# Patient Record
Sex: Female | Born: 1980
Health system: Southern US, Community
[De-identification: ages and names within clinical notes are randomized; demographics above are authoritative.]

## PROBLEM LIST (undated history)

## (undated) ENCOUNTER — Inpatient Hospital Stay (HOSPITAL_COMMUNITY): Payer: Self-pay

## (undated) DIAGNOSIS — R519 Headache, unspecified: Secondary | ICD-10-CM

## (undated) DIAGNOSIS — G5603 Carpal tunnel syndrome, bilateral upper limbs: Secondary | ICD-10-CM

## (undated) DIAGNOSIS — E785 Hyperlipidemia, unspecified: Secondary | ICD-10-CM

## (undated) DIAGNOSIS — N200 Calculus of kidney: Secondary | ICD-10-CM

## (undated) DIAGNOSIS — I10 Essential (primary) hypertension: Secondary | ICD-10-CM

## (undated) DIAGNOSIS — F419 Anxiety disorder, unspecified: Secondary | ICD-10-CM

## (undated) DIAGNOSIS — F319 Bipolar disorder, unspecified: Secondary | ICD-10-CM

## (undated) DIAGNOSIS — E7800 Pure hypercholesterolemia, unspecified: Secondary | ICD-10-CM

## (undated) DIAGNOSIS — E119 Type 2 diabetes mellitus without complications: Secondary | ICD-10-CM

## (undated) HISTORY — DX: Hyperlipidemia, unspecified: E78.5

## (undated) HISTORY — DX: Anxiety disorder, unspecified: F41.9

## (undated) HISTORY — DX: Calculus of kidney: N20.0

## (undated) HISTORY — PX: KIDNEY STONE SURGERY: SHX686

## (undated) HISTORY — DX: Carpal tunnel syndrome, bilateral upper limbs: G56.03

## (undated) HISTORY — DX: Type 2 diabetes mellitus without complications: E11.9

---

## 1998-02-16 ENCOUNTER — Other Ambulatory Visit: Admission: RE | Admit: 1998-02-16 | Discharge: 1998-02-16 | Payer: Self-pay | Admitting: Obstetrics

## 1998-03-02 ENCOUNTER — Encounter: Admission: RE | Admit: 1998-03-02 | Discharge: 1998-05-31 | Payer: Self-pay | Admitting: Obstetrics

## 1998-05-17 ENCOUNTER — Other Ambulatory Visit: Admission: RE | Admit: 1998-05-17 | Discharge: 1998-05-17 | Payer: Self-pay | Admitting: Obstetrics and Gynecology

## 1998-06-21 ENCOUNTER — Ambulatory Visit (HOSPITAL_COMMUNITY): Admission: RE | Admit: 1998-06-21 | Discharge: 1998-06-21 | Payer: Self-pay | Admitting: Obstetrics and Gynecology

## 1998-09-12 ENCOUNTER — Inpatient Hospital Stay (HOSPITAL_COMMUNITY): Admission: AD | Admit: 1998-09-12 | Discharge: 1998-09-12 | Payer: Self-pay | Admitting: Obstetrics and Gynecology

## 1998-09-15 ENCOUNTER — Inpatient Hospital Stay (HOSPITAL_COMMUNITY): Admission: AD | Admit: 1998-09-15 | Discharge: 1998-09-15 | Payer: Self-pay | Admitting: Obstetrics and Gynecology

## 1998-09-19 ENCOUNTER — Inpatient Hospital Stay (HOSPITAL_COMMUNITY): Admission: AD | Admit: 1998-09-19 | Discharge: 1998-09-20 | Payer: Self-pay | Admitting: Obstetrics and Gynecology

## 1998-09-19 ENCOUNTER — Inpatient Hospital Stay (HOSPITAL_COMMUNITY): Admission: AD | Admit: 1998-09-19 | Discharge: 1998-09-22 | Payer: Self-pay | Admitting: Obstetrics and Gynecology

## 1998-11-01 ENCOUNTER — Other Ambulatory Visit: Admission: RE | Admit: 1998-11-01 | Discharge: 1998-11-01 | Payer: Self-pay

## 2000-11-18 ENCOUNTER — Other Ambulatory Visit: Admission: RE | Admit: 2000-11-18 | Discharge: 2000-11-18 | Payer: Self-pay | Admitting: *Deleted

## 2002-05-25 ENCOUNTER — Other Ambulatory Visit: Admission: RE | Admit: 2002-05-25 | Discharge: 2002-05-25 | Payer: Self-pay | Admitting: Family Medicine

## 2003-05-04 ENCOUNTER — Emergency Department (HOSPITAL_COMMUNITY): Admission: EM | Admit: 2003-05-04 | Discharge: 2003-05-04 | Payer: Self-pay | Admitting: Emergency Medicine

## 2003-05-04 ENCOUNTER — Encounter: Payer: Self-pay | Admitting: Emergency Medicine

## 2003-05-10 ENCOUNTER — Emergency Department (HOSPITAL_COMMUNITY): Admission: EM | Admit: 2003-05-10 | Discharge: 2003-05-10 | Payer: Self-pay | Admitting: Emergency Medicine

## 2003-05-12 ENCOUNTER — Ambulatory Visit (HOSPITAL_BASED_OUTPATIENT_CLINIC_OR_DEPARTMENT_OTHER): Admission: RE | Admit: 2003-05-12 | Discharge: 2003-05-12 | Payer: Self-pay | Admitting: Urology

## 2003-05-12 ENCOUNTER — Encounter: Payer: Self-pay | Admitting: Urology

## 2003-05-21 ENCOUNTER — Encounter: Payer: Self-pay | Admitting: Emergency Medicine

## 2003-05-21 ENCOUNTER — Ambulatory Visit (HOSPITAL_COMMUNITY): Admission: EM | Admit: 2003-05-21 | Discharge: 2003-05-22 | Payer: Self-pay | Admitting: Emergency Medicine

## 2003-05-27 ENCOUNTER — Emergency Department (HOSPITAL_COMMUNITY): Admission: EM | Admit: 2003-05-27 | Discharge: 2003-05-28 | Payer: Self-pay | Admitting: Emergency Medicine

## 2003-05-28 ENCOUNTER — Encounter: Payer: Self-pay | Admitting: Emergency Medicine

## 2004-09-20 ENCOUNTER — Other Ambulatory Visit: Admission: RE | Admit: 2004-09-20 | Discharge: 2004-09-20 | Payer: Self-pay | Admitting: Family Medicine

## 2005-03-12 ENCOUNTER — Other Ambulatory Visit: Admission: RE | Admit: 2005-03-12 | Discharge: 2005-03-12 | Payer: Self-pay | Admitting: Family Medicine

## 2005-12-26 ENCOUNTER — Other Ambulatory Visit: Admission: RE | Admit: 2005-12-26 | Discharge: 2005-12-26 | Payer: Self-pay | Admitting: Obstetrics and Gynecology

## 2008-12-23 ENCOUNTER — Encounter: Payer: Self-pay | Admitting: Internal Medicine

## 2008-12-23 ENCOUNTER — Ambulatory Visit: Payer: Self-pay | Admitting: Family Medicine

## 2008-12-23 DIAGNOSIS — N949 Unspecified condition associated with female genital organs and menstrual cycle: Secondary | ICD-10-CM

## 2008-12-23 DIAGNOSIS — N925 Other specified irregular menstruation: Secondary | ICD-10-CM | POA: Insufficient documentation

## 2008-12-23 DIAGNOSIS — IMO0002 Reserved for concepts with insufficient information to code with codable children: Secondary | ICD-10-CM | POA: Insufficient documentation

## 2008-12-23 LAB — CONVERTED CEMR LAB: Beta hcg, urine, semiquantitative: POSITIVE

## 2009-07-17 ENCOUNTER — Ambulatory Visit: Payer: Self-pay | Admitting: Family Medicine

## 2009-07-17 LAB — CONVERTED CEMR LAB: Rapid Strep: NEGATIVE

## 2009-08-25 ENCOUNTER — Inpatient Hospital Stay (HOSPITAL_COMMUNITY): Admission: RE | Admit: 2009-08-25 | Discharge: 2009-08-27 | Payer: Self-pay | Admitting: Obstetrics and Gynecology

## 2009-08-30 ENCOUNTER — Ambulatory Visit: Admission: RE | Admit: 2009-08-30 | Discharge: 2009-08-30 | Payer: Self-pay | Admitting: Obstetrics and Gynecology

## 2009-12-29 ENCOUNTER — Ambulatory Visit: Payer: Self-pay | Admitting: Family Medicine

## 2009-12-29 LAB — CONVERTED CEMR LAB
Cholesterol: 219 mg/dL — ABNORMAL HIGH (ref 0–200)
Direct LDL: 171.2 mg/dL
HDL: 41.4 mg/dL (ref >39.00)
Total CHOL/HDL Ratio: 5
Triglycerides: 116 mg/dL (ref 0.0–149.0)
VLDL: 23.2 mg/dL (ref 0.0–40.0)

## 2010-01-30 ENCOUNTER — Ambulatory Visit: Payer: Self-pay | Admitting: Family Medicine

## 2010-01-30 ENCOUNTER — Emergency Department (HOSPITAL_COMMUNITY): Admission: EM | Admit: 2010-01-30 | Discharge: 2010-01-30 | Payer: Self-pay | Admitting: Emergency Medicine

## 2010-01-30 DIAGNOSIS — R1031 Right lower quadrant pain: Secondary | ICD-10-CM | POA: Insufficient documentation

## 2010-03-26 ENCOUNTER — Ambulatory Visit: Payer: Self-pay | Admitting: Family Medicine

## 2010-11-15 NOTE — Assessment & Plan Note (Signed)
Summary: RASH ON HAND? / SPREADING // RS   Vital Signs:  Patient profile:   30 year old female Weight:      180 pounds Temp:     98.7 degrees F oral BP sitting:   112 / 84  (left arm) Cuff size:   regular  Vitals Entered By: Kern Reap CMA Duncan Dull) (March 26, 2010 5:16 PM) CC: rash on hands   CC:  rash on hands.  History of Present Illness: Retta is a 30 year old, married female, who  comes in today accompanied by her newborn baby.for evaluation of a skin rash.  She says the past couple weeks.  A skin rash on her wrist and I left on.  Some of the lesions on her wrist.  She said will be small vesicles with clear fluid and a p.o. and a very pruritic.  She's had no history of previous rashes like this before.  No history of contact dermatitis.  No history of allergic rhinitis.  Allergies: No Known Drug Allergies  Social History: Reviewed history from 01/30/2010 and no changes required. 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 Apply two times a day small amts  Complete Medication List: 1)  Multi-b-plus Tabs (Multiple vitamins-minerals) .... As needed 2)  Tri-sprintec 0.18/0.215/0.25 Mg-35 Mcg Tabs (Norgestim-eth estrad triphasic) .... Once daily 3)  Triamcinolone Acetonide 0.5 % Oint (Triamcinolone acetonide) ....  Apply two times a day small amts  Patient Instructions: 1)  apply small amounts of the Lidex twice daily.  Return p.r.n. Prescriptions: TRIAMCINOLONE ACETONIDE 0.5 % OINT (TRIAMCINOLONE ACETONIDE) apply two times a day small amts  #60 gr x 2   Entered and Authorized by:   Roderick Pee MD   Signed by:   Roderick Pee MD on 03/26/2010   Method used:   Electronically to        [facility]. [phone]* (retail)       [address]       [place], [place]  [address]       Ph: [phone]       Fax: [phone]   RxID:   [id number]

## 2010-11-15 NOTE — Assessment & Plan Note (Signed)
Summary: follow up on cholesterol/pt coming in fasting/cjr   Vital Signs:  Patient profile:   30 year old female Temp:     99.0 degrees F oral BP sitting:   110 / 82  (left arm) Cuff size:   regular  Vitals Entered By: Sid Falcon LPN (December 29, 2009 8:32 AM) CC: follow-up visit   History of Present Illness: Patient here to have cholesterol check.  Her concern is that her mother died age 15 of MI. Patient does smoke less than one half pack cigarettes per day. No regular exercise yet but plans to start. No history of hypertension. Borderline gestational diabetes. Denies any chest pains or other complaints. Also has some uncles and grandparents father side of family who had coronary disease.  Allergies (verified): No Known Drug Allergies  Past History:  Past Medical History: Last updated: 12/23/2008 Kidney stones 2005  Family History: Last updated: 12/29/2009 Family History High cholesterol Mother MI age 26  Social History: Last updated: 12/23/2008 Occupation: Married Alcohol use-no ex-smoker Drug use-no  Risk Factors: Alcohol Use: 0 (12/23/2008)  Risk Factors: Smoking Status: never (12/23/2008) PMH-FH-SH reviewed for relevance  Family History: Family History High cholesterol Mother MI age 70  Review of Systems  The patient denies chest pain, syncope, dyspnea on exertion, peripheral edema, prolonged cough, headaches, hemoptysis, abdominal pain, and hematochezia.    Physical Exam  General:  Well-developed,well-nourished,in no acute distress; alert,appropriate and cooperative throughout examination Neck:  No deformities, masses, or tenderness noted. Lungs:  Normal respiratory effort, chest expands symmetrically. Lungs are clear to auscultation, no crackles or wheezes. Heart:  Normal rate and regular rhythm. S1 and S2 normal without gallop, murmur, click, rub or other extra sounds.   Impression & Recommendations:  Problem # 1:  CORONARY ARTERY DISEASE,  PREMATURE, FAMILY HX (ICD-V17.3) Needs fasting baseline lipids and will obtain today.  Councelled regarding modifiable risk factors for CAD.  Information given regarding lowering saturated/trans fats in diet. Orders: Venipuncture (16109) TLB-Lipid Panel (80061-LIPID)  Problem # 2:  PERS HX TOBACCO USE PRESENTING HAZARDS HEALTH (ICD-V15.82) Counselled regading quitting.  Complete Medication List: 1)  Multi-b-plus Tabs (Multiple vitamins-minerals) .... As needed  Patient Instructions: 1)  It is important that you exercise reguarly at least 20 minutes 5 times a week. If you develop chest pain, have severe difficulty breathing, or feel very tired, stop exercising immediately and seek medical attention.  2)  You need to lose weight. Consider a lower calorie diet and regular exercise.

## 2010-11-15 NOTE — Assessment & Plan Note (Signed)
Summary: BRAND NEW PT/TO EST/OK PER DR BURCHETTE AND DR FRY/CJR   Vital Signs:  Patient profile:   30 year old female Weight:      179 pounds BMI:     35.08 Temp:     98.1 degrees F oral Pulse rate:   84 / minute Pulse rhythm:   regular BP sitting:   98 / 76  (left arm) Cuff size:   regular  Vitals Entered By: Raechel Ache, RN (January 30, 2010 2:44 PM) CC: To Establish. Woke up today with severe abd pain; went to ER @ [facility]; had labs done. Pain gone now.   History of Present Illness: 30 yr old female to establish with [place] and to follow up on abdominal pain. She woke up early this am with the sudden onset of sharp RLQ abdominal pain that she has never felt before. Had a full work up at [facility] ER including labs and a contrasted abdominal and pelvic CT scan. Nothing was found, and specifically no kidney stones were seen and he rappendix was normal. he had no fever or nausea, no urinary or bowel changes, and her appetite has been normal. Her LMP was 01-13-10. She gave birth 5 months ago after an unremarkable pregnancy. She breast fed for 3 months and then stopped. Later this am the pain went away, and she has felt fine now all afternoon. She ate a normal lunch.   Allergies (verified): No Known Drug Allergies  Past History:  Past Medical History: Kidney stones 2005 SVD times 2  sees Dr. Ambrose Mantle for GYN exams  Past Surgical History: basket retrieval of kidney stones 2005  Family History: Reviewed history from 12/29/2009 and no changes required. Family History High cholesterol Mother MI age 37  Social History: Reviewed history from 12/23/2008 and no changes required. 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    Physical Exam  General:  Well-developed,well-nourished,in no acute distress; alert,appropriate and cooperative throughout examination Neck:  No deformities, masses, or tenderness noted. Lungs:  Normal respiratory effort, chest expands symmetrically. Lungs are clear to auscultation, no crackles or wheezes. Heart:  Normal rate and regular rhythm. S1 and S2 normal without gallop, murmur, click, rub or other extra sounds. Abdomen:  Bowel sounds positive,abdomen soft and non-tender without masses, organomegaly or hernias noted. Pulses:  R and L carotid,radial,femoral,dorsalis pedis and posterior tibial pulses are full and equal bilaterally Extremities:  No clubbing, cyanosis, edema, or deformity noted with normal full range of motion of all joints.   Neurologic:  alert & oriented X3, cranial nerves II-XII intact, and gait normal.     Impression & Recommendations:  Problem # 1:  ABDOMINAL PAIN,  RIGHT LOWER QUADRANT (ICD-789.03)  Complete Medication List: 1)  Multi-b-plus Tabs (Multiple vitamins-minerals) .... As needed 2)  Tri-sprintec 0.18/0.215/0.25 Mg-35 Mcg Tabs (Norgestim-eth estrad triphasic) .... Once daily  Patient Instructions: 1)  I think this pain was ovulation pain, and she possibly had a small ovarian cyst that ruptured. She seems fine now.  2)  Please schedule a follow-up appointment as needed .

## 2010-12-05 ENCOUNTER — Encounter: Payer: Self-pay | Admitting: Family Medicine

## 2010-12-05 ENCOUNTER — Ambulatory Visit (INDEPENDENT_AMBULATORY_CARE_PROVIDER_SITE_OTHER): Payer: Managed Care, Other (non HMO) | Admitting: Family Medicine

## 2010-12-05 DIAGNOSIS — J069 Acute upper respiratory infection, unspecified: Secondary | ICD-10-CM

## 2010-12-05 DIAGNOSIS — N760 Acute vaginitis: Secondary | ICD-10-CM

## 2010-12-05 MED ORDER — FLUCONAZOLE 150 MG PO TABS: 150.0000 mg | ORAL_TABLET | Freq: Once | ORAL | Status: DC

## 2010-12-05 NOTE — Progress Notes (Signed)
  Subjective:    Patient ID: Andrea [name], female    DOB: 11-21-1980, 30 y.o.   MRN: [id number]  HPI Here for 3 days of a bad ST with some sinus congestion and dry cough. No fever. The ST was severe for the first 24 hours, but now is getting better. Her child is getting over a similar set of symptoms.    Review of Systems  Constitutional: Negative.   HENT: Positive for sore throat, postnasal drip and sinus pressure.   Eyes: Negative.   Respiratory: Positive for cough. Negative for apnea, choking, chest tightness, shortness of breath, wheezing and stridor.        Objective:   Physical Exam  Constitutional: She appears well-developed and well-nourished. No distress.  HENT:  Head: Normocephalic and atraumatic.  Right Ear: External ear normal.  Left Ear: External ear normal.  Nose: Nose normal.  Mouth/Throat: Oropharynx is clear and moist. No oropharyngeal exudate.  Eyes: Conjunctivae and EOM are normal. Pupils are equal, round, and reactive to light.  Neck: Normal range of motion. Neck supple.  Pulmonary/Chest: Effort normal and breath sounds normal. No respiratory distress. She has no wheezes. She has no rales. She exhibits no tenderness.  Lymphadenopathy:    She has no cervical adenopathy.          Assessment & Plan:  This is viral, and she seems to be  over the worst of it already.

## 2011-01-01 LAB — BASIC METABOLIC PANEL
BUN: 16 mg/dL (ref 6–23)
CO2: 28 mEq/L (ref 19–32)
Calcium: 8.7 mg/dL (ref 8.4–10.5)
Chloride: 108 mEq/L (ref 96–112)
Creatinine, Ser: 0.8 mg/dL (ref 0.4–1.2)
GFR calc Af Amer: >60 mL/min (ref >60)
GFR calc non Af Amer: >60 mL/min (ref >60)
Glucose, Bld: 93 mg/dL (ref 70–99)
Potassium: 4.8 mEq/L (ref 3.5–5.1)
Sodium: 139 mEq/L (ref 135–145)

## 2011-01-01 LAB — URINALYSIS, ROUTINE W REFLEX MICROSCOPIC
Bilirubin Urine: NEGATIVE
Glucose, UA: NEGATIVE mg/dL
Hgb urine dipstick: NEGATIVE
Nitrite: NEGATIVE
Protein, ur: NEGATIVE mg/dL
Specific Gravity, Urine: >1.046 — ABNORMAL HIGH (ref 1.005–1.030)
Urobilinogen, UA: 0.2 mg/dL (ref 0.0–1.0)
pH: 5.5 (ref 5.0–8.0)

## 2011-01-01 LAB — CBC
HCT: 38.1 % (ref 36.0–46.0)
Hemoglobin: 12.9 g/dL (ref 12.0–15.0)
MCHC: 33.9 g/dL (ref 30.0–36.0)
MCV: 87.5 fL (ref 78.0–100.0)
Platelets: 253 10*3/uL (ref 150–400)
RBC: 4.35 MIL/uL (ref 3.87–5.11)
RDW: 13.2 % (ref 11.5–15.5)
WBC: 10.5 10*3/uL (ref 4.0–10.5)

## 2011-01-01 LAB — DIFFERENTIAL
Basophils Absolute: 0.1 10*3/uL (ref 0.0–0.1)
Basophils Relative: 1 % (ref 0–1)
Eosinophils Absolute: 0.2 10*3/uL (ref 0.0–0.7)
Eosinophils Relative: 2 % (ref 0–5)
Lymphocytes Relative: 20 % (ref 12–46)
Lymphs Abs: 2.1 10*3/uL (ref 0.7–4.0)
Monocytes Absolute: 0.7 10*3/uL (ref 0.1–1.0)
Monocytes Relative: 7 % (ref 3–12)
Neutro Abs: 7.4 10*3/uL (ref 1.7–7.7)
Neutrophils Relative %: 70 % (ref 43–77)

## 2011-01-01 LAB — POCT PREGNANCY, URINE: Preg Test, Ur: NEGATIVE

## 2011-01-16 LAB — CBC
HCT: 27.8 % — ABNORMAL LOW (ref 36.0–46.0)
HCT: 36.4 % (ref 36.0–46.0)
Hemoglobin: 12.5 g/dL (ref 12.0–15.0)
Hemoglobin: 9.6 g/dL — ABNORMAL LOW (ref 12.0–15.0)
MCHC: 34.3 g/dL (ref 30.0–36.0)
MCHC: 34.4 g/dL (ref 30.0–36.0)
MCV: 91 fL (ref 78.0–100.0)
MCV: 91.2 fL (ref 78.0–100.0)
Platelets: 151 10*3/uL (ref 150–400)
Platelets: 182 10*3/uL (ref 150–400)
RBC: 3.05 MIL/uL — ABNORMAL LOW (ref 3.87–5.11)
RBC: 4 MIL/uL (ref 3.87–5.11)
RDW: 13.3 % (ref 11.5–15.5)
RDW: 13.5 % (ref 11.5–15.5)
WBC: 10.1 10*3/uL (ref 4.0–10.5)
WBC: 10.5 10*3/uL (ref 4.0–10.5)

## 2011-01-16 LAB — RPR: RPR Ser Ql: NONREACTIVE

## 2011-03-01 NOTE — Op Note (Signed)
NAME:  Andrea [name], Andrea [name] NO.:  [id number]   MEDICAL RECORD NO.:  [id number]                   PATIENT TYPE:  EMS   LOCATION:  ED                                   FACILITY:  [facility]   PHYSICIAN:  Boston Service, M.D.             DATE OF BIRTH:  10-31-1980   DATE OF PROCEDURE:  05/21/2003  DATE OF DISCHARGE:  05/22/2003                                 OPERATIVE REPORT   PREOPERATIVE DIAGNOSIS:  Right and left ureteral calculi.   POSTOPERATIVE DIAGNOSIS:  Right and left ureteral calculi.   PROCEDURE:  1. Cystoscopy, retrograde.  2. Ureteroscopy, stone manipulation on both the right and left sides.   SURGEON:  Boston Service, M.D.   ANESTHESIA:  General.   DRAINS:  A 6 French 24 cm left double-J stent.   INDICATIONS FOR PROCEDURE:  The patient is a 30 year old female  with right  and left ureteral calculi.  A CT scan on May 04, 2003, showed left sided  hydronephrosis with a left ureteral calculus. The patient then had left  extracorporeal shock wave lithotripsy on May 12, 2003, by Excell Seltzer. Annabell Howells,  M.D. The patient later presented to the [facility] emergency room  on May 21, 2003. At that time she had right hydronephrosis with stone  visible in the right distal  ureter. The patient also had what appeared  to  be retained stony fragments in the left mid ureter. I discussed the  situation with the patient and her family members. Her pain remains  unrelieved despite intravenous Toradol and Dilaudid. The patient understands  the risks and benefits of cystoscopy retrograde and ureteroscopy and agrees  to proceed.   DESCRIPTION OF PROCEDURE:  The patient was prepped and draped in the dorsal  lithotomy position after the institution of an adequate level of general  anesthesia. A well lubricated 21 French  panendoscope was gently inserted at  the urethral meatus. There was a normal urethra and sphincter, normal  trigone and orifices.   A right retrograde showed a filling defect in the right distal  ureter with  proximal  hydronephrosis. The guide wire was negotiated beyond the stone.  The ureteroscope was inserted along the guide wire. The stone was negotiated  into the Nitinol basket and then easily withdrawn. The ureteroscope was then  reinserted. No damage to the ureter or remaining stony fragments could be  identified.   A similar technique  was used on the left side. A stone was identified in  the left mid ureter. The guide wire was negotiated alongside the indwelling  guide wire. The stone was negotiated into the Nitinol basket and then  withdrawn. The ureteroscope was reinserted. Due to prior ESWL the ureter  showed edema  and erythema. For this reason a double-J stent, 6 French, 24  cm was passed over the indwelling guide wire with excellent pigtail catheter  formation on  guide wire removal.   The bladder was drained. The cystoscope was removed. The patient was given a  B and O suppository and returned to recovery in satisfactory condition.                                               Boston Service, M.D.    RH/MEDQ  D:  05/21/2003  T:  05/22/2003  Job:  [id number]

## 2011-03-13 ENCOUNTER — Encounter: Payer: Self-pay | Admitting: Family Medicine

## 2011-03-13 ENCOUNTER — Ambulatory Visit (INDEPENDENT_AMBULATORY_CARE_PROVIDER_SITE_OTHER): Payer: Managed Care, Other (non HMO) | Admitting: Family Medicine

## 2011-03-13 VITALS — BP 106/78 | HR 104 | Temp 98.6°F | Resp 16 | Wt 177.5 lb

## 2011-03-13 DIAGNOSIS — J039 Acute tonsillitis, unspecified: Secondary | ICD-10-CM

## 2011-03-13 MED ORDER — CEPHALEXIN 500 MG PO CAPS: 500.0000 mg | ORAL_CAPSULE | Freq: Three times a day (TID) | ORAL | Status: AC

## 2011-03-13 NOTE — Progress Notes (Signed)
  Subjective:    Patient ID: Andrea [name], female    DOB: [birth year], 30 y.o.   MRN: [id number]  HPI Here for 2 days of fever, a bad ST, HA, a slight dry cough, nausea, and body aches. Taking Motrin.    Review of Systems  Constitutional: Positive for fever and fatigue.  HENT: Positive for sore throat. Negative for congestion, postnasal drip and sinus pressure.   Eyes: Negative.   Respiratory: Positive for cough.   Skin: Negative for rash.       Objective:   Physical Exam  Constitutional:       Ill appearing   HENT:  Right Ear: External ear normal.  Left Ear: External ear normal.  Nose: Nose normal.  Mouth/Throat: Oropharyngeal exudate present.       Both tonsils are red, swollen,and covered with exudate   Eyes: Conjunctivae are normal. Pupils are equal, round, and reactive to light.  Neck: Normal range of motion. Neck supple.  Pulmonary/Chest: Effort normal and breath sounds normal. No respiratory distress. She has no wheezes. She has no rales. She exhibits no tenderness.  Lymphadenopathy:    She has cervical adenopathy.          Assessment & Plan:  This is strep throat. Off work from 03-12-11 through 03-14-11

## 2011-04-22 ENCOUNTER — Encounter: Payer: Self-pay | Admitting: Internal Medicine

## 2011-04-22 ENCOUNTER — Ambulatory Visit (INDEPENDENT_AMBULATORY_CARE_PROVIDER_SITE_OTHER): Payer: Managed Care, Other (non HMO) | Admitting: Internal Medicine

## 2011-04-22 VITALS — BP 110/80 | Temp 99.2°F | Wt 183.0 lb

## 2011-04-22 DIAGNOSIS — J029 Acute pharyngitis, unspecified: Secondary | ICD-10-CM

## 2011-04-22 DIAGNOSIS — J069 Acute upper respiratory infection, unspecified: Secondary | ICD-10-CM

## 2011-04-22 LAB — POCT RAPID STREP A (OFFICE): Rapid Strep A Screen: NEGATIVE

## 2011-04-22 NOTE — Progress Notes (Signed)
  Subjective:    Patient ID: Andrea [name], female    DOB: 1981/07/23, 30 y.o.   MRN: [id number]  HPI 30 year old patient who presents with a two-day history of sore throat congestion myalgia and diarrhea. The diarrhea was more problematic earlier today but seems to be improving. Denies any vomiting or abdominal pain. Chief complaint is weakness malaise and mild sore throat. She was treated for a strep pharyngitis one month ago.      Review of Systems  Constitutional: Positive for fever, diaphoresis and fatigue.  HENT: Positive for congestion. Negative for hearing loss, sore throat, rhinorrhea, dental problem, sinus pressure and tinnitus.   Eyes: Negative for pain, discharge and visual disturbance.  Respiratory: Negative for cough and shortness of breath.   Cardiovascular: Negative for chest pain, palpitations and leg swelling.  Gastrointestinal: Positive for diarrhea. Negative for nausea, vomiting, abdominal pain, constipation, blood in stool and abdominal distention.  Genitourinary: Negative for dysuria, urgency, frequency, hematuria, flank pain, vaginal bleeding, vaginal discharge, difficulty urinating, vaginal pain and pelvic pain.  Musculoskeletal: Negative for joint swelling, arthralgias and gait problem.  Skin: Negative for rash.  Neurological: Negative for dizziness, syncope, speech difficulty, weakness, numbness and headaches.  Hematological: Negative for adenopathy.  Psychiatric/Behavioral: Negative for behavioral problems, dysphoric mood and agitation. The patient is not nervous/anxious.        Objective:   Physical Exam  Constitutional: She is oriented to person, place, and time. She appears well-developed and well-nourished.       Overweight no acute distress  HENT:  Head: Normocephalic.  Right Ear: External ear normal.  Left Ear: External ear normal.  Mouth/Throat: Oropharynx is clear and moist.       Erythema of the oropharynx  Eyes: Conjunctivae and EOM are normal.  Pupils are equal, round, and reactive to light.  Neck: Normal range of motion. Neck supple. No thyromegaly present.       No adenopathy  Cardiovascular: Normal rate, regular rhythm, normal heart sounds and intact distal pulses.   Pulmonary/Chest: Effort normal and breath sounds normal.  Abdominal: Soft. Bowel sounds are normal. She exhibits no distension and no mass. There is no tenderness. There is no rebound and no guarding.  Musculoskeletal: Normal range of motion.  Lymphadenopathy:    She has no cervical adenopathy.  Neurological: She is alert and oriented to person, place, and time.  Skin: Skin is warm and dry. No rash noted.  Psychiatric: She has a normal mood and affect. Her behavior is normal.          Assessment & Plan:  Viral URI  We'll treat symptomatically.

## 2011-04-22 NOTE — Patient Instructions (Signed)
Get plenty of rest, Drink lots of  clear liquids, and use Tylenol or ibuprofen for fever and discomfort.    Call or return to clinic prn if these symptoms worsen or fail to improve as anticipated.  

## 2011-05-29 ENCOUNTER — Ambulatory Visit: Payer: Managed Care, Other (non HMO) | Admitting: Family Medicine

## 2011-06-25 ENCOUNTER — Ambulatory Visit (INDEPENDENT_AMBULATORY_CARE_PROVIDER_SITE_OTHER): Payer: Managed Care, Other (non HMO) | Admitting: Family Medicine

## 2011-06-25 ENCOUNTER — Encounter: Payer: Self-pay | Admitting: Family Medicine

## 2011-06-25 VITALS — BP 104/68 | HR 94 | Temp 98.7°F | Wt 174.0 lb

## 2011-06-25 DIAGNOSIS — F419 Anxiety disorder, unspecified: Secondary | ICD-10-CM

## 2011-06-25 DIAGNOSIS — F341 Dysthymic disorder: Secondary | ICD-10-CM

## 2011-06-25 MED ORDER — CITALOPRAM HYDROBROMIDE 20 MG PO TABS: 20.0000 mg | ORAL_TABLET | Freq: Every day | ORAL | Status: DC

## 2011-06-25 NOTE — Progress Notes (Signed)
  Subjective:    Patient ID: Andrea [name], female    DOB: [birth year], 30 y.o.   MRN: [id number]  HPI Here for help with stress. For several years she has struggled with feelings of anxiety, some depression., of being overwhelmed, and of having a short temper. These have been getting worse lately. She does sleep well, but gets tearful frequently. She feels extremely anxious when in crowded situations like at shopping malls. She also quit smoking one week ago, and she is having a tough time dealing with this.    Review of Systems  Constitutional: Negative.   Psychiatric/Behavioral: Positive for dysphoric mood, decreased concentration and agitation. Negative for sleep disturbance. The patient is nervous/anxious.        Objective:   Physical Exam  Constitutional: She is oriented to person, place, and time. She appears well-developed and well-nourished.  Neurological: She is alert and oriented to person, place, and time.  Psychiatric: She has a normal mood and affect. Her behavior is normal. Thought content normal.          Assessment & Plan:  Try Celexa, recheck in 3 weeks

## 2011-07-16 ENCOUNTER — Ambulatory Visit: Payer: Managed Care, Other (non HMO) | Admitting: Family Medicine

## 2011-08-03 ENCOUNTER — Inpatient Hospital Stay (HOSPITAL_COMMUNITY)
Admission: EM | Admit: 2011-08-03 | Discharge: 2011-08-05 | DRG: 419 | Disposition: A | Discharge status: Home / Self Care | Payer: Managed Care, Other (non HMO) | Attending: General Surgery | Admitting: General Surgery

## 2011-08-03 ENCOUNTER — Emergency Department (HOSPITAL_COMMUNITY): Payer: Managed Care, Other (non HMO)

## 2011-08-03 DIAGNOSIS — K8010 Calculus of gallbladder with chronic cholecystitis without obstruction: Principal | ICD-10-CM | POA: Diagnosis present

## 2011-08-03 DIAGNOSIS — F3289 Other specified depressive episodes: Secondary | ICD-10-CM | POA: Diagnosis present

## 2011-08-03 DIAGNOSIS — R1011 Right upper quadrant pain: Secondary | ICD-10-CM | POA: Diagnosis present

## 2011-08-03 DIAGNOSIS — R112 Nausea with vomiting, unspecified: Secondary | ICD-10-CM | POA: Diagnosis present

## 2011-08-03 DIAGNOSIS — F329 Major depressive disorder, single episode, unspecified: Secondary | ICD-10-CM | POA: Diagnosis present

## 2011-08-03 DIAGNOSIS — E669 Obesity, unspecified: Secondary | ICD-10-CM | POA: Diagnosis present

## 2011-08-03 DIAGNOSIS — Z87442 Personal history of urinary calculi: Secondary | ICD-10-CM

## 2011-08-03 DIAGNOSIS — R110 Nausea: Secondary | ICD-10-CM

## 2011-08-03 LAB — CBC
HCT: 43.6 % (ref 36.0–46.0)
HCT: 37.7 % (ref 36.0–46.0)
Hemoglobin: 14.1 g/dL (ref 12.0–15.0)
Hemoglobin: 12.8 g/dL (ref 12.0–15.0)
MCH: 29.3 pg (ref 26.0–34.0)
MCH: 30.1 pg (ref 26.0–34.0)
MCHC: 32.3 g/dL (ref 30.0–36.0)
MCHC: 34 g/dL (ref 30.0–36.0)
MCV: 90.5 fL (ref 78.0–100.0)
MCV: 88.7 fL (ref 78.0–100.0)
Platelets: 223 10*3/uL (ref 150–400)
RBC: 4.82 MIL/uL (ref 3.87–5.11)
RBC: 4.25 MIL/uL (ref 3.87–5.11)
RDW: 12.3 % (ref 11.5–15.5)
RDW: 12.3 % (ref 11.5–15.5)
WBC: 10.5 10*3/uL (ref 4.0–10.5)

## 2011-08-03 LAB — DIFFERENTIAL
Basophils Absolute: 0 10*3/uL (ref 0.0–0.1)
Basophils Relative: 0 % (ref 0–1)
Eosinophils Absolute: 0.1 10*3/uL (ref 0.0–0.7)
Eosinophils Relative: 1 % (ref 0–5)
Lymphocytes Relative: 24 % (ref 12–46)
Lymphs Abs: 2.5 10*3/uL (ref 0.7–4.0)
Monocytes Absolute: 0.7 10*3/uL (ref 0.1–1.0)
Monocytes Relative: 6 % (ref 3–12)
Neutro Abs: 7.1 10*3/uL (ref 1.7–7.7)
Neutrophils Relative %: 68 % (ref 43–77)

## 2011-08-03 LAB — COMPREHENSIVE METABOLIC PANEL
ALT: 13 U/L (ref 0–35)
AST: 13 U/L (ref 0–37)
Albumin: 3.7 g/dL (ref 3.5–5.2)
Alkaline Phosphatase: 109 U/L (ref 39–117)
BUN: 14 mg/dL (ref 6–23)
CO2: 24 mEq/L (ref 19–32)
Calcium: 8.8 mg/dL (ref 8.4–10.5)
Chloride: 100 mEq/L (ref 96–112)
Creatinine, Ser: 0.68 mg/dL (ref 0.50–1.10)
GFR calc Af Amer: >90 mL/min (ref >90)
GFR calc non Af Amer: >90 mL/min (ref >90)
Glucose, Bld: 111 mg/dL — ABNORMAL HIGH (ref 70–99)
Potassium: 4.2 mEq/L (ref 3.5–5.1)
Sodium: 135 mEq/L (ref 135–145)
Total Bilirubin: 0.2 mg/dL — ABNORMAL LOW (ref 0.3–1.2)
Total Protein: 7.2 g/dL (ref 6.0–8.3)

## 2011-08-03 LAB — URINALYSIS, ROUTINE W REFLEX MICROSCOPIC
Bilirubin Urine: NEGATIVE
Glucose, UA: NEGATIVE mg/dL
Hgb urine dipstick: NEGATIVE
Ketones, ur: NEGATIVE mg/dL
Leukocytes, UA: NEGATIVE
Nitrite: NEGATIVE
Protein, ur: NEGATIVE mg/dL
Specific Gravity, Urine: 1.027 (ref 1.005–1.030)
Urobilinogen, UA: 0.2 mg/dL (ref 0.0–1.0)
pH: 5.5 (ref 5.0–8.0)

## 2011-08-03 LAB — GLUCOSE, CAPILLARY: Glucose-Capillary: 83 mg/dL (ref 70–99)

## 2011-08-03 LAB — PREGNANCY, URINE: Preg Test, Ur: NEGATIVE

## 2011-08-03 LAB — LIPASE, BLOOD: Lipase: 34 U/L (ref 11–59)

## 2011-08-04 ENCOUNTER — Inpatient Hospital Stay (HOSPITAL_COMMUNITY): Payer: Managed Care, Other (non HMO)

## 2011-08-04 ENCOUNTER — Other Ambulatory Visit (INDEPENDENT_AMBULATORY_CARE_PROVIDER_SITE_OTHER): Payer: Self-pay | Admitting: Surgery

## 2011-08-04 DIAGNOSIS — K8010 Calculus of gallbladder with chronic cholecystitis without obstruction: Secondary | ICD-10-CM

## 2011-08-05 HISTORY — PX: CHOLECYSTECTOMY: SHX55

## 2011-08-05 NOTE — H&P (Signed)
NAME:  Andrea [name], Andrea [name] NO.:  [id number]  MEDICAL RECORD NO.:  [id number]  LOCATION:  [facility]                         FACILITY:  [facility]  PHYSICIAN:  Velora Heckler, MD      DATE OF BIRTH:  July 01, 1981  DATE OF ADMISSION:  08/03/2011 DATE OF DISCHARGE:                             HISTORY & PHYSICAL   REFERRING PHYSICIAN:  Nelva Nay, MD emergency department.  Ruby Cola, PA-C, [facility] Emergency Department.  PRIMARY CARE PHYSICIAN:  Jeannett Senior A. Clent Ridges, MD  CHIEF COMPLAINT:  Abdominal pain, symptomatic gallstones.  HISTORY OF PRESENT ILLNESS:  The patient is a 30 year old white female, who presents to the emergency department with less than 12-hour history of abdominal pain, localized to the right upper quadrant.  The patient has had a previous episode of pain approximately 2 weeks prior to admission.  She also had an episode on the day prior to admission lasting approximately 30 minutes.  On the day of admission, the patient had onset of pain, which lasted for several hours.  This was associated with nausea.  She denies fevers or chills.  She denies jaundice or acholic stools.  The patient presented to the emergency department.  CT scan of abdomen and pelvis showed gallstones without signs of cholecystitis.  Ultrasound showed a slightly thickened gallbladder wall at 3.8 mm.  Common bile duct was 7.7 mm.  No choledocholithiasis was identified. Laboratory studies showed a normal white count of 10.5 with normal differential.  Liver function tests were normal.  General Surgery was called for management.  PAST MEDICAL HISTORY:  Nephrolithiasis, depression.  PAST SURGICAL HISTORY:  Lithotripsy, stone extraction.  MEDICATIONS:  Celexa, birth control pills.  ALLERGIES:  No known drug allergies.  SOCIAL HISTORY:  The patient is accompanied by her aunt and her sister. She has 2 children.  She does note alcohol use.  She denies tobacco use. She works for  [employer].  REVIEW OF SYSTEMS:  12-system review without significant other findings.  FAMILY HISTORY:  Noncontributory.  PHYSICAL EXAMINATION:  GENERAL:  30 year old white female on a stretcher in the emergency department.  No acute distress. VITAL SIGNS:  Temp 98.7, pulse 79, respirations 20, blood pressure 107/71, O2 saturation 95% room air. HEENT:  Shows her to be normocephalic, atraumatic.  Sclerae clear. conjunctivae clear.  Dentition fair.  Mucous membranes moist.  Voice normal. NECK:  Palpation of the neck shows no thyroid nodularity.  No lymphadenopathy.  No mass.  No tenderness. LUNGS:  Clear to auscultation bilaterally without rales, rhonchi, or wheeze. CARDIAC:  Shows regular rate and rhythm without significant murmur. Peripheral pulses are full. EXTREMITIES:  Nontender without edema. ABDOMEN:  Soft without distention.  No surgical wounds.  No tenderness to percussion.  No tenderness to palpation.  No Murphy sign.  No palpable masses.  No sign of hernia. NEUROLOGICAL:  The patient is alert and oriented without focal neurologic deficit.  LABORATORY STUDIES:  White count 10.5, hemoglobin 12.8, hematocrit 37.7%, platelet count 223,000.  Differential shows 68% neutrophils, 24% lymphocytes.  Urinalysis is benign.  Lipase normal at 34.  Urine pregnancy test negative.  Electrolytes are normal.  Liver function tests are normal  with a total bilirubin of 0.2.  RADIOGRAPHIC STUDIES:  Ultrasound of the abdomen showing cholelithiasis with gallbladder wall thickening.  CT scan of abdomen showing cholelithiasis and no sign of acute cholecystitis.  IMPRESSION: 1. Symptomatic cholelithiasis. 2. Biliary colic. 3. Obesity. 4. Depression. 5. History of nephrolithiasis.  PLAN:  I reviewed the above findings with the patient and her family.  I discussed cholelithiasis and its surgical management.  I think she is a good candidate for a laparoscopic cholecystectomy.  We  discussed the procedure.  We discussed the use of intraoperative cholangiography.  We discussed potential complications.  I offered the patient discharge from the emergency department and return for surgery on an outpatient basis in the next 1-2 weeks.  The alternative is to stay at this time for admission and surgery as time allows over the weekend.  The patient and her family desired to remain and be admitted to [facility] at this time.  Arrangements will then be made with the operating room for cholecystectomy with intraoperative cholangiography.     Velora Heckler, MD     TMG/MEDQ  D:  08/03/2011  T:  08/03/2011  Job:  [id number]  cc:   Tera Mater. Clent Ridges, MD [address] [place] [address]  Electronically Signed by Darnell Level MD on 08/05/2011 12:39:52 PM

## 2011-08-05 NOTE — Op Note (Signed)
NAMEAUBRIEE, SZETO NO.:  [id number]  MEDICAL RECORD NO.:  [id number]  LOCATION:  1527                         FACILITY:  [facility]  PHYSICIAN:  Velora Heckler, MD      DATE OF BIRTH:  July 06, 1981  DATE OF PROCEDURE:  08/04/2011                               OPERATIVE REPORT   PREOPERATIVE DIAGNOSES:  Biliary colic, cholelithiasis.  POSTOPERATIVE DIAGNOSES:  Biliary colic, cholelithiasis, chronic cholecystitis.  PROCEDURE:  Laparoscopic cholecystectomy with intraoperative cholangiography.  SURGEON:  Velora Heckler, MD, FACS  ASSISTANT:  Adolph Pollack, MD, FACS  ANESTHESIA:  General per Dr. Ronelle Nigh.  ESTIMATED BLOOD LOSS:  Minimal.  PREPARATION:  ChloraPrep.  COMPLICATIONS:  None.  INDICATIONS:  The patient is a 30 year old white female who presented to the emergency department with 12-hour history of abdominal pain in the right upper quadrant associated with nausea.  CT scan demonstrated gallstones.  Ultrasound confirmed gallstones.  Laboratory studies were normal.  The patient was admitted and prepared for surgery.  BODY OF REPORT:  Procedure was done in OR #1 at the [facility].  The patient was brought to the operating room, placed in the supine position on the operating room table.  Following administration of general anesthesia, the patient was positioned and then prepped and draped in the usual strict aseptic fashion.  After ascertaining that an adequate level of anesthesia been achieved, an infraumbilical incision was made with #15 blade.  Dissection was carried through subcutaneous tissues to the fascia.  Fascia was incised in the midline and the peritoneal cavity was entered cautiously.  0-Vicryl pursestring suture was placed in the fascia.  An Hasson cannula was introduced under direct vision and secured with a pursestring suture. Abdomen was insufflated with carbon dioxide.  Laparoscope was introduced and the  abdomen explored.  Operative ports were placed along the right costal margin in the midline, midclavicular line, and anterior axillary line.  Fundus of the gallbladder was grasped and retracted cephalad. Adhesions were taken down to the undersurface of the gallbladder and hemostasis was obtained with the electrocautery.  Dissection was begun at the neck of the gallbladder.  Peritoneum was incised.  Cystic duct was dissected out along its length.  A clip was placed at the neck of the gallbladder.  Cystic duct was incised.  Clear yellow bile emanates from the cystic duct.  A Cook cholangiography catheter was introduced through a stab wound in the right upper quadrant.  It was inserted into the cystic duct and secured with a Ligaclip.  Using C-arm fluoroscopy, real time cholangiography was performed.  There was rapid filling of a normal-caliber biliary tree.  There was reflux of contrast into the right and left hepatic ductal systems.  There was free flow distally into the duodenum without filling defect or obstruction.  Clip was withdrawn and Cook catheter was removed from the peritoneal cavity. Cystic duct was triply clipped and divided.  Cystic artery was dissected out, doubly clipped, and divided.  Posterior branch of the cystic artery was dissected out, doubly clipped, and divided.  Gallbladder was excised from the gallbladder bed using the hook electrocautery for hemostasis. Gallbladder was  completely excised and placed into an EndoCatch bag.  It was withdrawn through the umbilical port.  It contains numerous gallstones.  It was submitted to Pathology for review.  0-Vicryl pursestring suture was tied securely.  Right upper quadrant was irrigated with warm saline.  Good hemostasis was noted.  Saline was evacuated.  Ports were removed under direct vision and good hemostasis was noted at all port sites.  Port sites were anesthetized with local anesthetic.  Wounds were closed with  interrupted 4-0 Monocryl subcuticular sutures.  Wounds were washed and dried and benzoin Steri- Strips were applied.  Sterile dressings were applied.  The patient was awakened from anesthesia and brought to the recovery room.  The patient tolerated the procedure well.   Velora Heckler, MD, FACS     TMG/MEDQ  D:  08/04/2011  T:  08/04/2011  Job:  [id number]  cc:   Tera Mater. Clent Ridges, MD [address] [place] [address]  Electronically Signed by Darnell Level MD on 08/05/2011 12:40:30 PM

## 2011-08-09 ENCOUNTER — Telehealth (INDEPENDENT_AMBULATORY_CARE_PROVIDER_SITE_OTHER): Payer: Self-pay | Admitting: Surgery

## 2011-08-11 NOTE — Discharge Summary (Signed)
NAMEHAYLY, [name] NO.:  [id number]  MEDICAL RECORD NO.:  [id number]  LOCATION:  1527                         FACILITY[facility]  [facility]  PHYSICIAN:  Juanetta Gosling, MDDATE OF BIRTH:  01/23/1981  DATE OF ADMISSION:  08/03/2011 DATE OF DISCHARGE:  08/05/2011                              DISCHARGE SUMMARY   ADMISSION DIAGNOSES: 1. Biliary colic. 2. Cholelithiasis.  DISCHARGE DIAGNOSES: 1. Biliary colic. 2. Cholelithiasis. 3. Chronic cholecystitis.  ADDITIONAL DIAGNOSES: 1. Nephrolithiasis. 2. Depression.  PROCEDURES:  Laprascopic Cholcystectomy; IOC 08/04/11  PAST SURGICAL HISTORY:  Includes lithotripsy and stone extraction.  MEDICATIONS ON ADMISSION:  Include birth control pills, Celexa, and p.r.n. pain meds.  ALLERGIES:  None.  For further history and physical, please see the dictated note.  HOSPITAL COURSE:  The patient is a 30 year old white female, who presents to the emergency department with 12 hours of abdominal pain localized to the right upper quadrant.  She had previous episode 2 weeks prior to admission and on the day of admission.  She also described nausea.  She denied fever, chills, jaundice, or acholic stools.  Workup in the ER included ultrasound, which showed a thickened gallbladder at 3.8 mm.  Common bile duct was 7.7 mm.  There was no choledocholithiasis.  LABORATORY STUDIES:  Laboratory studies showed a white count of 10.5. LFTs were normal.  HOSPITAL COURSE: She was seen by Dr. Darnell Level and admitted for symptomatic cholelithiasis, biliary colic, obesity, depression, and history of nephrolithiasis.  She was taken to the operating room the following day and underwent laparoscopic cholecystectomy with intraoperative cholangiogram, which was normal.  She was transferred back to the floor and her diet has been advanced.  She had a soft regular diet this morning for breakfast.  She is able to ambulate.  She is afebrile.   Her dressings are clean and dry.  She is having flatus.  It was Dr. Doreen Salvage opinion she was ready for discharge.  We will plan to discharge her home on her preadmission medicines, which include: 1. Ibuprofen 200 mg 3 tablets q.8 hours p.r.n. 2. Celexa 10 mg daily. 3. Multivitamin 1 daily. 4. Nicotine patch daily. 5. Ortho Tri-Cyclen oral contraceptive 1 daily. 6. Tylenol 500 mg 2 tablets q.4 to 6 hours p.r.n. 7. Vitamin C one daily.  New prescriptions will be oxycodone/APAP 5/325 one to two q.4 h. p.r.n.  She is instructed to leave her dressings on until Tuesday at which time she may shower.  Remove the Steri-Strips in 1 week.  She will clean her incisions with plain soap and water.  She is instructed not to lift over 15 pounds for 2 weeks.  She may advance her diet as tolerated.  We recommend she not return to work for 2 weeks, which involves lifting over 15 pounds.  She will [facility] up at the [facility] on August 20, 2011.  CONDITION ON DISCHARGE:  Improved.     Eber Hong, P.A.   ______________________________ Juanetta Gosling, MD    WDJ/MEDQ  D:  08/05/2011  T:  08/05/2011  Job:  [id number]  cc:   Tera Mater. Clent Ridges, MD [address] [place] [address]  Electronically Signed by Sherrie George P.A. on 08/10/2011 02:58:29 PM Electronically Signed by Emelia Loron MD on 08/11/2011 08:06:41 PM

## 2011-08-12 ENCOUNTER — Telehealth (INDEPENDENT_AMBULATORY_CARE_PROVIDER_SITE_OTHER): Payer: Self-pay

## 2011-08-12 NOTE — Telephone Encounter (Signed)
Spoke with patient.  She would like to keep appointment as scheduled.

## 2011-08-20 ENCOUNTER — Encounter (INDEPENDENT_AMBULATORY_CARE_PROVIDER_SITE_OTHER): Payer: Self-pay

## 2011-08-20 ENCOUNTER — Encounter (INDEPENDENT_AMBULATORY_CARE_PROVIDER_SITE_OTHER): Payer: Self-pay | Admitting: General Surgery

## 2011-08-20 ENCOUNTER — Ambulatory Visit (INDEPENDENT_AMBULATORY_CARE_PROVIDER_SITE_OTHER): Payer: Managed Care, Other (non HMO) | Admitting: General Surgery

## 2011-08-20 VITALS — BP 116/82 | HR 64 | Temp 96.8°F | Resp 20 | Ht 61.0 in | Wt 175.5 lb

## 2011-08-20 DIAGNOSIS — K8010 Calculus of gallbladder with chronic cholecystitis without obstruction: Secondary | ICD-10-CM

## 2011-08-20 NOTE — Progress Notes (Signed)
Andrea [name] November 03, 1980 [id number] 08/20/2011   Andrea [name] is a 30 y.o. female who had a laparoscopic cholecystectomy with intraoperative cholangiogram 08-05-11 Dr. Dwain Sarna.  The pathology report confirmed Chronic cholelithiasis, cholecystitis..  The patient reports that they are feeling well with normal bowel movements and good appetite.  The pre-operative symptoms of abdominal pain, nausea, and vomiting have resolved.    Physical examination - Incisions appear well-healed with no sign of infection or bleeding.   Abdomen - soft, non-tender complains of some cramping and loose stools.  Cannot sleep on comfortably on stomach.  Impression:  s/p laparoscopic cholecystectomy  Plan:  She may resume a regular diet and full activity.  She may follow-up on a PRN basis.

## 2011-08-20 NOTE — Patient Instructions (Signed)
You can return to work and call if you have any problems.

## 2011-08-23 ENCOUNTER — Encounter (INDEPENDENT_AMBULATORY_CARE_PROVIDER_SITE_OTHER): Payer: Self-pay

## 2011-08-27 ENCOUNTER — Telehealth (INDEPENDENT_AMBULATORY_CARE_PROVIDER_SITE_OTHER): Payer: Self-pay | Admitting: General Surgery

## 2012-04-20 ENCOUNTER — Ambulatory Visit: Payer: Managed Care, Other (non HMO) | Admitting: Family Medicine

## 2012-04-20 ENCOUNTER — Telehealth: Payer: Self-pay | Admitting: Family Medicine

## 2012-04-20 NOTE — Telephone Encounter (Signed)
Pt called and has to rsc her ov that she had for today 04/20/12 at 3:45pm, because she had to work unexpectedly. Pt rsc for 04/27/12 at 3:30pm. Pt req that late cx fee be waived.

## 2012-04-21 NOTE — Telephone Encounter (Signed)
Appt cx.

## 2012-04-21 NOTE — Telephone Encounter (Signed)
Please cancel the no show fee, thanks

## 2012-04-22 ENCOUNTER — Ambulatory Visit: Payer: Managed Care, Other (non HMO) | Admitting: Family Medicine

## 2012-04-27 ENCOUNTER — Ambulatory Visit: Payer: Managed Care, Other (non HMO) | Admitting: Family Medicine

## 2012-06-16 ENCOUNTER — Ambulatory Visit (INDEPENDENT_AMBULATORY_CARE_PROVIDER_SITE_OTHER): Payer: Managed Care, Other (non HMO) | Admitting: Family Medicine

## 2012-06-16 ENCOUNTER — Encounter: Payer: Self-pay | Admitting: Family Medicine

## 2012-06-16 VITALS — BP 140/90 | HR 100 | Temp 98.3°F | Wt 181.0 lb

## 2012-06-16 DIAGNOSIS — J329 Chronic sinusitis, unspecified: Secondary | ICD-10-CM

## 2012-06-16 DIAGNOSIS — N760 Acute vaginitis: Secondary | ICD-10-CM

## 2012-06-16 MED ORDER — AZITHROMYCIN 250 MG PO TABS: ORAL_TABLET | ORAL | Status: AC

## 2012-06-16 MED ORDER — ESCITALOPRAM OXALATE 10 MG PO TABS: 10.0000 mg | ORAL_TABLET | Freq: Every day | ORAL | Status: DC

## 2012-06-16 MED ORDER — FLUCONAZOLE 150 MG PO TABS: 150.0000 mg | ORAL_TABLET | Freq: Once | ORAL | Status: AC

## 2012-06-16 NOTE — Progress Notes (Signed)
  Subjective:    Patient ID: Andrea [name], female    DOB: 05/05/1981, 31 y.o.   MRN: [id number]  HPI Here for 4 days of sinus pressure, HA, ST, a dry cough, body aches, and diarrhea. No nausea or vomiting. No fever.    Review of Systems  Constitutional: Negative.   HENT: Positive for congestion, postnasal drip and sinus pressure.   Eyes: Negative.   Respiratory: Positive for cough.   Gastrointestinal: Positive for diarrhea. Negative for nausea, vomiting, abdominal pain, constipation, blood in stool and abdominal distention.       Objective:   Physical Exam  Constitutional: She appears well-developed and well-nourished.  HENT:  Right Ear: External ear normal.  Left Ear: External ear normal.  Nose: Nose normal.  Mouth/Throat: Oropharynx is clear and moist.  Eyes: Conjunctivae are normal.  Pulmonary/Chest: Effort normal and breath sounds normal.  Lymphadenopathy:    She has no cervical adenopathy.          Assessment & Plan:  Drink fluids, use Mucinex. Off work today and tomorrow.

## 2012-09-23 ENCOUNTER — Ambulatory Visit (INDEPENDENT_AMBULATORY_CARE_PROVIDER_SITE_OTHER): Payer: BC Managed Care – PPO | Admitting: Family Medicine

## 2012-09-23 DIAGNOSIS — Z23 Encounter for immunization: Secondary | ICD-10-CM

## 2012-10-08 ENCOUNTER — Telehealth: Payer: Self-pay | Admitting: Family Medicine

## 2012-10-08 NOTE — Telephone Encounter (Signed)
Spoke to pt told her when she is ready for refills just let us know and we can then send to [facility] for her. Pt verbalized understanding.

## 2012-10-08 NOTE — Telephone Encounter (Signed)
Patient called stating that she will be switching to [facility] mail order pharmacy and is not due for refills and would like advice about what to do. Please assist.

## 2012-10-28 ENCOUNTER — Encounter: Payer: Self-pay | Admitting: Family Medicine

## 2012-10-28 ENCOUNTER — Ambulatory Visit (INDEPENDENT_AMBULATORY_CARE_PROVIDER_SITE_OTHER): Payer: BC Managed Care – PPO | Admitting: Family Medicine

## 2012-10-28 VITALS — BP 114/78 | HR 116 | Temp 98.8°F | Wt 190.0 lb

## 2012-10-28 DIAGNOSIS — J029 Acute pharyngitis, unspecified: Secondary | ICD-10-CM

## 2012-10-28 DIAGNOSIS — J111 Influenza due to unidentified influenza virus with other respiratory manifestations: Secondary | ICD-10-CM

## 2012-10-28 LAB — POCT RAPID STREP A (OFFICE): Rapid Strep A Screen: NEGATIVE

## 2012-10-28 MED ORDER — OSELTAMIVIR PHOSPHATE 75 MG PO CAPS: 75.0000 mg | ORAL_CAPSULE | Freq: Two times a day (BID) | ORAL | Status: DC

## 2012-10-28 NOTE — Progress Notes (Signed)
  Subjective:    Patient ID: Andrea [name], female    DOB: 01-Dec-1980, 32 y.o.   MRN: [id number]  HPI Here for 2 days of fevers, body aches, HA, ST, dry coughing, and diarrhea.    Review of Systems  Constitutional: Positive for fever and fatigue.  HENT: Positive for sore throat. Negative for postnasal drip and sinus pressure.   Eyes: Negative.   Respiratory: Positive for cough.   Gastrointestinal: Positive for diarrhea. Negative for nausea, vomiting, abdominal pain, constipation, blood in stool and abdominal distention.       Objective:   Physical Exam  Constitutional: She appears well-developed and well-nourished.  HENT:  Right Ear: External ear normal.  Left Ear: External ear normal.  Nose: Nose normal.  Mouth/Throat: No oropharyngeal exudate.       Posterior OP is red without exudate  Eyes: Conjunctivae normal are normal.  Neck: Neck supple. No thyromegaly present.  Pulmonary/Chest: Effort normal and breath sounds normal.  Lymphadenopathy:    She has no cervical adenopathy.          Assessment & Plan:  Her rapid strep is negative. She has influenza. Try Tamiflu, rest, fluids, Motrin. Out of work from 10-26-12 until 10-30-12.

## 2012-10-28 NOTE — Addendum Note (Signed)
Addended by: Gershon Crane A on: 10/28/2012 03:45 PM   Modules accepted: Orders

## 2012-10-30 ENCOUNTER — Telehealth: Payer: Self-pay | Admitting: Family Medicine

## 2012-10-30 NOTE — Telephone Encounter (Signed)
Per Dr. Clent Ridges, okay to give note, this is ready and I spoke with pt.

## 2012-10-30 NOTE — Telephone Encounter (Signed)
Pt needs another work note from 10-26-12 thru 10-30-2012. Pt was unable to go to work today. Pt was dx flu

## 2012-11-29 ENCOUNTER — Other Ambulatory Visit: Payer: Self-pay | Admitting: Family Medicine

## 2013-03-24 ENCOUNTER — Other Ambulatory Visit: Payer: Self-pay | Admitting: Family Medicine

## 2013-03-24 NOTE — Telephone Encounter (Signed)
Okay for one year  

## 2013-04-22 ENCOUNTER — Encounter (HOSPITAL_COMMUNITY): Payer: Self-pay

## 2013-04-22 ENCOUNTER — Other Ambulatory Visit: Payer: Self-pay

## 2013-04-22 ENCOUNTER — Emergency Department (HOSPITAL_COMMUNITY): Payer: BC Managed Care – PPO

## 2013-04-22 DIAGNOSIS — F411 Generalized anxiety disorder: Secondary | ICD-10-CM | POA: Insufficient documentation

## 2013-04-22 DIAGNOSIS — Z8639 Personal history of other endocrine, nutritional and metabolic disease: Secondary | ICD-10-CM | POA: Insufficient documentation

## 2013-04-22 DIAGNOSIS — Z79899 Other long term (current) drug therapy: Secondary | ICD-10-CM | POA: Insufficient documentation

## 2013-04-22 DIAGNOSIS — Z862 Personal history of diseases of the blood and blood-forming organs and certain disorders involving the immune mechanism: Secondary | ICD-10-CM | POA: Insufficient documentation

## 2013-04-22 DIAGNOSIS — R0602 Shortness of breath: Secondary | ICD-10-CM | POA: Insufficient documentation

## 2013-04-22 DIAGNOSIS — Z87442 Personal history of urinary calculi: Secondary | ICD-10-CM | POA: Insufficient documentation

## 2013-04-22 DIAGNOSIS — F17200 Nicotine dependence, unspecified, uncomplicated: Secondary | ICD-10-CM | POA: Insufficient documentation

## 2013-04-22 DIAGNOSIS — R0789 Other chest pain: Secondary | ICD-10-CM | POA: Insufficient documentation

## 2013-04-22 LAB — CBC
HCT: 39.3 % (ref 36.0–46.0)
Hemoglobin: 13.6 g/dL (ref 12.0–15.0)
MCH: 30.4 pg (ref 26.0–34.0)
MCHC: 34.6 g/dL (ref 30.0–36.0)
MCV: 87.7 fL (ref 78.0–100.0)
Platelets: 246 10*3/uL (ref 150–400)
RBC: 4.48 MIL/uL (ref 3.87–5.11)
RDW: 12.6 % (ref 11.5–15.5)
WBC: 12.3 10*3/uL — ABNORMAL HIGH (ref 4.0–10.5)

## 2013-04-22 LAB — PRO B NATRIURETIC PEPTIDE: Pro B Natriuretic peptide (BNP): 24.2 pg/mL (ref 0–125)

## 2013-04-22 LAB — POCT I-STAT TROPONIN I: Troponin i, poc: 0.01 ng/mL (ref 0.00–0.08)

## 2013-04-22 LAB — BASIC METABOLIC PANEL
BUN: 15 mg/dL (ref 6–23)
CO2: 26 mEq/L (ref 19–32)
Calcium: 9 mg/dL (ref 8.4–10.5)
Chloride: 102 mEq/L (ref 96–112)
Creatinine, Ser: 0.8 mg/dL (ref 0.50–1.10)
GFR calc Af Amer: >90 mL/min (ref >90)
GFR calc non Af Amer: >90 mL/min (ref >90)
Glucose, Bld: 143 mg/dL — ABNORMAL HIGH (ref 70–99)
Potassium: 3.8 mEq/L (ref 3.5–5.1)
Sodium: 138 mEq/L (ref 135–145)

## 2013-04-22 NOTE — ED Notes (Signed)
Pt c/o Right side chest pain, SOB, dizziness, and nausea starting approx 2000 this pm while walking her dog. Pt reports Right hand numbness 2 days ago but denies it today. Pt denies abd/back pain, vomiting, or diaphoresis

## 2013-04-23 ENCOUNTER — Emergency Department (HOSPITAL_COMMUNITY)
Admission: EM | Admit: 2013-04-23 | Discharge: 2013-04-23 | Disposition: A | Discharge status: Home / Self Care | Payer: BC Managed Care – PPO | Attending: Emergency Medicine | Admitting: Emergency Medicine

## 2013-04-23 ENCOUNTER — Telehealth (HOSPITAL_COMMUNITY): Payer: Self-pay | Admitting: *Deleted

## 2013-04-23 DIAGNOSIS — R079 Chest pain, unspecified: Secondary | ICD-10-CM

## 2013-04-23 HISTORY — DX: Pure hypercholesterolemia, unspecified: E78.00

## 2013-04-23 LAB — POCT I-STAT TROPONIN I: Troponin i, poc: 0 ng/mL (ref 0.00–0.08)

## 2013-04-23 LAB — D-DIMER, QUANTITATIVE: D-Dimer, Quant: <0.27 ug/mL-FEU (ref 0.00–0.48)

## 2013-04-23 NOTE — ED Provider Notes (Signed)
History    CSN: [id number] Arrival date & time 04/22/13  2150  First MD Initiated Contact with Patient 04/23/13 0244     Chief Complaint  Patient presents with  . Chest Pain   (Consider location/radiation/quality/duration/timing/severity/associated sxs/prior Treatment) HPI Hx per PT - R sided CP and SOB, squeezing, onset around 7:30pm while walking the dog. Pain was 7-8/10, lasted about 90 minutes. She took ASA at home and presented here. No pain since that time. Asymptomatic in the ED. No leg pain or swelling, no h/o same. No diaphoresis, no N/V. No radiation of pain. Has FH of CAD.  Past Medical History  Diagnosis Date  . Anxiety   . Kidney stones   . Hyperlipidemia   . Abdominal pain   . High cholesterol    Past Surgical History  Procedure Laterality Date  . Cholecystectomy  08/05/11  . Kidney stone surgery     Family History  Problem Relation Age of Onset  . Heart attack Mother   . Hypertension Father    History  Substance Use Topics  . Smoking status: Current Every Day Smoker    Last Attempt to Quit: 10/14/2008  . Smokeless tobacco: Never Used  . Alcohol Use: 0.0 oz/week     Comment: rare   OB History   Grav Para Term Preterm Abortions TAB SAB Ect Mult Living                 Review of Systems  Constitutional: Negative for fever and chills.  HENT: Negative for neck pain and neck stiffness.   Eyes: Negative for pain.  Respiratory: Positive for shortness of breath.   Cardiovascular: Positive for chest pain.  Gastrointestinal: Negative for abdominal pain.  Genitourinary: Negative for dysuria.  Musculoskeletal: Negative for back pain.  Skin: Negative for rash.  Neurological: Negative for headaches.  All other systems reviewed and are negative.    Allergies  Review of patient's allergies indicates no known allergies.  Home Medications   Current Outpatient Rx  Name  Route  Sig  Dispense  Refill  . escitalopram (LEXAPRO) 10 MG tablet      TAKE 1  TABLET BY MOUTH EVERY DAY   30 tablet   11   . norethindrone-ethinyl estradiol (TRIPHASIL) 0.5/0.75/1-35 MG-MCG per tablet   Oral   Take 1 tablet by mouth daily.            BP 109/70  Pulse 88  Temp(Src) 99.1 F (37.3 C) (Oral)  Resp 20  SpO2 99%  LMP 04/01/2013 Physical Exam  Constitutional: She is oriented to person, place, and time. She appears well-developed and well-nourished.  HENT:  Head: Normocephalic and atraumatic.  Eyes: Conjunctivae and EOM are normal. Pupils are equal, round, and reactive to light.  Neck: Trachea normal. Neck supple. No thyromegaly present.  Cardiovascular: Normal rate, regular rhythm, S1 normal, S2 normal and normal pulses.     No systolic murmur is present   No diastolic murmur is present  Pulses:      Radial pulses are 2+ on the right side, and 2+ on the left side.  Pulmonary/Chest: Effort normal and breath sounds normal. She has no wheezes. She has no rhonchi. She has no rales. She exhibits no tenderness.  Abdominal: Soft. Normal appearance and bowel sounds are normal. There is no tenderness. There is no CVA tenderness and negative Murphy's sign.  Musculoskeletal:  calves nontender, no cords or erythema, negative Homans sign  Neurological: She is alert and oriented to  person, place, and time. She has normal strength. No cranial nerve deficit or sensory deficit. GCS eye subscore is 4. GCS verbal subscore is 5. GCS motor subscore is 6.  Skin: Skin is warm and dry. No rash noted. She is not diaphoretic.  Psychiatric: Her speech is normal.  Cooperative and appropriate    ED Course  Procedures (including critical care time)  Results for orders placed during the hospital encounter of 04/23/13  CBC      Result Value Range   WBC 12.3 (*) 4.0 - 10.5 K/uL   RBC 4.48  3.87 - 5.11 MIL/uL   Hemoglobin 13.6  12.0 - 15.0 g/dL   HCT 87.5  64.3 - 32.9 %   MCV 87.7  78.0 - 100.0 fL   MCH 30.4  26.0 - 34.0 pg   MCHC 34.6  30.0 - 36.0 g/dL   RDW 51.8   84.1 - 66.0 %   Platelets 246  150 - 400 K/uL  BASIC METABOLIC PANEL      Result Value Range   Sodium 138  135 - 145 mEq/L   Potassium 3.8  3.5 - 5.1 mEq/L   Chloride 102  96 - 112 mEq/L   CO2 26  19 - 32 mEq/L   Glucose, Bld 143 (*) 70 - 99 mg/dL   BUN 15  6 - 23 mg/dL   Creatinine, Ser 6.30  0.50 - 1.10 mg/dL   Calcium 9.0  8.4 - 16.0 mg/dL   GFR calc non Af Amer >90  >90 mL/min   GFR calc Af Amer >90  >90 mL/min  PRO B NATRIURETIC PEPTIDE      Result Value Range   Pro B Natriuretic peptide (BNP) 24.2  0 - 125 pg/mL  D-DIMER, QUANTITATIVE      Result Value Range   D-Dimer, Quant <0.27  0.00 - 0.48 ug/mL-FEU  POCT I-STAT TROPONIN I      Result Value Range   Troponin i, poc 0.01  0.00 - 0.08 ng/mL   Comment 3           POCT I-STAT TROPONIN I      Result Value Range   Troponin i, poc 0.00  0.00 - 0.08 ng/mL   Comment 3            Dg Chest 2 View  04/22/2013   *RADIOLOGY REPORT*  Clinical Data: Chest pain tonight.  Smoker.  CHEST - 2 VIEW  Comparison: None.  Findings: The heart size and pulmonary vascularity are normal. The lungs appear clear and expanded without focal air space disease or consolidation. No blunting of the costophrenic angles.  No pneumothorax.  Mediastinal contours appear intact.  Surgical clips in the right upper quadrant.  IMPRESSION: No evidence of active pulmonary disease.   Original Report Authenticated By: Burman Nieves, M.D.      Date: 04/23/2013  Rate: 110  Rhythm: sinus tachycardia  QRS Axis: normal  Intervals: normal  ST/T Wave abnormalities: nonspecific ST changes  Conduction Disutrbances:none  Narrative Interpretation:   Old EKG Reviewed: none available   ASA PTA  Asymptomatic in ED  Plan d/c home, outpatient stress test, cardiology follow up. CP precauitons provided MDM  CP/ SOB  ECG CXR Labs including serial troponins, neg d-dimer VS and nurses notes reviewed   Sunnie Nielsen, MD 04/23/13 [phone]

## 2013-04-30 ENCOUNTER — Encounter: Payer: Self-pay | Admitting: Cardiovascular Disease

## 2013-04-30 ENCOUNTER — Ambulatory Visit (INDEPENDENT_AMBULATORY_CARE_PROVIDER_SITE_OTHER): Payer: BC Managed Care – PPO | Admitting: Cardiovascular Disease

## 2013-04-30 VITALS — BP 106/68 | HR 96 | Resp 16 | Ht 61.0 in | Wt 197.6 lb

## 2013-04-30 DIAGNOSIS — E669 Obesity, unspecified: Secondary | ICD-10-CM

## 2013-04-30 DIAGNOSIS — F17200 Nicotine dependence, unspecified, uncomplicated: Secondary | ICD-10-CM

## 2013-04-30 DIAGNOSIS — Z87891 Personal history of nicotine dependence: Secondary | ICD-10-CM

## 2013-04-30 DIAGNOSIS — G4733 Obstructive sleep apnea (adult) (pediatric): Secondary | ICD-10-CM

## 2013-04-30 DIAGNOSIS — Z720 Tobacco use: Secondary | ICD-10-CM

## 2013-04-30 DIAGNOSIS — R079 Chest pain, unspecified: Secondary | ICD-10-CM

## 2013-04-30 NOTE — Patient Instructions (Addendum)
Your physician has requested that you have en exercise stress myoview. For further information please visit [email or web address] Please follow instruction sheet, as given.  Your physician has recommended that you have a sleep study. This test records several body functions during sleep, including: brain activity, eye movement, oxygen and carbon dioxide blood levels, heart rate and rhythm, breathing rate and rhythm, the flow of air through your mouth and nose, snoring, body muscle movements, and chest and belly movement.  Please bring old lab reports to next visit.  Your physician recommends that you schedule a follow-up appointment after stress test.  Your physician discussed the hazards of tobacco use. Tobacco use cessation is recommended and techniques and options to help you quit were discussed.

## 2013-05-02 DIAGNOSIS — G4733 Obstructive sleep apnea (adult) (pediatric): Secondary | ICD-10-CM | POA: Insufficient documentation

## 2013-05-02 DIAGNOSIS — Z720 Tobacco use: Secondary | ICD-10-CM | POA: Insufficient documentation

## 2013-05-02 DIAGNOSIS — E669 Obesity, unspecified: Secondary | ICD-10-CM | POA: Insufficient documentation

## 2013-05-02 DIAGNOSIS — R079 Chest pain, unspecified: Secondary | ICD-10-CM | POA: Insufficient documentation

## 2013-05-02 DIAGNOSIS — R0789 Other chest pain: Secondary | ICD-10-CM | POA: Insufficient documentation

## 2013-05-02 NOTE — Assessment & Plan Note (Signed)
Clinical picture suggests a high likelihood of obstructive sleep apnea. She should have a sleep study.

## 2013-05-02 NOTE — Assessment & Plan Note (Signed)
Her symptoms are definitely compatible with angina pectoris, despite her young age. She has numerous coronary risk factors. She requires a minimum of a stress test. I have recommended a stress myocardial perfusion study

## 2013-05-02 NOTE — Assessment & Plan Note (Signed)
Regardless of whether her current symptoms are due to coronary disease, she is at very high risk of complications from smoking when one takes into account her strong family history, her personal history of hypercholesterolemia and the active use of oral contraceptives. Indeed just the latter would put her at high risk of myocardial infarction, stroke and pulmonary embolism. I've recommended that she attempt to quit smoking as soon as possible. She should not take estrogen-based contraceptives only she quit smoking. More than 10 minutes were spent discussing the importance of smoking cessation and ways to achieve this goal.

## 2013-05-02 NOTE — Progress Notes (Signed)
Patient ID: Andrea [name], female   DOB: [birth year], 32 y.o.   MRN: [id number]      Reason for office visit Chest pain  This is the patient's first ever cardiology evaluation. She was walking her dog (who is young and strong) and developed a squeezing sensation in the upper chest. This became fairly severe and she had to stop. It persisted for many minutes after she stopped exerting herself.  She is quite young and is premenopausal. However she has numerous coronary risk factors including active tobacco use, strong family history of premature CAD (mother died of a heart attack at age 61, maternal uncle had his first heart attack at age 59, paternal grandfather died at age 58 of a heart attack, paternal uncle had a heart attack at age 71, paternal aunt has had heart disease. She has been told that her cholesterol is high but has not ever seek treatment for it. She does not have diabetes or hypertension.  Similar but less severe chest pressure has occurred on other occasions. Otherwise she denies cardiovascular complaints. Of note she is taking an estrogen containing oral contraceptive despite the fact that she smokes  Andrea [name] also describes numerous symptoms consistent with obstructive sleep apnea, is a loud snorer, and scores 13 points on the Epworth scale. One of her friends who observed her sleep told her that she was terrified that she may stop breathing permanently.    No Known Allergies  Current Outpatient Prescriptions  Medication Sig Dispense Refill  . escitalopram (LEXAPRO) 10 MG tablet TAKE 1 TABLET BY MOUTH EVERY DAY  30 tablet  11  . TRINESSA, 28, 0.18/0.215/0.25 MG-35 MCG tablet Take 1 tablet by mouth daily.       No current facility-administered medications for this visit.    Past Medical History  Diagnosis Date  . Anxiety   . Kidney stones   . Hyperlipidemia   . Abdominal pain   . High cholesterol     Past Surgical History  Procedure Laterality Date  .  Cholecystectomy  08/05/11  . Kidney stone surgery      Family History  Problem Relation Age of Onset  . Heart attack Mother   . Hypertension Father   . Heart disease Maternal Uncle   . Heart attack Maternal Uncle   . Heart attack Paternal Aunt     During a stress test  . Heart attack Paternal Uncle   . Heart attack Paternal Grandfather     History   Social History  . Marital Status: Married    Spouse Name: N/A    Number of Children: N/A  . Years of Education: N/A   Occupational History  . Not on file.   Social History Main Topics  . Smoking status: Current Every Day Smoker -- 1.00 packs/day for 19 years    Types: Cigarettes  . Smokeless tobacco: Never Used  . Alcohol Use: 0.0 oz/week     Comment: rare  . Drug Use: No  . Sexually Active: Yes    Birth Control/ Protection: Pill   Other Topics Concern  . Not on file   Social History Narrative  . No narrative on file    Review of systems: The patient specifically denies any chest pain at rest, dyspnea at rest or with exertion, orthopnea, paroxysmal nocturnal dyspnea, syncope, palpitations, focal neurological deficits, intermittent claudication, lower extremity edema, unexplained weight gain, cough, hemoptysis or wheezing.  The patient also denies abdominal pain, nausea, vomiting, dysphagia, diarrhea, constipation,  polyuria, polydipsia, dysuria, hematuria, frequency, urgency, abnormal bleeding or bruising, fever, chills, unexpected weight changes, mood swings, change in skin or hair texture, change in voice quality, auditory or visual problems, allergic reactions or rashes, new musculoskeletal complaints other than usual "aches and pains".  She does have poor sleep, restless sleep, wakes up feeling tired, daytime somnolence with frequent napping, heavy snoring.   PHYSICAL EXAM BP 106/68  Pulse 96  Resp 16  Ht 5\' 1"  (1.549 m)  Wt 197 lb 9.6 oz (89.631 kg)  BMI 37.36 kg/m2  LMP 04/01/2013  General: Alert, oriented  x3, no distress, moderately obese Head: no evidence of trauma, PERRL, EOMI, no exophtalmos or lid lag, no myxedema, no xanthelasma; normal ears, nose and oropharynx Neck: normal jugular venous pulsations and no hepatojugular reflux; brisk carotid pulses without delay and no carotid bruits Chest: clear to auscultation, no signs of consolidation by percussion or palpation, normal fremitus, symmetrical and full respiratory excursions Cardiovascular: normal position and quality of the apical impulse, regular rhythm, normal first and second heart sounds, no murmurs, rubs or gallops Abdomen: no tenderness or distention, no masses by palpation, no abnormal pulsatility or arterial bruits, normal bowel sounds, no hepatosplenomegaly Extremities: no clubbing, cyanosis or edema; 2+ radial, ulnar and brachial pulses bilaterally; 2+ right femoral, posterior tibial and dorsalis pedis pulses; 2+ left femoral, posterior tibial and dorsalis pedis pulses; no subclavian or femoral bruits Neurological: grossly nonfocal   EKG: Normal sinus rhythm, normal  Lipid Panel     Component Value Date/Time   CHOL 219* 12/29/2009 0851   TRIG 116.0 12/29/2009 0851   HDL 41.40 12/29/2009 0851   CHOLHDL 5 12/29/2009 0851   VLDL 23.2 12/29/2009 0851    BMET    Component Value Date/Time   NA 138 04/22/2013 2205   K 3.8 04/22/2013 2205   CL 102 04/22/2013 2205   CO2 26 04/22/2013 2205   GLUCOSE 143* 04/22/2013 2205   BUN 15 04/22/2013 2205   CREATININE 0.80 04/22/2013 2205   CALCIUM 9.0 04/22/2013 2205   GFRNONAA >90 04/22/2013 2205   GFRAA >90 04/22/2013 2205     ASSESSMENT AND PLAN Chest pain on exertion Her symptoms are definitely compatible with angina pectoris, despite her young age. She has numerous coronary risk factors. She requires a minimum of a stress test. I have recommended a stress myocardial perfusion study  Obstructive sleep apnea Clinical picture suggests a high likelihood of obstructive sleep apnea. She  should have a sleep study.  Tobacco abuse Regardless of whether her current symptoms are due to coronary disease, she is at very high risk of complications from smoking when one takes into account her strong family history, her personal history of hypercholesterolemia and the active use of oral contraceptives. Indeed just the latter would put her at high risk of myocardial infarction, stroke and pulmonary embolism. I've recommended that she attempt to quit smoking as soon as possible. She should not take estrogen-based contraceptives only she quit smoking. More than 10 minutes were spent discussing the importance of smoking cessation and ways to achieve this goal.  Obesity (BMI 35.0-39.9 without comorbidity)     Orders Placed This Encounter  Procedures  . Myocardial Perfusion Imaging  . EKG 12-Lead  . Split night study   Meds ordered this encounter  Medications  . TRINESSA, 28, 0.18/0.215/0.25 MG-35 MCG tablet    Sig: Take 1 tablet by mouth daily.    Andrea [name]  Thurmon Fair, MD, [facility] (  [phone] office [phone] pager

## 2013-05-07 ENCOUNTER — Ambulatory Visit (HOSPITAL_COMMUNITY)
Admission: RE | Admit: 2013-05-07 | Discharge: 2013-05-07 | Disposition: A | Discharge status: Home / Self Care | Payer: BC Managed Care – PPO | Source: Ambulatory Visit | Attending: Cardiovascular Disease | Admitting: Cardiovascular Disease

## 2013-05-07 DIAGNOSIS — Z8249 Family history of ischemic heart disease and other diseases of the circulatory system: Secondary | ICD-10-CM | POA: Insufficient documentation

## 2013-05-07 DIAGNOSIS — R079 Chest pain, unspecified: Secondary | ICD-10-CM | POA: Insufficient documentation

## 2013-05-07 DIAGNOSIS — R0989 Other specified symptoms and signs involving the circulatory and respiratory systems: Secondary | ICD-10-CM | POA: Insufficient documentation

## 2013-05-07 DIAGNOSIS — F17200 Nicotine dependence, unspecified, uncomplicated: Secondary | ICD-10-CM | POA: Insufficient documentation

## 2013-05-07 DIAGNOSIS — R5381 Other malaise: Secondary | ICD-10-CM | POA: Insufficient documentation

## 2013-05-07 DIAGNOSIS — R0609 Other forms of dyspnea: Secondary | ICD-10-CM | POA: Insufficient documentation

## 2013-05-07 DIAGNOSIS — E669 Obesity, unspecified: Secondary | ICD-10-CM | POA: Insufficient documentation

## 2013-05-07 DIAGNOSIS — R42 Dizziness and giddiness: Secondary | ICD-10-CM | POA: Insufficient documentation

## 2013-05-07 DIAGNOSIS — R002 Palpitations: Secondary | ICD-10-CM | POA: Insufficient documentation

## 2013-05-07 MED ORDER — TECHNETIUM TC 99M SESTAMIBI GENERIC - CARDIOLITE
30.9000 | Freq: Once | INTRAVENOUS | Status: AC | PRN
  Administered 2013-05-07: 30.9 via INTRAVENOUS

## 2013-05-07 MED ORDER — TECHNETIUM TC 99M SESTAMIBI GENERIC - CARDIOLITE
10.3000 | Freq: Once | INTRAVENOUS | Status: AC | PRN
  Administered 2013-05-07: 10 via INTRAVENOUS

## 2013-05-07 NOTE — Procedures (Addendum)
[facility] [address] [place] [address] [place] [address] [phone]  Cardiology Nuclear Med Andrea [name] is a 32 y.o. female     MRN : [id number]     DOB: 1981-06-28  Procedure Date: 05/07/2013  Nuclear Med Background Indication for Stress Test:  Evaluation for Ischemia History:  NO PRIOR HISTORY REPORTED. Cardiac Risk Factors: Family History - CAD, Lipids, Obesity, Smoker and TACHYCARDIA  Symptoms:  Chest Pain, Dizziness, DOE, Fatigue and Light-Headedness   Nuclear Pre-Procedure Caffeine/Decaff Intake:  7:00pm NPO After: 5:00am   IV Site: R Antecubital  IV 0.9% NS with Angio Cath:  22g  Chest Size (in):  N/A IV Started by: Emmit Pomfret, RN  Height: 5\' 1"  (1.549 m)  Cup Size: DD  BMI:  Body mass index is 37.24 kg/(m^2). Weight:  197 lb (89.359 kg)   Tech Comments:  N/A    Nuclear Med Study 1 or 2 day study: 1 day  Stress Test Type:  Stress  Order Authorizing Provider:  Thurmon Fair, MD   Resting Radionuclide: Technetium 82m Sestamibi  Resting Radionuclide Dose: 10.3 mCi   Stress Radionuclide:  Technetium 77m Sestamibi  Stress Radionuclide Dose: 30.9 mCi           Stress Protocol Rest HR: 84 Stress HR: 176  Rest BP: 109/74 Stress BP: 166/65  Exercise Time (min): 8:16 METS: 10.10          Dose of Adenosine (mg):  n/a Dose of Lexiscan: n/a mg  Dose of Atropine (mg): n/a Dose of Dobutamine: n/a mcg/kg/min (at max HR)  Stress Test Technologist: Ernestene Mention, CCT Nuclear Technologist: Gonzella Lex, CNMT   Rest Procedure:  Myocardial perfusion imaging was performed at rest 45 minutes following the intravenous administration of Technetium 49m Sestamibi. Stress Procedure:  The patient performed treadmill exercise using a Bruce  Protocol for 8 minutes and 16 seconds. The patient stopped due to fatigue and dizziness. Patient denied any chest pain.  There were no significant ST-T wave changes.  Technetium 72m  Sestamibi was injected at peak exercise and myocardial perfusion imaging was performed after a brief delay.  Transient Ischemic Dilatation (Normal <1.22):  0.97 Lung/Heart Ratio (Normal <0.45):  0.26 QGS EDV:  83 ml QGS ESV:  33 ml LV Ejection Fraction: 60%     Rest ECG: NSR - Normal EKG  Stress ECG: No significant change from baseline ECG  QPS Raw Data Images:  Normal; no motion artifact; normal heart/lung ratio. Stress Images:  Normal homogeneous uptake in all areas of the myocardium. Rest Images:  Comparison with the stress images reveals no significant change. Subtraction (SDS):  Normal  Impression Exercise Capacity:  Good exercise capacity. BP Response:  Normal blood pressure response. Clinical Symptoms:  No significant symptoms noted. ECG Impression:  No significant ST segment change suggestive of ischemia. Comparison with Prior Nuclear Study: No previous nuclear study performed  Overall Impression:  Normal stress nuclear study.  LV Wall Motion:  NL LV Function; NL Wall Motion   Arshan Jabs, MD  05/07/2013 2:19 PM

## 2013-05-14 DIAGNOSIS — G4730 Sleep apnea, unspecified: Secondary | ICD-10-CM

## 2013-05-17 ENCOUNTER — Telehealth: Payer: Self-pay | Admitting: Family Medicine

## 2013-05-17 NOTE — Telephone Encounter (Signed)
[facility]

## 2013-05-17 NOTE — Telephone Encounter (Signed)
Refill request for Escitalopram 10 mg and a 90 day supply to [facility].

## 2013-05-19 MED ORDER — ESCITALOPRAM OXALATE 10 MG PO TABS: 10.0000 mg | ORAL_TABLET | Freq: Every day | ORAL | Status: DC

## 2013-05-19 NOTE — Telephone Encounter (Signed)
I sent script [facility]. 

## 2013-05-20 ENCOUNTER — Ambulatory Visit: Payer: BC Managed Care – PPO | Admitting: Cardiovascular Disease

## 2013-05-25 ENCOUNTER — Telehealth: Payer: Self-pay | Admitting: Cardiovascular Disease

## 2013-05-25 NOTE — Telephone Encounter (Signed)
Message forwarded to Dr. Kelly.

## 2013-05-25 NOTE — Telephone Encounter (Signed)
Returned call.  Left message to call back before 4pm.  

## 2013-05-25 NOTE — Telephone Encounter (Signed)
Returning your call. °

## 2013-05-25 NOTE — Telephone Encounter (Signed)
Returned call.  Pt informed message received.  Pt stated she had sleep study on 7.25.14.  Informed study hasn't been ready yet and once it has been she will get a call from [facility] if abnormal or our office if normal.  Pt verbalized understanding and agreed w/ plan.

## 2013-05-25 NOTE — Telephone Encounter (Signed)
Wants to know if her sleep study results are back?

## 2013-06-01 ENCOUNTER — Ambulatory Visit (INDEPENDENT_AMBULATORY_CARE_PROVIDER_SITE_OTHER): Payer: BC Managed Care – PPO | Admitting: Family Medicine

## 2013-06-01 VITALS — BP 98/60 | HR 101 | Temp 100.7°F | Resp 19 | Ht 62.5 in | Wt 197.0 lb

## 2013-06-01 DIAGNOSIS — B373 Candidiasis of vulva and vagina: Secondary | ICD-10-CM

## 2013-06-01 DIAGNOSIS — J020 Streptococcal pharyngitis: Secondary | ICD-10-CM

## 2013-06-01 DIAGNOSIS — J029 Acute pharyngitis, unspecified: Secondary | ICD-10-CM

## 2013-06-01 LAB — POCT RAPID STREP A (OFFICE): Rapid Strep A Screen: POSITIVE — AB

## 2013-06-01 MED ORDER — MAGIC MOUTHWASH W/LIDOCAINE: 5.0000 mL | Freq: Four times a day (QID) | ORAL | Status: DC | PRN

## 2013-06-01 MED ORDER — AMOXICILLIN-POT CLAVULANATE 875-125 MG PO TABS: 1.0000 | ORAL_TABLET | Freq: Two times a day (BID) | ORAL | Status: DC

## 2013-06-01 MED ORDER — FLUCONAZOLE 150 MG PO TABS: 150.0000 mg | ORAL_TABLET | Freq: Once | ORAL | Status: DC

## 2013-06-01 NOTE — Patient Instructions (Signed)
Start antibiotic, mouthwash if needed. Return to the clinic or go to the nearest emergency room if any of your symptoms worsen or new symptoms occur.  Diflucan after antibiotics if sx's of yeast infection. Strep Throat Strep throat is an infection of the throat caused by a bacteria named Streptococcus pyogenes. Your caregiver may call the infection streptococcal "tonsillitis" or "pharyngitis" depending on whether there are signs of inflammation in the tonsils or back of the throat. Strep throat is most common in children aged 5 15 years during the cold months of the year, but it can occur in people of any age during any season. This infection is spread from person to person (contagious) through coughing, sneezing, or other close contact. SYMPTOMS   Fever or chills.  Painful, swollen, red tonsils or throat.  Pain or difficulty when swallowing.  White or yellow spots on the tonsils or throat.  Swollen, tender lymph nodes or "glands" of the neck or under the jaw.  Red rash all over the body (rare). DIAGNOSIS  Many different infections can cause the same symptoms. A test must be done to confirm the diagnosis so the right treatment can be given. A "rapid strep test" can help your caregiver make the diagnosis in a few minutes. If this test is not available, a light swab of the infected area can be used for a throat culture test. If a throat culture test is done, results are usually available in a day or two. TREATMENT  Strep throat is treated with antibiotic medicine. HOME CARE INSTRUCTIONS   Gargle with 1 tsp of salt in 1 cup of warm water, 3 4 times per day or as needed for comfort.  Family members who also have a sore throat or fever should be tested for strep throat and treated with antibiotics if they have the strep infection.  Make sure everyone in your household washes their hands well.  Do not share food, drinking cups, or personal items that could cause the infection to spread to  others.  You may need to eat a soft food diet until your sore throat gets better.  Drink enough water and fluids to keep your urine clear or pale yellow. This will help prevent dehydration.  Get plenty of rest.  Stay home from school, daycare, or work until you have been on antibiotics for 24 hours.  Only take over-the-counter or prescription medicines for pain, discomfort, or fever as directed by your caregiver.  If antibiotics are prescribed, take them as directed. Finish them even if you start to feel better. SEEK MEDICAL CARE IF:   The glands in your neck continue to enlarge.  You develop a rash, cough, or earache.  You cough up green, yellow-brown, or bloody sputum.  You have pain or discomfort not controlled by medicines.  Your problems seem to be getting worse rather than better. SEEK IMMEDIATE MEDICAL CARE IF:   You develop any new symptoms such as vomiting, severe headache, stiff or painful neck, chest pain, shortness of breath, or trouble swallowing.  You develop severe throat pain, drooling, or changes in your voice.  You develop swelling of the neck, or the skin on the neck becomes red and tender.  You have a fever.  You develop signs of dehydration, such as fatigue, dry mouth, and decreased urination.  You become increasingly sleepy, or you cannot wake up completely. Document Released: 09/27/2000 Document Revised: 09/16/2012 Document Reviewed: 11/29/2010 [facility] Patient Information 2014 [place], [place].

## 2013-06-01 NOTE — Progress Notes (Signed)
Subjective:    Patient ID: Andrea [name], female    DOB: September 02, 1981, 31 y.o.   MRN: [id number]  HPI Andrea [name] is a 32 y.o. female  bodyaches, chills, sore throat - feels like strep. Started with cold symptoms last week - only congestion last week, felt ok - getting better. then worse last night.  Last had a year or two ago. Some cough and congestion - but sore throat worst part. Some cough, occasional yellow sputum. Not short of breath.  Subjective fever/chills past 2 days. Some yellow appearance on tonsils yesterday.   Tx: ibuprofen.   SH: no known sick contacts.   Past Medical History  Diagnosis Date  . Anxiety   . Kidney stones   . Hyperlipidemia   . Abdominal pain   . High cholesterol    Past Surgical History  Procedure Laterality Date  . Cholecystectomy  08/05/11  . Kidney stone surgery     No Known Allergies Prior to Admission medications   Medication Sig Start Date End Date Taking? Authorizing Provider  escitalopram (LEXAPRO) 10 MG tablet Take 1 tablet (10 mg total) by mouth daily. 05/19/13  Yes Nelwyn Salisbury, MD  TRINESSA, 28, 0.18/0.215/0.25 MG-35 MCG tablet Take 1 tablet by mouth daily. 04/21/13  Yes Historical Provider, MD   History   Social History  . Marital Status: Married    Spouse Name: N/A    Number of Children: N/A  . Years of Education: N/A   Occupational History  . Not on file.   Social History Main Topics  . Smoking status: Current Every Day Smoker -- 1.00 packs/day for 19 years    Types: Cigarettes  . Smokeless tobacco: Never Used  . Alcohol Use: 0.0 oz/week     Comment: rare  . Drug Use: No  . Sexual Activity: Yes    Birth Control/ Protection: Pill   Other Topics Concern  . Not on file   Social History Narrative  . No narrative on file     Review of Systems  Constitutional: Positive for fever and chills.  Respiratory: Positive for cough.   Musculoskeletal: Positive for myalgias.       Objective:   Physical Exam    Vitals reviewed. Constitutional: She is oriented to person, place, and time. She appears well-developed and well-nourished. No distress.  HENT:  Head: Normocephalic and atraumatic.  Right Ear: Hearing, tympanic membrane, external ear and ear canal normal.  Left Ear: Hearing, tympanic membrane, external ear and ear canal normal.  Nose: Nose normal. Right sinus exhibits no maxillary sinus tenderness and no frontal sinus tenderness. Left sinus exhibits no maxillary sinus tenderness and no frontal sinus tenderness.  Mouth/Throat: Mucous membranes are normal. Posterior oropharyngeal erythema present. No oropharyngeal exudate, posterior oropharyngeal edema or tonsillar abscesses.  Eyes: Conjunctivae and EOM are normal. Pupils are equal, round, and reactive to light.  Cardiovascular: Normal rate, regular rhythm, normal heart sounds and intact distal pulses.   No murmur heard. Pulmonary/Chest: Effort normal and breath sounds normal. No respiratory distress. She has no wheezes. She has no rhonchi.  Abdominal: Soft. There is no hepatosplenomegaly. There is no tenderness.  Lymphadenopathy:    She has cervical adenopathy (ttp diiffusely ant cervical nodes without dominant enlarged node palpated. ).  Neurological: She is alert and oriented to person, place, and time.  Skin: Skin is warm and dry. No rash noted.  Psychiatric: She has a normal mood and affect. Her behavior is normal.    Results  for orders placed in visit on 06/01/13  POCT RAPID STREP A (OFFICE)      Result Value Range   Rapid Strep A Screen Positive (*) Negative      Assessment & Plan:  Andrea [name] is a 32 y.o. female Acute pharyngitis - Plan: POCT rapid strep A  Strep pharyngitis - Plan: amoxicillin-clavulanate (AUGMENTIN) 875-125 MG per tablet, Alum & Mag Hydroxide-Simeth (MAGIC MOUTHWASH W/LIDOCAINE) SOLN.  Sx care, rtc precautions, chose augmentin for additional coverage for duration of illness.   rtc precautions discussed,  sx care below.   Hx of Candidal vaginitis with antibiotics.  - Plan: fluconazole (DIFLUCAN) 150 MG tablet if needed.   Meds ordered this encounter  Medications  . amoxicillin-clavulanate (AUGMENTIN) 875-125 MG per tablet    Sig: Take 1 tablet by mouth 2 (two) times daily.    Dispense:  20 tablet    Refill:  0  . Alum & Mag Hydroxide-Simeth (MAGIC MOUTHWASH W/LIDOCAINE) SOLN    Sig: Take 5 mL by mouth 4 (four) times daily as needed.    Dispense:  120 mL    Refill:  0    Ok to substitute ingredients per pharmacy usual "magic mouthwash" prep.  . fluconazole (DIFLUCAN) 150 MG tablet    Sig: Take 1 tablet (150 mg total) by mouth once.    Dispense:  1 tablet    Refill:  0   Patient Instructions  Start antibiotic, mouthwash if needed. Return to the clinic or go to the nearest emergency room if any of your symptoms worsen or new symptoms occur.  Diflucan after antibiotics if sx's of yeast infection. Strep Throat Strep throat is an infection of the throat caused by a bacteria named Streptococcus pyogenes. Your caregiver may call the infection streptococcal "tonsillitis" or "pharyngitis" depending on whether there are signs of inflammation in the tonsils or back of the throat. Strep throat is most common in children aged 5 15 years during the cold months of the year, but it can occur in people of any age during any season. This infection is spread from person to person (contagious) through coughing, sneezing, or other close contact. SYMPTOMS   Fever or chills.  Painful, swollen, red tonsils or throat.  Pain or difficulty when swallowing.  White or yellow spots on the tonsils or throat.  Swollen, tender lymph nodes or "glands" of the neck or under the jaw.  Red rash all over the body (rare). DIAGNOSIS  Many different infections can cause the same symptoms. A test must be done to confirm the diagnosis so the right treatment can be given. A "rapid strep test" can help your caregiver make the  diagnosis in a few minutes. If this test is not available, a light swab of the infected area can be used for a throat culture test. If a throat culture test is done, results are usually available in a day or two. TREATMENT  Strep throat is treated with antibiotic medicine. HOME CARE INSTRUCTIONS   Gargle with 1 tsp of salt in 1 cup of warm water, 3 4 times per day or as needed for comfort.  Family members who also have a sore throat or fever should be tested for strep throat and treated with antibiotics if they have the strep infection.  Make sure everyone in your household washes their hands well.  Do not share food, drinking cups, or personal items that could cause the infection to spread to others.  You may need to eat a  soft food diet until your sore throat gets better.  Drink enough water and fluids to keep your urine clear or pale yellow. This will help prevent dehydration.  Get plenty of rest.  Stay home from school, daycare, or work until you have been on antibiotics for 24 hours.  Only take over-the-counter or prescription medicines for pain, discomfort, or fever as directed by your caregiver.  If antibiotics are prescribed, take them as directed. Finish them even if you start to feel better. SEEK MEDICAL CARE IF:   The glands in your neck continue to enlarge.  You develop a rash, cough, or earache.  You cough up green, yellow-brown, or bloody sputum.  You have pain or discomfort not controlled by medicines.  Your problems seem to be getting worse rather than better. SEEK IMMEDIATE MEDICAL CARE IF:   You develop any new symptoms such as vomiting, severe headache, stiff or painful neck, chest pain, shortness of breath, or trouble swallowing.  You develop severe throat pain, drooling, or changes in your voice.  You develop swelling of the neck, or the skin on the neck becomes red and tender.  You have a fever.  You develop signs of dehydration, such as fatigue, dry  mouth, and decreased urination.  You become increasingly sleepy, or you cannot wake up completely. Document Released: 09/27/2000 Document Revised: 09/16/2012 Document Reviewed: 11/29/2010 [facility] Patient Information 2014 [place], [place].

## 2013-06-04 ENCOUNTER — Telehealth: Payer: Self-pay

## 2013-06-04 NOTE — Telephone Encounter (Signed)
PT WOULD LIKE AN OOW NOTE NOTE FOR YESTERDAY, SHE WAS EXPERIENCING BODY ACHES AND PAIN AND JUST DIDN'T FEEL WELL BUT IS FEELING MUCH BETTER TODAY. BEST# [phone]

## 2013-06-04 NOTE — Telephone Encounter (Signed)
Let employer know that we were going to give pt a note for yesterday and to let her know that we will leave it up front for her to p/u

## 2013-06-08 ENCOUNTER — Telehealth: Payer: Self-pay | Admitting: Cardiovascular Disease

## 2013-06-08 NOTE — Telephone Encounter (Signed)
Would like sleep study results from 05-07-13 please.

## 2013-06-08 NOTE — Telephone Encounter (Signed)
Returned call.  Pt informed results have not been reviewed by Dr. Tresa Endo yet and he will be notified that she is calling again.  Pt verbalized understanding and agreed w/ plan.

## 2013-06-21 ENCOUNTER — Encounter: Payer: BC Managed Care – PPO | Admitting: Cardiovascular Disease

## 2013-06-30 ENCOUNTER — Telehealth: Payer: Self-pay | Admitting: Cardiovascular Disease

## 2013-06-30 NOTE — Telephone Encounter (Signed)
Pt called for the 3rd time today asking about her results for her sleep study. She had her sleep study done back in July and still has not heard anything regarding this test.

## 2013-06-30 NOTE — Telephone Encounter (Signed)
Message forwarded to W. Waddell, CMA.  

## 2013-07-19 ENCOUNTER — Encounter: Payer: Self-pay | Admitting: Cardiovascular Disease

## 2013-07-26 ENCOUNTER — Encounter: Payer: Self-pay | Admitting: Cardiovascular Disease

## 2013-07-26 ENCOUNTER — Ambulatory Visit (INDEPENDENT_AMBULATORY_CARE_PROVIDER_SITE_OTHER): Payer: BC Managed Care – PPO | Admitting: Cardiovascular Disease

## 2013-07-26 VITALS — BP 142/84 | HR 116 | Ht 61.0 in | Wt 196.3 lb

## 2013-07-26 DIAGNOSIS — Z720 Tobacco use: Secondary | ICD-10-CM

## 2013-07-26 DIAGNOSIS — E669 Obesity, unspecified: Secondary | ICD-10-CM

## 2013-07-26 DIAGNOSIS — R000 Tachycardia, unspecified: Secondary | ICD-10-CM

## 2013-07-26 DIAGNOSIS — Z87891 Personal history of nicotine dependence: Secondary | ICD-10-CM

## 2013-07-26 DIAGNOSIS — R079 Chest pain, unspecified: Secondary | ICD-10-CM

## 2013-07-26 DIAGNOSIS — G4733 Obstructive sleep apnea (adult) (pediatric): Secondary | ICD-10-CM

## 2013-07-26 DIAGNOSIS — F17200 Nicotine dependence, unspecified, uncomplicated: Secondary | ICD-10-CM

## 2013-07-26 MED ORDER — VARENICLINE TARTRATE 0.5 MG X 11 & 1 MG X 42 PO MISC: ORAL | Status: DC

## 2013-07-26 MED ORDER — VARENICLINE TARTRATE 1 MG PO TABS: 1.0000 mg | ORAL_TABLET | Freq: Two times a day (BID) | ORAL | Status: DC

## 2013-07-26 NOTE — Patient Instructions (Signed)
Your physician recommends that you schedule a follow-up appointment in: 3 months.  START Chantix as directed.

## 2013-07-27 NOTE — Assessment & Plan Note (Signed)
Weight loss is strongly recommended. However I would focus on smoking cessation first as the more important immediate goal.

## 2013-07-27 NOTE — Assessment & Plan Note (Signed)
Scheduled for CPAP titration study

## 2013-07-27 NOTE — Assessment & Plan Note (Signed)
Waist to increase her positive successfully quitting tobacco were discussed in detail for over 10 minutes. Provided prescription for Chantix and advised the use of both behavior desiccation techniques and nicotine alternatives.

## 2013-07-27 NOTE — Progress Notes (Signed)
Patient ID: Andrea [name], female   DOB: 09/05/1981, 32 y.o.   MRN: [id number]      Reason for office visit Sleep study and nuclear stress test  Andrea [name] continues to feel fatigued but denies dyspnea chest pain dizziness or syncope. Her nuclear stress test was completely normal. Her sleep study report was delayed but does show evidence of significant albeit mild obstructive sleep apnea. She had significant desaturation and a much higher incidence of apneic events during REM sleep. Oxygen saturation 90 or was 81%. She has not yet been scheduled for the CPAP titration part of the study and we will expedite this today. We had a long discussion regarding the importance of smoking cessation today. She has a very strong family history of coronary disease and is very interested in smoking cessation but has found previous attempts to quit very difficult. She seems to have both nicotine addiction (she has diaphoresis, headaches and anxiety when she doesn't smoke) but also a lot of behavioral dependency. We discussed options for adjuvant therapy such as Chantix, nicotine supplements or Wellbutrin in detail. Puffing on a water vapor e-cigarette and other behavioral therapies were also discussed.   No Known Allergies  Current Outpatient Prescriptions  Medication Sig Dispense Refill  . escitalopram (LEXAPRO) 10 MG tablet Take 1 tablet (10 mg total) by mouth daily.  90 tablet  3  . TRINESSA, 28, 0.18/0.215/0.25 MG-35 MCG tablet Take 1 tablet by mouth daily.      . varenicline (CHANTIX CONTINUING MONTH PAK) 1 MG tablet Take 1 tablet (1 mg total) by mouth 2 (two) times daily.  60 tablet  3  . varenicline (CHANTIX STARTING MONTH PAK) 0.5 MG X 11 & 1 MG X 42 tablet Take one 0.5 mg tablet by mouth once daily for 3 days, then increase to one 0.5 mg tablet twice daily for 4 days, then increase to one 1 mg tablet twice daily.  53 tablet  0   No current facility-administered medications for this visit.    Past  Medical History  Diagnosis Date  . Anxiety   . Kidney stones   . Hyperlipidemia   . Abdominal pain   . High cholesterol     Past Surgical History  Procedure Laterality Date  . Cholecystectomy  08/05/11  . Kidney stone surgery      Family History  Problem Relation Age of Onset  . Heart attack Mother   . Hypertension Father   . Heart disease Maternal Uncle   . Heart attack Maternal Uncle   . Heart attack Paternal Aunt     During a stress test  . Heart attack Paternal Uncle   . Heart attack Paternal Grandfather     History   Social History  . Marital Status: Married    Spouse Name: N/A    Number of Children: N/A  . Years of Education: N/A   Occupational History  . Not on file.   Social History Main Topics  . Smoking status: Current Every Day Smoker -- 1.00 packs/day for 19 years    Types: Cigarettes  . Smokeless tobacco: Never Used  . Alcohol Use: 0.0 oz/week     Comment: rare  . Drug Use: No  . Sexual Activity: Yes    Birth Control/ Protection: Pill   Other Topics Concern  . Not on file   Social History Narrative  . No narrative on file    Review of systems: The patient specifically denies any chest pain at rest  or with exertion, dyspnea at rest or with exertion, orthopnea, paroxysmal nocturnal dyspnea, syncope, palpitations, focal neurological deficits, intermittent claudication, lower extremity edema, unexplained weight gain, cough, hemoptysis or wheezing.  The patient also denies abdominal pain, nausea, vomiting, dysphagia, diarrhea, constipation, polyuria, polydipsia, dysuria, hematuria, frequency, urgency, abnormal bleeding or bruising, fever, chills, unexpected weight changes, mood swings, change in skin or hair texture, change in voice quality, auditory or visual problems, allergic reactions or rashes, new musculoskeletal complaints other than usual "aches and pains".   PHYSICAL EXAM BP 142/84  Pulse 116  Ht 5\' 1"  (1.549 m)  Wt 196 lb 4.8 oz  (89.041 kg)  BMI 37.11 kg/m2 General: Alert, oriented x3, no distress, moderately obese  Head: no evidence of trauma, PERRL, EOMI, no exophtalmos or lid lag, no myxedema, no xanthelasma; normal ears, nose and oropharynx  Neck: normal jugular venous pulsations and no hepatojugular reflux; brisk carotid pulses without delay and no carotid bruits  Chest: clear to auscultation, no signs of consolidation by percussion or palpation, normal fremitus, symmetrical and full respiratory excursions  Cardiovascular: normal position and quality of the apical impulse, regular rhythm, normal first and second heart sounds, no murmurs, rubs or gallops  Abdomen: no tenderness or distention, no masses by palpation, no abnormal pulsatility or arterial bruits, normal bowel sounds, no hepatosplenomegaly  Extremities: no clubbing, cyanosis or edema; 2+ radial, ulnar and brachial pulses bilaterally; 2+ right femoral, posterior tibial and dorsalis pedis pulses; 2+ left femoral, posterior tibial and dorsalis pedis pulses; no subclavian or femoral bruits  Neurological: grossly nonfocal   EKG: Normal, normal sinus rhythm  Lipid Panel     Component Value Date/Time   CHOL 219* 12/29/2009 0851   TRIG 116.0 12/29/2009 0851   HDL 41.40 12/29/2009 0851   CHOLHDL 5 12/29/2009 0851   VLDL 23.2 12/29/2009 0851    BMET    Component Value Date/Time   NA 138 04/22/2013 2205   K 3.8 04/22/2013 2205   CL 102 04/22/2013 2205   CO2 26 04/22/2013 2205   GLUCOSE 143* 04/22/2013 2205   BUN 15 04/22/2013 2205   CREATININE 0.80 04/22/2013 2205   CALCIUM 9.0 04/22/2013 2205   GFRNONAA >90 04/22/2013 2205   GFRAA >90 04/22/2013 2205     ASSESSMENT AND PLAN Obstructive sleep apnea Scheduled for CPAP titration study  Chest pain on exertion No evidence of perfusion abnormalities on nuclear stress test. Strong family history of premature CAD in multiple family members. Strongly recommend smoking cessation the  Obesity (BMI 35.0-39.9  without comorbidity) Weight loss is strongly recommended. However I would focus on smoking cessation first as the more important immediate goal.  Tobacco abuse Waist to increase her positive successfully quitting tobacco were discussed in detail for over 10 minutes. Provided prescription for Chantix and advised the use of both behavior desiccation techniques and nicotine alternatives.   Orders Placed This Encounter  Procedures  . EKG 12-Lead   Meds ordered this encounter  Medications  . varenicline (CHANTIX STARTING MONTH PAK) 0.5 MG X 11 & 1 MG X 42 tablet    Sig: Take one 0.5 mg tablet by mouth once daily for 3 days, then increase to one 0.5 mg tablet twice daily for 4 days, then increase to one 1 mg tablet twice daily.    Dispense:  53 tablet    Refill:  0  . varenicline (CHANTIX CONTINUING MONTH PAK) 1 MG tablet    Sig: Take 1 tablet (1 mg total) by mouth 2 (  two) times daily.    Dispense:  60 tablet    Refill:  3    Samaia Iwata  Thurmon Fair, MD, [facility] [phone] office [phone] pager

## 2013-07-27 NOTE — Assessment & Plan Note (Signed)
No evidence of perfusion abnormalities on nuclear stress test. Strong family history of premature CAD in multiple family members. Strongly recommend smoking cessation the

## 2013-07-30 ENCOUNTER — Ambulatory Visit (INDEPENDENT_AMBULATORY_CARE_PROVIDER_SITE_OTHER): Payer: BC Managed Care – PPO

## 2013-07-30 DIAGNOSIS — Z23 Encounter for immunization: Secondary | ICD-10-CM

## 2013-08-10 ENCOUNTER — Telehealth: Payer: Self-pay | Admitting: Cardiovascular Disease

## 2013-08-10 NOTE — Telephone Encounter (Signed)
Message forwarded to Dr. Croitoru/Barbara, CMA 

## 2013-08-10 NOTE — Telephone Encounter (Signed)
Have we received her sleep study results yet?

## 2013-08-11 NOTE — Telephone Encounter (Signed)
The 10/21 study is not yet officially reported. Usually, at this point, the CPAP provider company will contact her directly. Please call us back if they have not done so by this time next week.

## 2013-08-11 NOTE — Telephone Encounter (Signed)
Andrea [name], we have her sleep study results, but I believe she is referring to the titration study and I do not see that result. Could you check please?

## 2013-08-11 NOTE — Telephone Encounter (Signed)
Forwarded to B. Lassiter, CMA.  

## 2013-08-11 NOTE — Telephone Encounter (Signed)
Contacted [facility] CPAP titration was done 08/03/13 and has not been read yet.

## 2013-08-11 NOTE — Telephone Encounter (Signed)
[facility] contacted CPAP study done 08/03/13 and has not been read yet. Message sent to Dr. Salena Saner.

## 2013-08-25 ENCOUNTER — Telehealth: Payer: Self-pay | Admitting: *Deleted

## 2013-08-25 NOTE — Telephone Encounter (Signed)
LM on cell # asking if she received everything she needs for her CPAP/BIPAP machine.  Asked that she call and let me know to make sure all is copasetic.

## 2013-11-22 ENCOUNTER — Encounter: Payer: Self-pay | Admitting: Internal Medicine

## 2013-11-22 ENCOUNTER — Ambulatory Visit (INDEPENDENT_AMBULATORY_CARE_PROVIDER_SITE_OTHER): Payer: BC Managed Care – PPO | Admitting: Internal Medicine

## 2013-11-22 VITALS — BP 124/80 | HR 103 | Temp 99.0°F | Resp 20 | Ht 61.0 in | Wt 204.0 lb

## 2013-11-22 DIAGNOSIS — J069 Acute upper respiratory infection, unspecified: Secondary | ICD-10-CM

## 2013-11-22 MED ORDER — OSELTAMIVIR PHOSPHATE 75 MG PO CAPS: 75.0000 mg | ORAL_CAPSULE | Freq: Two times a day (BID) | ORAL | Status: DC

## 2013-11-22 NOTE — Patient Instructions (Signed)
Acute bronchitis symptoms for less than 10 days are generally not helped by antibiotics.  Take over-the-counter expectorants and cough medications such as  Mucinex DM.  Call if there is no improvement in 5 to 7 days or if he developed worsening cough, fever, or new symptoms, such as shortness of breath or chest pain.  Influenza, Adult Influenza ("the flu") is a viral infection of the respiratory tract. It occurs more often in winter months because people spend more time in close contact with one another. Influenza can make you feel very sick. Influenza easily spreads from person to person (contagious). CAUSES  Influenza is caused by a virus that infects the respiratory tract. You can catch the virus by breathing in droplets from an infected person's cough or sneeze. You can also catch the virus by touching something that was recently contaminated with the virus and then touching your mouth, nose, or eyes. SYMPTOMS  Symptoms typically last 4 to 10 days and may include:  Fever.  Chills.  Headache, body aches, and muscle aches.  Sore throat.  Chest discomfort and cough.  Poor appetite.  Weakness or feeling tired.  Dizziness.  Nausea or vomiting. DIAGNOSIS  Diagnosis of influenza is often made based on your history and a physical exam. A nose or throat swab test can be done to confirm the diagnosis. RISKS AND COMPLICATIONS You may be at risk for a more severe case of influenza if you smoke cigarettes, have diabetes, have chronic heart disease (such as heart failure) or lung disease (such as asthma), or if you have a weakened immune system. Elderly people and pregnant women are also at risk for more serious infections. The most common complication of influenza is a lung infection (pneumonia). Sometimes, this complication can require emergency medical care and may be life-threatening. PREVENTION  An annual influenza vaccination (flu shot) is the best way to avoid getting influenza. An annual  flu shot is now routinely recommended for all adults in the [place] TREATMENT  In mild cases, influenza goes away on its own. Treatment is directed at relieving symptoms. For more severe cases, your caregiver may prescribe antiviral medicines to shorten the sickness. Antibiotic medicines are not effective, because the infection is caused by a virus, not by bacteria. HOME CARE INSTRUCTIONS  Only take over-the-counter or prescription medicines for pain, discomfort, or fever as directed by your caregiver.  Use a cool mist humidifier to make breathing easier.  Get plenty of rest until your temperature returns to normal. This usually takes 3 to 4 days.  Drink enough fluids to keep your urine clear or pale yellow.  Cover your mouth and nose when coughing or sneezing, and wash your hands well to avoid spreading the virus.  Stay home from work or school until your fever has been gone for at least 1 full day. SEEK MEDICAL CARE IF:   You have chest pain or a deep cough that worsens or produces more mucus.  You have nausea, vomiting, or diarrhea. SEEK IMMEDIATE MEDICAL CARE IF:   You have difficulty breathing, shortness of breath, or your skin or nails turn bluish.  You have severe neck pain or stiffness.  You have a severe headache, facial pain, or earache.  You have a worsening or recurring fever.  You have nausea or vomiting that cannot be controlled. MAKE SURE YOU:  Understand these instructions.  Will watch your condition.  Will get help right away if you are not doing well or get worse. Document Released: 09/27/2000   Document Revised: 03/31/2012 Document Reviewed: 12/30/2011 [employer] Patient Information 2014 [employer].  

## 2013-11-22 NOTE — Progress Notes (Signed)
Subjective:    Patient ID: Andrea [name], female    DOB: Nov 28, 1980, 33 y.o.   MRN: [id number]  HPI  33 year old patient who presents with a 24-hour history of fever severe myalgias and fatigue. She has had low-grade fever and mild cough. She states that she has been unable to get out of bed since she awoke yesterday morning;  she missed work today.  Past Medical History  Diagnosis Date  . Anxiety   . Kidney stones   . Hyperlipidemia   . Abdominal pain   . High cholesterol     History   Social History  . Marital Status: Married    Spouse Name: N/A    Number of Children: N/A  . Years of Education: N/A   Occupational History  . Not on file.   Social History Main Topics  . Smoking status: Current Every Day Smoker -- 1.00 packs/day for 19 years    Types: Cigarettes  . Smokeless tobacco: Never Used  . Alcohol Use: 0.0 oz/week     Comment: rare  . Drug Use: No  . Sexual Activity: Yes    Birth Control/ Protection: Pill   Other Topics Concern  . Not on file   Social History Narrative  . No narrative on file    Past Surgical History  Procedure Laterality Date  . Cholecystectomy  08/05/11  . Kidney stone surgery      Family History  Problem Relation Age of Onset  . Heart attack Mother   . Hypertension Father   . Heart disease Maternal Uncle   . Heart attack Maternal Uncle   . Heart attack Paternal Aunt     During a stress test  . Heart attack Paternal Uncle   . Heart attack Paternal Grandfather     No Known Allergies  Current Outpatient Prescriptions on File Prior to Visit  Medication Sig Dispense Refill  . escitalopram (LEXAPRO) 10 MG tablet Take 1 tablet (10 mg total) by mouth daily.  90 tablet  3  . TRINESSA, 28, 0.18/0.215/0.25 MG-35 MCG tablet Take 1 tablet by mouth daily.      . varenicline (CHANTIX CONTINUING MONTH PAK) 1 MG tablet Take 1 tablet (1 mg total) by mouth 2 (two) times daily.  60 tablet  3  . varenicline (CHANTIX STARTING MONTH PAK)  0.5 MG X 11 & 1 MG X 42 tablet Take one 0.5 mg tablet by mouth once daily for 3 days, then increase to one 0.5 mg tablet twice daily for 4 days, then increase to one 1 mg tablet twice daily.  53 tablet  0   No current facility-administered medications on file prior to visit.    BP 124/80  Pulse 103  Temp(Src) 99 F (37.2 C) (Oral)  Resp 20  Ht 5\' 1"  (1.549 m)  Wt 204 lb (92.534 kg)  BMI 38.57 kg/m2  SpO2 97%     Review of Systems  Constitutional: Positive for fever and fatigue.  HENT: Negative for congestion, dental problem, hearing loss, rhinorrhea, sinus pressure, sore throat and tinnitus.   Eyes: Negative for pain, discharge and visual disturbance.  Respiratory: Positive for cough. Negative for shortness of breath.   Cardiovascular: Negative for chest pain, palpitations and leg swelling.  Gastrointestinal: Negative for nausea, vomiting, abdominal pain, diarrhea, constipation, blood in stool and abdominal distention.  Genitourinary: Negative for dysuria, urgency, frequency, hematuria, flank pain, vaginal bleeding, vaginal discharge, difficulty urinating, vaginal pain and pelvic pain.  Musculoskeletal: Positive for myalgias.  Negative for arthralgias, gait problem and joint swelling.  Skin: Negative for rash.  Neurological: Negative for dizziness, syncope, speech difficulty, weakness, numbness and headaches.  Hematological: Negative for adenopathy.  Psychiatric/Behavioral: Negative for behavioral problems, dysphoric mood and agitation. The patient is not nervous/anxious.        Objective:   Physical Exam  Constitutional: She is oriented to person, place, and time. She appears well-developed and well-nourished.  Appears weak and unwell but in no acute distress  HENT:  Head: Normocephalic.  Right Ear: External ear normal.  Left Ear: External ear normal.  Mild erythema of the oropharynx  Eyes: Conjunctivae and EOM are normal. Pupils are equal, round, and reactive to light.    Neck: Normal range of motion. Neck supple. No thyromegaly present.  Cardiovascular: Normal rate, regular rhythm, normal heart sounds and intact distal pulses.   Pulmonary/Chest: Effort normal and breath sounds normal.  Abdominal: Soft. Bowel sounds are normal. She exhibits no mass. There is no tenderness.  Musculoskeletal: Normal range of motion.  Lymphadenopathy:    She has no cervical adenopathy.  Neurological: She is alert and oriented to person, place, and time.  Skin: Skin is warm and dry. No rash noted.  Psychiatric: She has a normal mood and affect. Her behavior is normal.          Assessment & Plan:  Flu syndrome  We'll treat with bed rest fluids and diclofenac

## 2013-11-22 NOTE — Progress Notes (Signed)
Pre-visit discussion using our clinic review tool. No additional management support is needed unless otherwise documented below in the visit note.  

## 2013-11-23 ENCOUNTER — Telehealth: Payer: Self-pay | Admitting: Family Medicine

## 2013-11-23 NOTE — Telephone Encounter (Signed)
Relevant patient education mailed to patient.  

## 2013-11-25 ENCOUNTER — Telehealth: Payer: Self-pay | Admitting: Family Medicine

## 2013-11-25 NOTE — Telephone Encounter (Signed)
Pt was seen by dr. Kirtland Bouchardk on 11/22/13, pt is needing a note for her job, pt was out from 11/22/13-11/24/13, pt return to work 11/25/13, if possible pt would like it faxed to [phone] .

## 2013-11-25 NOTE — Telephone Encounter (Signed)
Spoke to pt told her return to work note done and will fax now to number. Pt verbalized understanding. Note faxed.

## 2013-12-09 ENCOUNTER — Ambulatory Visit: Payer: BC Managed Care – PPO | Admitting: Cardiovascular Disease

## 2014-01-18 ENCOUNTER — Ambulatory Visit (INDEPENDENT_AMBULATORY_CARE_PROVIDER_SITE_OTHER): Payer: BC Managed Care – PPO | Admitting: Cardiovascular Disease

## 2014-01-18 VITALS — BP 132/95 | HR 117 | Ht 61.0 in | Wt 201.9 lb

## 2014-01-18 DIAGNOSIS — Z720 Tobacco use: Secondary | ICD-10-CM

## 2014-01-18 DIAGNOSIS — R000 Tachycardia, unspecified: Secondary | ICD-10-CM

## 2014-01-18 DIAGNOSIS — F17200 Nicotine dependence, unspecified, uncomplicated: Secondary | ICD-10-CM

## 2014-01-18 DIAGNOSIS — E669 Obesity, unspecified: Secondary | ICD-10-CM

## 2014-01-18 DIAGNOSIS — G4733 Obstructive sleep apnea (adult) (pediatric): Secondary | ICD-10-CM

## 2014-01-18 NOTE — Patient Instructions (Signed)
Your physician recommends that you schedule a follow-up appointment as  needed with Dr. Kelly for sleep. 

## 2014-02-02 ENCOUNTER — Encounter: Payer: Self-pay | Admitting: Cardiovascular Disease

## 2014-02-02 NOTE — Progress Notes (Signed)
Patient ID: Andrea MenghiniLauren A Breese, female   DOB: [birth year], 33 y.o.   MRN: [id number]     HPI: Andrea MenghiniLauren A [name] is a 33 y.o. female who is followed by Dr. Royann Shiversroitoru for cardiology care.  She presents to the sleep clinic today following initiation of CPAP therapy for obstructive sleep apnea.  This punches is a 33 year old female, who has a history of daytime somnolence, loud snoring, witnessed apnea.  He had and nonrestorative sleep.  There is a history of obesity, with a body mass index of 37, as well as a history of tobacco use, and hyperlipidemia.  There is family history for heart disease.  She was referred for a diagnostic polysomnogram, which was done in August 2014.  This confirmed mild sleep apnea overall with HIV 5.2 per hour; however, during REM sleep.  This was moderately severe at 28.2 per hour.  She had significant oxygen desaturation to a nadir of 80% with non-REM sleep and 81% with REM sleep.  There was evidence for loud snoring.  She also had occasional periodic leg movements with an index of 5.2 per hour with 0.8 per hour to arousal.  She underwent a CPAP titration trial and 12 cm water pressure was recommended as her initial CPAP pressure.  She is now followed [facility] her equipment.  Since initiating CPAP therapy, her sleep is improved.  Daytime sleepiness has markedly improved from initial endorsement at 18 following CPAP therapy as noted below, to a score of 8.  I did review a download which was obtained from 08/27/2013 to 09/25/2013.  She is 93% of the days with device usage 77% of the days were greater than 4 hours per average use was 6 hours and 9 minutes.  After a set pressure of 12 cm water AHI was excellent at 0.6.  Typically, she goes to bed at midnight and wakes up at 6 AM.  She is unaware of breakthrough snoring.  She denies painful restless legs.   Epworth Sleepiness Scale: Situation   Chance of Dozing/Sleeping (0 = never , 1 = slight chance , 2 = moderate chance  , 3 = high chance )   sitting and reading 1   watching TV 0   sitting inactive in a public place 0   being a passenger in a motor vehicle for an hour or more 1   lying down in the afternoon 3   sitting and talking to someone 0   sitting quietly after lunch (no alcohol) 3   while stopped for a few minutes in traffic as the driver 0   Total Score  8    Past Medical History  Diagnosis Date  . Anxiety   . Kidney stones   . Hyperlipidemia   . Abdominal pain   . High cholesterol     Past Surgical History  Procedure Laterality Date  . Cholecystectomy  08/05/11  . Kidney stone surgery      No Known Allergies  Current Outpatient Prescriptions  Medication Sig Dispense Refill  . escitalopram (LEXAPRO) 10 MG tablet Take 1 tablet (10 mg total) by mouth daily.  90 tablet  3  . TRINESSA, 28, 0.18/0.215/0.25 MG-35 MCG tablet Take 1 tablet by mouth daily.       No current facility-administered medications for this visit.    Socially, she is married.  She works at [employer] in the [employer].  There is a history of tobacco use, and recently she was given a prescription  for Chantix  ROS negative for fever, chills or night sweats.  She denies rash.  She denies change in vision or hearing.  There is moderate obesity.  She denies wheezing.  She denies chest pressure.  He denies nausea, vomiting, or diarrhea.  There is no claudication.  She denies edema.  She denies painful restless legs.  There is no bruxism.  She denies hypnagogic loosening since.  She is unaware of any cataplectic spells. Other comprehensive 14 system review is negative.  PE BP 132/95  Pulse 117  Ht 5\' 1"  (1.549 m)  Wt 201 lb 14.4 oz (91.581 kg)  BMI 38.17 kg/m2  General: Alert, oriented, no distress.  Skin: normal turgor, no rashes HEENT: Normocephalic, atraumatic. Pupils round and reactive; sclera anicteric; extraocular muscles intact; Fundi without hemorrhages or exudates.  Is flat. Nose without nasal  septal hypertrophy Mouth/Parynx benign; Mallinpatti scale 3 Neck: No JVD, no carotid bruits .  Normal carotid upstroke Lungs: clear to ausculatation and percussion; no wheezing or rales  Chest wall: No tenderness to palpation Heart: PMI not displaced; RRR, s1 s2 normal; no murmurs, rubs, thrills or heaves. Abdomen: soft, nontender; no hepatosplenomehaly, BS+; abdominal aorta nontender and not dilated by palpation. Back: No CVA tenderness Pulses 2+ Extremities: no clubbinbg cyanosis or edema, Homan's sign negative  Neurologic: grossly nonfocal; cranial nerves intact. Psychological: Normal affect and mood.  ECG (independently read by me): NSR 91 beats per minute.  Nonspecific T changes.  QTc interval 450 ms.  LABS:  BMET    Component Value Date/Time   NA 138 04/22/2013 2205   K 3.8 04/22/2013 2205   CL 102 04/22/2013 2205   CO2 26 04/22/2013 2205   GLUCOSE 143* 04/22/2013 2205   BUN 15 04/22/2013 2205   CREATININE 0.80 04/22/2013 2205   CALCIUM 9.0 04/22/2013 2205   GFRNONAA >90 04/22/2013 2205   GFRAA >90 04/22/2013 2205     Hepatic Function Panel     Component Value Date/Time   PROT 7.2 08/03/2011 0608   ALBUMIN 3.7 08/03/2011 0608   AST 13 08/03/2011 0608   ALT 13 08/03/2011 0608   ALKPHOS 109 08/03/2011 0608   BILITOT 0.2* 08/03/2011 0608     CBC    Component Value Date/Time   WBC 12.3* 04/22/2013 2205   RBC 4.48 04/22/2013 2205   HGB 13.6 04/22/2013 2205   HCT 39.3 04/22/2013 2205   PLT 246 04/22/2013 2205   MCV 87.7 04/22/2013 2205   MCH 30.4 04/22/2013 2205   MCHC 34.6 04/22/2013 2205   RDW 12.6 04/22/2013 2205   LYMPHSABS 2.5 08/03/2011 1209   MONOABS 0.7 08/03/2011 1209   EOSABS 0.1 08/03/2011 1209   BASOSABS 0.0 08/03/2011 1209     BNP    Component Value Date/Time   PROBNP 24.2 04/22/2013 2205    Lipid Panel     Component Value Date/Time   CHOL 219* 12/29/2009 0851   TRIG 116.0 12/29/2009 0851   HDL 41.40 12/29/2009 0851   CHOLHDL 5 12/29/2009 0851   VLDL  23.2 12/29/2009 0851     RADIOLOGY: No results found.    ASSESSMENT AND PLAN: As Darcus AustinLauren Sanchez is a [age] year old female with moderate obesity, who has confirmed obstructive sleep apnea I. diagnostic polysomnogram there was mild.  Overall, but moderately severe during REM sleep with significant oxygen desaturation.  She had significant hypersomnolence.  Prior to initiating CPAP therapy, and her initial Epworth sleepiness scale score intercourse 18.  Since initiating CPAP treatment, her sleep  pattern is markedly improved.  She has more energy.  She's not waking up as frequently.  And her sleepiness scale now is significantly improved at 8 she did have some difficulty with nasal pillows due to her breathing and apparently did not tolerate a chin strap.  I have written a prescription for her to have a mask of choice.  She is using heated humidification.  She is being compliant standards.  I answered all her questions regarding sleep apnea in particular with reference to is negative cardiovascular effects if left untreated.  We discussed the importance of smoking cessation.  I will go on an as-needed basis from sleep perspective.  She will follow up with Dr.Croitoru for cardiology care.     Lennette Bihari, MD, [facility]  02/02/2014 6:59 PM

## 2014-03-22 ENCOUNTER — Other Ambulatory Visit: Payer: Self-pay | Admitting: Family Medicine

## 2014-07-04 ENCOUNTER — Ambulatory Visit (INDEPENDENT_AMBULATORY_CARE_PROVIDER_SITE_OTHER): Payer: BC Managed Care – PPO | Admitting: Physician Assistant

## 2014-07-04 ENCOUNTER — Encounter: Payer: Self-pay | Admitting: Physician Assistant

## 2014-07-04 VITALS — BP 122/80 | HR 84 | Temp 98.9°F | Resp 18 | Wt 207.0 lb

## 2014-07-04 DIAGNOSIS — K5289 Other specified noninfective gastroenteritis and colitis: Secondary | ICD-10-CM

## 2014-07-04 DIAGNOSIS — K529 Noninfective gastroenteritis and colitis, unspecified: Secondary | ICD-10-CM

## 2014-07-04 MED ORDER — ONDANSETRON HCL 4 MG PO TABS: 4.0000 mg | ORAL_TABLET | Freq: Three times a day (TID) | ORAL | Status: DC | PRN

## 2014-07-04 NOTE — Patient Instructions (Addendum)
You can take over-the-counter Imodium as directed in order to stop your diarrheal symptoms.  Zofran as directed to help nausea.  Brat diet as discussed. And increase fluids with an occasional electrolyte solution to help prevent dehydration.  If emergency symptoms discussed during visit developed, seek medical attention immediately.  Followup as needed, or for worsening or persistent symptoms despite treatment.    Diarrhea Diarrhea is watery poop (stool). It can make you feel weak, tired, thirsty, or give you a dry mouth (signs of dehydration). Watery poop is a sign of another problem, most often an infection. It often lasts 2-3 days. It can last longer if it is a sign of something serious. Take care of yourself as told by your doctor. HOME CARE   Drink 1 cup (8 ounces) of fluid each time you have watery poop.  Do not drink the following fluids:  Those that contain simple sugars (fructose, glucose, galactose, lactose, sucrose, maltose).  Sports drinks.  Fruit juices.  Whole milk products.  Sodas.  Drinks with caffeine (coffee, tea, soda) or alcohol.  Oral rehydration solution may be used if the doctor says it is okay. You may make your own solution. Follow this recipe:   - teaspoon table salt.   teaspoon baking soda.   teaspoon salt substitute containing potassium chloride.  1 tablespoons sugar.  1 liter (34 ounces) of water.  Avoid the following foods:  High fiber foods, such as raw fruits and vegetables.  Nuts, seeds, and whole grain breads and cereals.   Those that are sweetened with sugar alcohols (xylitol, sorbitol, mannitol).  Try eating the following foods:  Starchy foods, such as rice, toast, pasta, low-sugar cereal, oatmeal, baked potatoes, crackers, and bagels.  Bananas.  Applesauce.  Eat probiotic-rich foods, such as yogurt and milk products that are fermented.  Wash your hands well after each time you have watery poop.  Only take medicine  as told by your doctor.  Take a warm bath to help lessen burning or pain from having watery poop. GET HELP RIGHT AWAY IF:   You cannot drink fluids without throwing up (vomiting).  You keep throwing up.  You have blood in your poop, or your poop looks black and tarry.  You do not pee (urinate) in 6-8 hours, or there is only a small amount of very dark pee.  You have belly (abdominal) pain that gets worse or stays in the same spot (localizes).  You are weak, dizzy, confused, or light-headed.  You have a very bad headache.  Your watery poop gets worse or does not get better.  You have a fever or lasting symptoms for more than 2-3 days.  You have a fever and your symptoms suddenly get worse. MAKE SURE YOU:   Understand these instructions.  Will watch your condition.  Will get help right away if you are not doing well or get worse. Document Released: 03/18/2008 Document Revised: 02/14/2014 Document Reviewed: 06/07/2012 [facility] Patient Information 2015 [place], [place]. This information is not intended to replace advice given to you by your health care provider. Make sure you discuss any questions you have with your health care provider.

## 2014-07-04 NOTE — Progress Notes (Signed)
Subjective:    Patient ID: Andrea [name], female    DOB: 10-30-1980, 33 y.o.   MRN: [id number]  Diarrhea  This is a new problem. The current episode started in the past 7 days (4 days). The problem occurs 2 to 4 times per day. The problem has been waxing and waning. The stool consistency is described as watery. The patient states that diarrhea awakens (Had a BM in the Bed. ) her from sleep. Associated symptoms include headaches, increased flatus and myalgias. Pertinent negatives include no abdominal pain, arthralgias, bloating, chills, coughing, fever, sweats, URI, vomiting or weight loss. Nothing aggravates the symptoms. Risk factors include ill contacts. She has tried electrolyte solution and increased fluids for the symptoms. There is no history of bowel resection, inflammatory bowel disease, irritable bowel syndrome, malabsorption, a recent abdominal surgery or short gut syndrome.      Review of Systems  Constitutional: Negative for fever, chills and weight loss.  Respiratory: Negative for cough and shortness of breath.   Cardiovascular: Negative for chest pain.  Gastrointestinal: Positive for nausea, diarrhea and flatus. Negative for vomiting, abdominal pain and bloating.  Musculoskeletal: Positive for myalgias. Negative for arthralgias.  Neurological: Positive for light-headedness and headaches. Negative for dizziness and syncope.  All other systems reviewed and are negative.    Past Medical History  Diagnosis Date  . Anxiety   . Kidney stones   . Hyperlipidemia   . Abdominal pain   . High cholesterol     History   Social History  . Marital Status: Married    Spouse Name: N/A    Number of Children: N/A  . Years of Education: N/A   Occupational History  . Not on file.   Social History Main Topics  . Smoking status: Current Every Day Smoker -- 1.00 packs/day for 19 years    Types: Cigarettes  . Smokeless tobacco: Never Used  . Alcohol Use: 0.0 oz/week   Comment: rare  . Drug Use: No  . Sexual Activity: Yes    Birth Control/ Protection: Pill   Other Topics Concern  . Not on file   Social History Narrative  . No narrative on file    Past Surgical History  Procedure Laterality Date  . Cholecystectomy  08/05/11  . Kidney stone surgery      Family History  Problem Relation Age of Onset  . Heart attack Mother   . Hypertension Father   . Heart disease Maternal Uncle   . Heart attack Maternal Uncle   . Heart attack Paternal Aunt     During a stress test  . Heart attack Paternal Uncle   . Heart attack Paternal Grandfather     No Known Allergies  Current Outpatient Prescriptions on File Prior to Visit  Medication Sig Dispense Refill  . escitalopram (LEXAPRO) 10 MG tablet TAKE 1 TABLET DAILY  90 tablet  3  . TRINESSA, 28, 0.18/0.215/0.25 MG-35 MCG tablet Take 1 tablet by mouth daily.       No current facility-administered medications on file prior to visit.    EXAM: BP 122/80  Pulse 84  Temp(Src) 98.9 F (37.2 C) (Oral)  Resp 18  Wt 207 lb (93.895 kg)     Objective:   Physical Exam  Nursing note and vitals reviewed. Constitutional: She is oriented to person, place, and time. She appears well-developed and well-nourished. No distress.  HENT:  Head: Normocephalic and atraumatic.  Eyes: Conjunctivae and EOM are normal. Pupils are equal, round,  and reactive to light.  Neck: Normal range of motion.  Cardiovascular: Normal rate, regular rhythm and intact distal pulses.   Pulmonary/Chest: Effort normal and breath sounds normal. No respiratory distress. She has no wheezes. She has no rales. She exhibits no tenderness.  Abdominal: Soft. Bowel sounds are normal. She exhibits no distension and no mass. There is no tenderness. There is no rebound and no guarding.  Musculoskeletal: Normal range of motion.  Neurological: She is alert and oriented to person, place, and time.  Skin: Skin is warm and dry. No rash noted. She is not  diaphoretic. No erythema. No pallor.  Psychiatric: She has a normal mood and affect. Her behavior is normal. Judgment and thought content normal.      Lab Results  Component Value Date   WBC 12.3* 04/22/2013   HGB 13.6 04/22/2013   HCT 39.3 04/22/2013   PLT 246 04/22/2013   GLUCOSE 143* 04/22/2013   CHOL 219* 12/29/2009   TRIG 116.0 12/29/2009   HDL 41.40 12/29/2009   LDLDIRECT 171.2 12/29/2009   ALT 13 08/03/2011   AST 13 08/03/2011   NA 138 04/22/2013   K 3.8 04/22/2013   CL 102 04/22/2013   CREATININE 0.80 04/22/2013   BUN 15 04/22/2013   CO2 26 04/22/2013        Assessment & Plan:  Adriel was seen today for diarrhea.  Diagnoses and associated orders for this visit:  Acute gastroenteritis Comments: Likely viral. Zofran for nausea. OTC Imodium. Push fluid hydration, continue electrolyte solution, brat diet, watchful waiting. - ondansetron (ZOFRAN) 4 MG tablet; Take 1 tablet (4 mg total) by mouth every 8 (eight) hours as needed for nausea or vomiting.    Return precautions provided, and patient handout on Diarrhea.  Plan to follow up as needed, or for worsening or persistent symptoms despite treatment.  Patient Instructions  You can take over-the-counter Imodium as directed in order to stop your diarrheal symptoms.  Zofran as directed to help nausea.  Brat diet as discussed. And increase fluids with an occasional electrolyte solution to help prevent dehydration.  If emergency symptoms discussed during visit developed, seek medical attention immediately.  Followup as needed, or for worsening or persistent symptoms despite treatment.

## 2014-09-19 ENCOUNTER — Other Ambulatory Visit: Payer: Self-pay | Admitting: Cardiovascular Disease

## 2014-09-19 NOTE — Telephone Encounter (Signed)
Please go ahead and prescribe. The intention had been for her to receive a prescription after her appointment in October

## 2014-09-19 NOTE — Telephone Encounter (Signed)
Pt need a new prescription for the Chantix please. Please call to [phone]

## 2014-09-19 NOTE — Telephone Encounter (Signed)
LMTCB

## 2014-09-19 NOTE — Telephone Encounter (Signed)
Not on current medication list - sent to Dr. Salena Saner to advise

## 2014-09-20 NOTE — Telephone Encounter (Signed)
LMTCB - starting or continuing dose?

## 2014-09-22 NOTE — Telephone Encounter (Signed)
[facility] is returning a call . Please call

## 2014-09-26 ENCOUNTER — Other Ambulatory Visit: Payer: Self-pay | Admitting: *Deleted

## 2014-09-26 MED ORDER — VARENICLINE TARTRATE 0.5 MG X 11 & 1 MG X 42 PO MISC: ORAL | Status: DC

## 2014-09-26 NOTE — Telephone Encounter (Signed)
Rx was sent to pharmacy electronically. OK per Dr. Salena Saner (triage call)

## 2014-09-26 NOTE — Telephone Encounter (Signed)
Phoned in Rx for Chantix

## 2014-10-10 ENCOUNTER — Encounter: Payer: Self-pay | Admitting: Family

## 2014-10-10 ENCOUNTER — Ambulatory Visit (INDEPENDENT_AMBULATORY_CARE_PROVIDER_SITE_OTHER): Payer: BC Managed Care – PPO | Admitting: Family

## 2014-10-10 VITALS — BP 112/80 | HR 114 | Temp 98.6°F | Wt 203.0 lb

## 2014-10-10 DIAGNOSIS — J069 Acute upper respiratory infection, unspecified: Secondary | ICD-10-CM

## 2014-10-10 DIAGNOSIS — R059 Cough, unspecified: Secondary | ICD-10-CM

## 2014-10-10 DIAGNOSIS — R05 Cough: Secondary | ICD-10-CM

## 2014-10-10 DIAGNOSIS — Z720 Tobacco use: Secondary | ICD-10-CM

## 2014-10-10 MED ORDER — GUAIFENESIN-CODEINE 100-10 MG/5ML PO SYRP: 5.0000 mL | ORAL_SOLUTION | Freq: Three times a day (TID) | ORAL | Status: DC | PRN

## 2014-10-10 NOTE — Progress Notes (Signed)
Subjective:    Patient ID: Andrea [name], female    DOB: May 24, 1981, 33 y.o.   MRN: [id number]  HPI 33 year old white female, one half pack per day smoker is in today with complaints of body aches, cough, congestion 3 days. Has been taken NyQuil and Tylenol Cold and cough that has helped some. Reports vomiting and diarrhea 3 days ago that has since resolved. Denies any sick contacts.   Review of Systems  Constitutional: Negative.   HENT: Positive for congestion, postnasal drip and sore throat. Negative for sinus pressure.   Respiratory: Positive for cough.   Cardiovascular: Negative.   Endocrine: Negative.   Genitourinary: Negative.   Musculoskeletal: Negative.   Skin: Negative.   Neurological: Negative.   Hematological: Negative.   Psychiatric/Behavioral: Negative.      Past Medical History  Diagnosis Date  . Anxiety   . Kidney stones   . Hyperlipidemia   . Abdominal pain   . High cholesterol     History   Social History  . Marital Status: Married    Spouse Name: N/A    Number of Children: N/A  . Years of Education: N/A   Occupational History  . Not on file.   Social History Main Topics  . Smoking status: Current Every Day Smoker -- 1.00 packs/day for 19 years    Types: Cigarettes  . Smokeless tobacco: Never Used  . Alcohol Use: 0.0 oz/week     Comment: rare  . Drug Use: No  . Sexual Activity: Yes    Birth Control/ Protection: Pill   Other Topics Concern  . Not on file   Social History Narrative    Past Surgical History  Procedure Laterality Date  . Cholecystectomy  08/05/11  . Kidney stone surgery      Family History  Problem Relation Age of Onset  . Heart attack Mother   . Hypertension Father   . Heart disease Maternal Uncle   . Heart attack Maternal Uncle   . Heart attack Paternal Aunt     During a stress test  . Heart attack Paternal Uncle   . Heart attack Paternal Grandfather     No Known Allergies  Current Outpatient  Prescriptions on File Prior to Visit  Medication Sig Dispense Refill  . escitalopram (LEXAPRO) 10 MG tablet TAKE 1 TABLET DAILY 90 tablet 3  . ondansetron (ZOFRAN) 4 MG tablet Take 1 tablet (4 mg total) by mouth every 8 (eight) hours as needed for nausea or vomiting. (Patient not taking: Reported on 10/10/2014) 20 tablet 0  . TRINESSA, 28, 0.18/0.215/0.25 MG-35 MCG tablet Take 1 tablet by mouth daily.    . varenicline (CHANTIX STARTING MONTH PAK) 0.5 MG X 11 & 1 MG X 42 tablet Take one 0.5 mg tablet by mouth once daily for 3 days. Increase to one 0.5 mg tablet twice daily for 4 days, then increase to one 1 mg tablet twice daily. (Patient not taking: Reported on 10/10/2014) 53 tablet 0   No current facility-administered medications on file prior to visit.    BP 112/80 mmHg  Pulse 114  Temp(Src) 98.6 F (37 C) (Oral)  Wt 203 lb (92.08 kg)chart    Objective:   Physical Exam  Constitutional: She is oriented to person, place, and time. She appears well-developed and well-nourished.  HENT:  Right Ear: External ear normal.  Left Ear: External ear normal.  Nose: Nose normal.  Mouth/Throat: Oropharynx is clear and moist.  Neck: Normal range of motion.  Neck supple.  Cardiovascular: Normal rate, regular rhythm and normal heart sounds.   Pulmonary/Chest: Effort normal and breath sounds normal.  Musculoskeletal: Normal range of motion.  Neurological: She is alert and oriented to person, place, and time.  Skin: Skin is warm and dry.  Psychiatric: She has a normal mood and affect.          Assessment & Plan:  Andrea [name] was seen today for cough and generalized body aches.  Diagnoses and associated orders for this visit:  Acute upper respiratory infection  Tobacco abuse  Cough  Other Orders - guaiFENesin-codeine (CHERATUSSIN AC) 100-10 MG/5ML syrup; Take 5 mLs by mouth 3 (three) times daily as needed.    Strongly encourage smoking cessation. Over-the-counter symptomatically treated  for relief. Drink plenty of fluids. Call the office with any questions or concerns.

## 2014-10-10 NOTE — Progress Notes (Signed)
Pre visit review using our clinic review tool, if applicable. No additional management support is needed unless otherwise documented below in the visit note. 

## 2014-10-10 NOTE — Patient Instructions (Signed)
Upper Respiratory Infection, Adult An upper respiratory infection (URI) is also sometimes known as the common cold. The upper respiratory tract includes the nose, sinuses, throat, trachea, and bronchi. Bronchi are the airways leading to the lungs. Most people improve within 1 week, but symptoms can last up to 2 weeks. A residual cough may last even longer.  CAUSES Many different viruses can infect the tissues lining the upper respiratory tract. The tissues become irritated and inflamed and often become very moist. Mucus production is also common. A cold is contagious. You can easily spread the virus to others by oral contact. This includes kissing, sharing a glass, coughing, or sneezing. Touching your mouth or nose and then touching a surface, which is then touched by another person, can also spread the virus. SYMPTOMS  Symptoms typically develop 1 to 3 days after you come in contact with a cold virus. Symptoms vary from person to person. They may include:  Runny nose.  Sneezing.  Nasal congestion.  Sinus irritation.  Sore throat.  Loss of voice (laryngitis).  Cough.  Fatigue.  Muscle aches.  Loss of appetite.  Headache.  Low-grade fever. DIAGNOSIS  You might diagnose your own cold based on familiar symptoms, since most people get a cold 2 to 3 times a year. Your caregiver can confirm this based on your exam. Most importantly, your caregiver can check that your symptoms are not due to another disease such as strep throat, sinusitis, pneumonia, asthma, or epiglottitis. Blood tests, throat tests, and X-rays are not necessary to diagnose a common cold, but they may sometimes be helpful in excluding other more serious diseases. Your caregiver will decide if any further tests are required. RISKS AND COMPLICATIONS  You may be at risk for a more severe case of the common cold if you smoke cigarettes, have chronic heart disease (such as heart failure) or lung disease (such as asthma), or if  you have a weakened immune system. The very young and very old are also at risk for more serious infections. Bacterial sinusitis, middle ear infections, and bacterial pneumonia can complicate the common cold. The common cold can worsen asthma and chronic obstructive pulmonary disease (COPD). Sometimes, these complications can require emergency medical care and may be life-threatening. PREVENTION  The best way to protect against getting a cold is to practice good hygiene. Avoid oral or hand contact with people with cold symptoms. Wash your hands often if contact occurs. There is no clear evidence that vitamin C, vitamin E, echinacea, or exercise reduces the chance of developing a cold. However, it is always recommended to get plenty of rest and practice good nutrition. TREATMENT  Treatment is directed at relieving symptoms. There is no cure. Antibiotics are not effective, because the infection is caused by a virus, not by bacteria. Treatment may include:  Increased fluid intake. Sports drinks offer valuable electrolytes, sugars, and fluids.  Breathing heated mist or steam (vaporizer or shower).  Eating chicken soup or other clear broths, and maintaining good nutrition.  Getting plenty of rest.  Using gargles or lozenges for comfort.  Controlling fevers with ibuprofen or acetaminophen as directed by your caregiver.  Increasing usage of your inhaler if you have asthma. Zinc gel and zinc lozenges, taken in the first 24 hours of the common cold, can shorten the duration and lessen the severity of symptoms. Pain medicines may help with fever, muscle aches, and throat pain. A variety of non-prescription medicines are available to treat congestion and runny nose. Your caregiver   can make recommendations and may suggest nasal or lung inhalers for other symptoms.  HOME CARE INSTRUCTIONS   Only take over-the-counter or prescription medicines for pain, discomfort, or fever as directed by your  caregiver.  Use a warm mist humidifier or inhale steam from a shower to increase air moisture. This may keep secretions moist and make it easier to breathe.  Drink enough water and fluids to keep your urine clear or pale yellow.  Rest as needed.  Return to work when your temperature has returned to normal or as your caregiver advises. You may need to stay home longer to avoid infecting others. You can also use a face mask and careful hand washing to prevent spread of the virus. SEEK MEDICAL CARE IF:   After the first few days, you feel you are getting worse rather than better.  You need your caregiver's advice about medicines to control symptoms.  You develop chills, worsening shortness of breath, or brown or red sputum. These may be signs of pneumonia.  You develop yellow or brown nasal discharge or pain in the face, especially when you bend forward. These may be signs of sinusitis.  You develop a fever, swollen neck glands, pain with swallowing, or white areas in the back of your throat. These may be signs of strep throat. SEEK IMMEDIATE MEDICAL CARE IF:   You have a fever.  You develop severe or persistent headache, ear pain, sinus pain, or chest pain.  You develop wheezing, a prolonged cough, cough up blood, or have a change in your usual mucus (if you have chronic lung disease).  You develop sore muscles or a stiff neck. Document Released: 03/26/2001 Document Revised: 12/23/2011 Document Reviewed: 01/05/2014 ExitCare Patient Information 2015 [employer]. This information is not intended to replace advice given to you by your health care provider. Make sure you discuss any questions you have with your health care provider.  

## 2014-10-11 ENCOUNTER — Telehealth: Payer: Self-pay | Admitting: Family Medicine

## 2014-10-11 NOTE — Telephone Encounter (Signed)
emmi mailed  °

## 2014-10-19 ENCOUNTER — Encounter: Payer: Self-pay | Admitting: Family Medicine

## 2014-10-19 ENCOUNTER — Ambulatory Visit (INDEPENDENT_AMBULATORY_CARE_PROVIDER_SITE_OTHER): Payer: BLUE CROSS/BLUE SHIELD | Admitting: Family Medicine

## 2014-10-19 VITALS — BP 117/84 | HR 95 | Temp 98.3°F | Ht 61.0 in | Wt 207.0 lb

## 2014-10-19 DIAGNOSIS — J0100 Acute maxillary sinusitis, unspecified: Secondary | ICD-10-CM

## 2014-10-19 MED ORDER — HYDROCODONE-HOMATROPINE 5-1.5 MG/5ML PO SYRP: 5.0000 mL | ORAL_SOLUTION | ORAL | Status: DC | PRN

## 2014-10-19 MED ORDER — AZITHROMYCIN 250 MG PO TABS: ORAL_TABLET | ORAL | Status: DC

## 2014-10-19 NOTE — Progress Notes (Signed)
Pre visit review using our clinic review tool, if applicable. No additional management support is needed unless otherwise documented below in the visit note. 

## 2014-10-19 NOTE — Progress Notes (Signed)
   Subjective:    Patient ID: Andrea MenghiniLauren A [name], female    DOB: 1981/07/30, 34 y.o.   MRN: [id number]  HPI Here for 2 weeks of sinus pressure, PND, ST, and a dry cough. She had fever and body aches at the start but these have resolved.    Review of Systems  Constitutional: Negative.   HENT: Positive for congestion, postnasal drip and sinus pressure.   Eyes: Negative.   Respiratory: Positive for cough.        Objective:   Physical Exam  Constitutional: She appears well-developed and well-nourished.  HENT:  Right Ear: External ear normal.  Left Ear: External ear normal.  Nose: Nose normal.  Mouth/Throat: Oropharynx is clear and moist.  Eyes: Conjunctivae are normal.  Pulmonary/Chest: Effort normal and breath sounds normal.  Lymphadenopathy:    She has no cervical adenopathy.          Assessment & Plan:  This is sinusitis that probably started as influenza. Treat with a Zpack.

## 2015-03-06 ENCOUNTER — Encounter: Payer: Self-pay | Admitting: Family Medicine

## 2015-03-06 ENCOUNTER — Ambulatory Visit (INDEPENDENT_AMBULATORY_CARE_PROVIDER_SITE_OTHER): Payer: BLUE CROSS/BLUE SHIELD | Admitting: Family Medicine

## 2015-03-06 VITALS — BP 109/76 | HR 93 | Temp 98.8°F | Ht 61.0 in | Wt 195.0 lb

## 2015-03-06 DIAGNOSIS — K529 Noninfective gastroenteritis and colitis, unspecified: Secondary | ICD-10-CM | POA: Diagnosis not present

## 2015-03-06 MED ORDER — METRONIDAZOLE 500 MG PO TABS: 500.0000 mg | ORAL_TABLET | Freq: Three times a day (TID) | ORAL | Status: DC

## 2015-03-06 MED ORDER — FLUCONAZOLE 150 MG PO TABS: 150.0000 mg | ORAL_TABLET | Freq: Every day | ORAL | Status: DC

## 2015-03-06 NOTE — Progress Notes (Signed)
Pre visit review using our clinic review tool, if applicable. No additional management support is needed unless otherwise documented below in the visit note. 

## 2015-03-06 NOTE — Progress Notes (Signed)
   Subjective:    Patient ID: Andrea [name], female    DOB: June 22, 1981, 34 y.o.   MRN: [id number]  HPI Here for 5 days of mild abdominal cramps, nausea without vomiting, and diarrhea. No fevers. No recent travels.    Review of Systems  Constitutional: Negative.   Respiratory: Negative.   Gastrointestinal: Positive for nausea, abdominal pain and diarrhea. Negative for vomiting, constipation, blood in stool, abdominal distention, anal bleeding and rectal pain.       Objective:   Physical Exam  Constitutional: She appears well-developed and well-nourished. No distress.  Abdominal: Soft. Bowel sounds are normal. She exhibits no distension and no mass. There is no rebound and no guarding.  Mild generalized tenderness           Assessment & Plan:  Enteritis. Drink fluids. Use Flagyl.

## 2015-03-15 ENCOUNTER — Encounter: Payer: Self-pay | Admitting: *Deleted

## 2015-10-31 ENCOUNTER — Telehealth: Payer: Self-pay | Admitting: Cardiovascular Disease

## 2015-10-31 NOTE — Telephone Encounter (Signed)
Pt says she needs a new prescription for a full face mask for her C Pap machine please. Please send to [facility]-their phone number is [phone].

## 2015-11-07 NOTE — Telephone Encounter (Signed)
Pt says she is still waiting to hear something,she really needs her mask.

## 2015-11-07 NOTE — Telephone Encounter (Signed)
Order faxed to [facility] for full face mask.

## 2016-02-22 ENCOUNTER — Ambulatory Visit (INDEPENDENT_AMBULATORY_CARE_PROVIDER_SITE_OTHER): Payer: Managed Care, Other (non HMO) | Admitting: Physician Assistant

## 2016-02-22 ENCOUNTER — Ambulatory Visit (INDEPENDENT_AMBULATORY_CARE_PROVIDER_SITE_OTHER): Payer: Managed Care, Other (non HMO)

## 2016-02-22 VITALS — BP 122/80 | HR 100 | Temp 98.3°F | Resp 18 | Ht 61.0 in | Wt 197.0 lb

## 2016-02-22 DIAGNOSIS — R102 Pelvic and perineal pain: Secondary | ICD-10-CM

## 2016-02-22 DIAGNOSIS — F411 Generalized anxiety disorder: Secondary | ICD-10-CM | POA: Insufficient documentation

## 2016-02-22 DIAGNOSIS — E119 Type 2 diabetes mellitus without complications: Secondary | ICD-10-CM

## 2016-02-22 DIAGNOSIS — F419 Anxiety disorder, unspecified: Secondary | ICD-10-CM

## 2016-02-22 DIAGNOSIS — R109 Unspecified abdominal pain: Secondary | ICD-10-CM

## 2016-02-22 DIAGNOSIS — R81 Glycosuria: Secondary | ICD-10-CM | POA: Diagnosis not present

## 2016-02-22 LAB — CBC WITH DIFFERENTIAL/PLATELET
Basophils Absolute: 0 cells/uL (ref 0–200)
Basophils Relative: 0 %
Eosinophils Absolute: 230 cells/uL (ref 15–500)
Eosinophils Relative: 2 %
HCT: 44.2 % (ref 35.0–45.0)
Hemoglobin: 15.2 g/dL (ref 11.7–15.5)
Lymphocytes Relative: 29 %
Lymphs Abs: 3335 cells/uL (ref 850–3900)
MCH: 30.8 pg (ref 27.0–33.0)
MCHC: 34.4 g/dL (ref 32.0–36.0)
MCV: 89.7 fL (ref 80.0–100.0)
MPV: 10.8 fL (ref 7.5–12.5)
Monocytes Absolute: 690 cells/uL (ref 200–950)
Monocytes Relative: 6 %
Neutro Abs: 7245 cells/uL (ref 1500–7800)
Neutrophils Relative %: 63 %
Platelets: 279 10*3/uL (ref 140–400)
RBC: 4.93 MIL/uL (ref 3.80–5.10)
RDW: 12.5 % (ref 11.0–15.0)
WBC: 11.5 10*3/uL — ABNORMAL HIGH (ref 3.8–10.8)

## 2016-02-22 LAB — POCT GLYCOSYLATED HEMOGLOBIN (HGB A1C): Hemoglobin A1C: 8.5

## 2016-02-22 LAB — POC MICROSCOPIC URINALYSIS (UMFC): Mucus: ABSENT

## 2016-02-22 LAB — POCT URINALYSIS DIP (MANUAL ENTRY)
Bilirubin, UA: NEGATIVE
Glucose, UA: 500 — AB
Leukocytes, UA: NEGATIVE
Nitrite, UA: NEGATIVE
Protein Ur, POC: NEGATIVE
Spec Grav, UA: 1.015
Urobilinogen, UA: 0.2
pH, UA: 5

## 2016-02-22 LAB — POCT URINE PREGNANCY: Preg Test, Ur: NEGATIVE

## 2016-02-22 LAB — GLUCOSE, POCT (MANUAL RESULT ENTRY): POC Glucose: 242 mg/dl — AB (ref 70–99)

## 2016-02-22 MED ORDER — ESCITALOPRAM OXALATE 20 MG PO TABS: 20.0000 mg | ORAL_TABLET | Freq: Every day | ORAL | Status: DC

## 2016-02-22 MED ORDER — METFORMIN HCL 500 MG PO TABS: 500.0000 mg | ORAL_TABLET | Freq: Two times a day (BID) | ORAL | Status: DC

## 2016-02-22 NOTE — Patient Instructions (Addendum)
1. The X-ray shows no stones, but only about 50% of kidney stones show up on X-ray, so that is still a possibility. If the pain persists, we'll want to get a CT scan. In the meantime, I recommend OTC Miralax each day, as there is a moderate amount of stool in the colon which can cause pain like you are having.  2. When starting the metformin, take it just one time each day for the first week, then increase to twice daily (usually with breakfast and supper).  [facility] is going to contact you to schedule classes to learn more about diabetes and how to manage it.  You should follow-up with Dr. Clent RidgesFry in about 3 months to see how the treatment is going and to adjust it if needed.    IF you received an x-ray today, you will receive [facility]. Please contact [facility] at [phone] with questions or concerns regarding your invoice.   IF you received labwork today, you will receive an invoice from [facility]. Please contact [facility] at [phone] with questions or concerns regarding your invoice.   Our billing staff will not be able to assist you with questions regarding bills from these companies.  You will be contacted with the lab results as soon as they are available. The fastest way to get your results is to activate your My Chart account. Instructions are located on the last page of this paperwork. If you have not heard from us regarding the results in 2 weeks, please contact this office.

## 2016-02-22 NOTE — Progress Notes (Signed)
Patient ID: Andrea MenghiniLauren A Perlow, female    DOB: May 07, 1981, 35 y.o.   MRN: [id number]  PCP: Nelwyn SalisburyFRY,STEPHEN A, MD  Subjective:   Chief Complaint  Patient presents with  . Abdominal Pain    right side    HPI Presents for evaluation of RIGHT flank pain.  "The weirdest pain hit me this morning." "Started in the car on the way to work, but then it kept happening." "Comes and goes." Developed nausea and started to feel hot. No vomiting. 2 days ago she had a little bit of diarrhea, but that's not unusual since cholecystectomy. Always has urgency of urination, with some stress and urge incontinence, not worse. No frequency, dysuria, hematuria. No unexplained myalgias/arthralgias. Pain lasts 15 seconds, rated 5-6/10, recurring every 5 minutes until she got here.   Review of Systems As above. No fever/chills. No CP, SOB, HA, Dizziness.    Patient Active Problem List   Diagnosis Date Noted  . Anxiety 02/22/2016  . Chest pain on exertion 05/02/2013  . Obstructive sleep apnea 05/02/2013  . Obesity (BMI 35.0-39.9 without comorbidity) (HCC) 05/02/2013  . Tobacco abuse 05/02/2013  . ECZEMA, ATOPIC 03/26/2010     Prior to Admission medications   Medication Sig Start Date End Date Taking? Authorizing Provider  escitalopram (LEXAPRO) 20 MG tablet Take 20 mg by mouth daily.   Yes Historical Provider, MD     No Known Allergies     Objective:  Physical Exam  Constitutional: She is oriented to person, place, and time. She appears well-developed and well-nourished. She is active and cooperative. No distress.  BP 122/80 mmHg  Pulse 100  Temp(Src) 98.3 F (36.8 C) (Oral)  Resp 18  Ht 5\' 1"  (1.549 m)  Wt 197 lb (89.359 kg)  BMI 37.24 kg/m2  SpO2 96%  LMP 02/19/2016  HENT:  Head: Normocephalic and atraumatic.  Right Ear: Hearing normal.  Left Ear: Hearing normal.  Eyes: Conjunctivae are normal. No scleral icterus.  Neck: Normal range of motion. Neck supple. No thyromegaly  present.  Cardiovascular: Normal rate, regular rhythm and normal heart sounds.   Pulses:      Radial pulses are 2+ on the right side, and 2+ on the left side.  Pulmonary/Chest: Effort normal and breath sounds normal.  Abdominal: Normal appearance and bowel sounds are normal. There is no hepatosplenomegaly. There is tenderness in the epigastric area and suprapubic area. There is no CVA tenderness.  Lymphadenopathy:       Head (right side): No tonsillar, no preauricular, no posterior auricular and no occipital adenopathy present.       Head (left side): No tonsillar, no preauricular, no posterior auricular and no occipital adenopathy present.    She has no cervical adenopathy.       Right: No supraclavicular adenopathy present.       Left: No supraclavicular adenopathy present.  Neurological: She is alert and oriented to person, place, and time. No sensory deficit.  Skin: Skin is warm, dry and intact. No rash noted. No cyanosis or erythema. Nails show no clubbing.  Psychiatric: She has a normal mood and affect. Her speech is normal and behavior is normal.       Results for orders placed or performed in visit on 02/22/16  POCT urinalysis dipstick  Result Value Ref Range   Color, UA yellow yellow   Clarity, UA clear clear   Glucose, UA =500 (A) negative   Bilirubin, UA negative negative   Ketones, POC UA trace (5) (A)  negative   Spec Grav, UA 1.015    Blood, UA trace-lysed (A) negative   pH, UA 5.0    Protein Ur, POC negative negative   Urobilinogen, UA 0.2    Nitrite, UA Negative Negative   Leukocytes, UA Negative Negative  POCT Microscopic Urinalysis ([facility])  Result Value Ref Range   WBC,UR,HPF,POC Few (A) None WBC/hpf   RBC,UR,HPF,POC Few (A) None RBC/hpf   Bacteria Few (A) None, Too numerous to count   Mucus Absent Absent   Epithelial Cells, UR Per Microscopy Few (A) None, Too numerous to count cells/hpf  POCT urine pregnancy  Result Value Ref Range   Preg Test, Ur Negative  Negative  POCT glucose (manual entry)  Result Value Ref Range   POC Glucose 242 (A) 70 - 99 mg/dl  POCT glycosylated hemoglobin (Hb A1C)  Result Value Ref Range   Hemoglobin A1C 8.5      Dg Abd Acute W/chest  02/22/2016  CLINICAL DATA:  Right flank pain ; onset of nausea and vomiting this morning EXAM: DG ABDOMEN ACUTE W/ 1V CHEST COMPARISON:  Chest x-ray of April 22, 2013 and abdominal and pelvic CT scan of August 03, 2011. FINDINGS: The lungs are adequately inflated and clear. The heart and mediastinal structures are normal. There is no pleural effusion. The bony thorax is unremarkable. Within the abdomen the colonic stool burden is moderate. There is no small or large bowel obstruction. No kidney stones are observed. There surgical clips in the gallbladder fossa. An IUD is present. A tampon is present in the vagina. The bony structures are unremarkable. IMPRESSION: 1. No acute cardiopulmonary abnormality. 2. Moderately increased colonic stool burden may reflect mild constipation in the appropriate clinical setting. Electronically Signed   By: David  [place] M.D.   On: 02/22/2016 11:26       Assessment & Plan:   1. Flank pain 2. Pelvic pain in female Uncertain etiology. Possibly constipation. Cannot exclude nephrolithiasis as cause, although this feels different from previous stone. Trial of OTC Miralax. If symptoms persist, would pursue CT scan. - POCT urinalysis dipstick - POCT Microscopic Urinalysis ([facility]) - POCT urine pregnancy - DG Abd Acute W/Chest; Future - CBC with Differential/Platelet   3. Anxiety I agreed to refill this for her, as she is here. She will follow-up with PCP. - escitalopram (LEXAPRO) 20 MG tablet; Take 1 tablet (20 mg total) by mouth daily.  Dispense: 90 tablet; Refill: 0  4. Glucosuria Incidental finding on UA to assess flank pain. - POCT glucose (manual entry) - POCT glycosylated hemoglobin (Hb A1C)  5. Type 2 diabetes mellitus without complication,  without long-term current use of insulin (HCC) New diagnosis. Start metformin. Diabetes education. Follow-up with PCP. - metFORMIN (GLUCOPHAGE) 500 MG tablet; Take 1 tablet (500 mg total) by mouth 2 (two) times daily with a meal.  Dispense: 180 tablet; Refill: 0   Fernande Bras, PA-C Physician Assistant-Certified [facility]

## 2016-03-13 ENCOUNTER — Other Ambulatory Visit: Payer: Self-pay

## 2016-03-13 DIAGNOSIS — F419 Anxiety disorder, unspecified: Secondary | ICD-10-CM

## 2016-03-13 DIAGNOSIS — E119 Type 2 diabetes mellitus without complications: Secondary | ICD-10-CM

## 2016-03-13 MED ORDER — METFORMIN HCL 500 MG PO TABS: 500.0000 mg | ORAL_TABLET | Freq: Two times a day (BID) | ORAL | Status: DC

## 2016-03-13 MED ORDER — ESCITALOPRAM OXALATE 20 MG PO TABS: 20.0000 mg | ORAL_TABLET | Freq: Every day | ORAL | Status: DC

## 2016-03-27 ENCOUNTER — Encounter: Payer: Self-pay | Admitting: Family Medicine

## 2016-03-27 ENCOUNTER — Ambulatory Visit (INDEPENDENT_AMBULATORY_CARE_PROVIDER_SITE_OTHER): Payer: Managed Care, Other (non HMO) | Admitting: Family Medicine

## 2016-03-27 VITALS — BP 113/73 | HR 92 | Temp 98.8°F | Ht 61.0 in | Wt 191.0 lb

## 2016-03-27 DIAGNOSIS — F419 Anxiety disorder, unspecified: Secondary | ICD-10-CM

## 2016-03-27 DIAGNOSIS — R1031 Right lower quadrant pain: Secondary | ICD-10-CM

## 2016-03-27 DIAGNOSIS — E119 Type 2 diabetes mellitus without complications: Secondary | ICD-10-CM

## 2016-03-27 LAB — GLUCOSE, POCT (MANUAL RESULT ENTRY): POC Glucose: 118 mg/dl — AB (ref 70–99)

## 2016-03-27 MED ORDER — BUPROPION HCL ER (XL) 150 MG PO TB24: 150.0000 mg | ORAL_TABLET | Freq: Every day | ORAL | Status: DC

## 2016-03-27 NOTE — Progress Notes (Signed)
Pre visit review using our clinic review tool, if applicable. No additional management support is needed unless otherwise documented below in the visit note. 

## 2016-03-27 NOTE — Progress Notes (Signed)
   Subjective:    Patient ID: Andrea Andrea [name], female    DOB: October 09, 1981, 35 y.o.   MRN: [id number]  HPI Here to follow up on Andrea [name] new diagnosis of diabetes type 2 and for other issues. On 02-22-16 she went to Urgent Care for Andrea [name] right sided abdominal pain, and while there labs revealed Andrea [name] random glucose of 242. They then did an A1c which came back at 8.5. She was told she had diabetes and she was started on Metformin 500 mg bid. She has been taking this regularly, and she has greatly changed her diet. She has reduced her carbohydrate intake dramatically and she has started exercising on an eliptical machine. The abdominal pain was felt to be from constipation and she tried some Miralax. This worked well and the pain quickly resolved. Also she complains about Andrea [name] lack of libido. This became an issue about one month ago, and it has stressed her marriage Andrea [name] bit. She started taking Lexapro 20 mg daily on Andrea [name] regular basis about 2 and 1/2 months ago, and this has helped her anxiety. Appetite and sleep are stable. Her glucose today one hour after eating Andrea [name] protein bar is 118.    Review of Systems  Constitutional: Negative.   Respiratory: Negative.   Cardiovascular: Negative.   Neurological: Negative.   Psychiatric/Behavioral: Negative for hallucinations, confusion, sleep disturbance, dysphoric mood, decreased concentration and agitation. The patient is nervous/anxious.        Objective:   Physical Exam  Constitutional: She is oriented to person, place, and time. She appears well-developed and well-nourished. No distress.  Neck: No thyromegaly present.  Cardiovascular: Normal rate, regular rhythm, normal heart sounds and intact distal pulses.   Pulmonary/Chest: Effort normal and breath sounds normal.  Abdominal: Soft. Bowel sounds are normal. She exhibits no distension and no mass. There is no tenderness. There is no rebound and no guarding.  Lymphadenopathy:    She has no cervical adenopathy.  Neurological:  She is alert and oriented to person, place, and time.  Psychiatric: She has Andrea [name] normal mood and affect. Her behavior is normal. Thought content normal.          Assessment & Plan:  Her recently diagnosed diabetes is now under much better control. The Urgent Care staff had put in Andrea [name] referral to Nutrition, and I urged Andrea [name] to call and make an appt for this. She will continue her current regimen and we will follow up with her in one month. Repeat an A1c in 2 months. The abdominal pain has resolved. As for the lack of libido, this is most likely Andrea [name] side effect of the Lexapro. We will taper her off this by taking 1/2 of Andrea [name] tablet daily for 7 days and then stopping it. After waiting an additional week she will start taking Wellbutrin XL 150 mg daily. We will review this in one month as well.  Andrea Andrea [name],Andrea Knoche A, MD

## 2016-04-15 ENCOUNTER — Telehealth: Payer: Self-pay | Admitting: Family Medicine

## 2016-04-15 NOTE — Telephone Encounter (Signed)
Pt was switched to buPROPion (WELLBUTRIN XL) 150 MG 24 hr tablet 6/14  and states it is not working at all. Pt feels worse than ever.  Pt states this is definitely not  the medicine for her.  Would like to try another med or last resort go back on the Lexapro.  [facility] [address]

## 2016-04-15 NOTE — Telephone Encounter (Signed)
Pt has tried Lexapro & Celexa in the past, she would like to ask about maybe trying Zoloft?

## 2016-04-15 NOTE — Telephone Encounter (Signed)
Stop the Wellbutrin. Call in Celexa 20 mg daily, #30 with 2 rf

## 2016-04-17 MED ORDER — SERTRALINE HCL 50 MG PO TABS: 50.0000 mg | ORAL_TABLET | Freq: Every day | ORAL | Status: DC

## 2016-04-17 NOTE — Telephone Encounter (Signed)
I spoke with pt, sent new script e-scribe and removed Celexa from current medication list.

## 2016-04-17 NOTE — Telephone Encounter (Signed)
Okay, cancel the Celexa. Call in Zoloft 50 mg to take daily, #30 with 2 rf

## 2016-04-18 ENCOUNTER — Other Ambulatory Visit: Payer: Self-pay

## 2016-05-30 ENCOUNTER — Other Ambulatory Visit: Payer: Self-pay | Admitting: Physician Assistant

## 2016-05-30 DIAGNOSIS — E119 Type 2 diabetes mellitus without complications: Secondary | ICD-10-CM

## 2016-06-26 ENCOUNTER — Encounter: Payer: Self-pay | Admitting: Family Medicine

## 2016-06-26 DIAGNOSIS — E119 Type 2 diabetes mellitus without complications: Secondary | ICD-10-CM

## 2016-06-27 MED ORDER — METFORMIN HCL 500 MG PO TABS: 500.0000 mg | ORAL_TABLET | Freq: Two times a day (BID) | ORAL | 0 refills | Status: DC

## 2016-07-01 ENCOUNTER — Other Ambulatory Visit: Payer: Self-pay | Admitting: Family Medicine

## 2016-07-01 ENCOUNTER — Encounter: Payer: Self-pay | Admitting: Family Medicine

## 2016-07-01 DIAGNOSIS — E119 Type 2 diabetes mellitus without complications: Secondary | ICD-10-CM

## 2016-07-02 MED ORDER — METFORMIN HCL 500 MG PO TABS: 500.0000 mg | ORAL_TABLET | Freq: Two times a day (BID) | ORAL | 0 refills | Status: DC

## 2016-07-02 NOTE — Telephone Encounter (Signed)
Medication called/sent to requested pharmacy and pt aware via mychart 

## 2016-07-09 ENCOUNTER — Ambulatory Visit: Payer: Managed Care, Other (non HMO) | Admitting: Family Medicine

## 2016-07-17 ENCOUNTER — Ambulatory Visit: Payer: Managed Care, Other (non HMO) | Admitting: Family Medicine

## 2016-07-29 ENCOUNTER — Encounter: Payer: Self-pay | Admitting: Family Medicine

## 2016-07-29 ENCOUNTER — Ambulatory Visit (INDEPENDENT_AMBULATORY_CARE_PROVIDER_SITE_OTHER): Payer: Managed Care, Other (non HMO) | Admitting: Family Medicine

## 2016-07-29 VITALS — BP 118/68 | HR 84 | Temp 98.7°F | Resp 12 | Ht 61.0 in | Wt 180.0 lb

## 2016-07-29 DIAGNOSIS — G4733 Obstructive sleep apnea (adult) (pediatric): Secondary | ICD-10-CM | POA: Diagnosis not present

## 2016-07-29 DIAGNOSIS — L301 Dyshidrosis [pompholyx]: Secondary | ICD-10-CM

## 2016-07-29 DIAGNOSIS — E6609 Other obesity due to excess calories: Secondary | ICD-10-CM

## 2016-07-29 DIAGNOSIS — E119 Type 2 diabetes mellitus without complications: Secondary | ICD-10-CM | POA: Diagnosis not present

## 2016-07-29 DIAGNOSIS — Z6834 Body mass index (BMI) 34.0-34.9, adult: Secondary | ICD-10-CM | POA: Diagnosis not present

## 2016-07-29 DIAGNOSIS — Z720 Tobacco use: Secondary | ICD-10-CM | POA: Diagnosis not present

## 2016-07-29 DIAGNOSIS — Z23 Encounter for immunization: Secondary | ICD-10-CM | POA: Diagnosis not present

## 2016-07-29 DIAGNOSIS — E66811 Obesity, class 1: Secondary | ICD-10-CM

## 2016-07-29 MED ORDER — METFORMIN HCL 500 MG PO TABS: 500.0000 mg | ORAL_TABLET | Freq: Two times a day (BID) | ORAL | 2 refills | Status: DC

## 2016-07-29 MED ORDER — VARENICLINE TARTRATE 0.5 MG X 11 & 1 MG X 42 PO MISC: ORAL | 0 refills | Status: DC

## 2016-07-29 MED ORDER — BLOOD GLUCOSE TEST VI STRP: ORAL_STRIP | 3 refills | Status: DC

## 2016-07-29 MED ORDER — CRISABOROLE 2 % EX OINT: 1.0000 "application " | TOPICAL_OINTMENT | Freq: Two times a day (BID) | CUTANEOUS | 2 refills | Status: DC | PRN

## 2016-07-29 NOTE — Assessment & Plan Note (Signed)
Recommend that she use her CPAP on a regular basis at least 4 hours every night

## 2016-07-29 NOTE — Assessment & Plan Note (Signed)
Counseled on tobacco cessation she would like to try Chantix

## 2016-07-29 NOTE — Assessment & Plan Note (Signed)
Trial of [place]

## 2016-07-29 NOTE — Addendum Note (Signed)
Addended by: Phillips OdorSIX, CHRISTINA H on: 07/29/2016 04:37 PM   Modules accepted: Orders

## 2016-07-29 NOTE — Progress Notes (Signed)
Subjective:    Patient ID: Andrea [name], female    DOB: [birth year], 35 y.o.   MRN: [id number]  Patient presents for [facility] (is not fasting)  Patient here to establish care. Previous PCP Dr. Clent Ridges. GYN is Dr. Curlene Labrum She is briefly diagnosed with diabetes mellitus back in May 2017 she went to the urgent care with some flank discomfort was found to have glucose in her urine for A1c result at 8.5 she was started on metformin 500 mg twice a day she does not check her blood sugars. She states that it is painful.  Tobacco use she smokes a pack per day she would like to try Chantix she has tried this in the past and not having side effects but was not mentally ready to quit.  Sleep apnea issue over struck sleep apnea this was diagnosed by cardiology a few years ago. She has a CPAP machine but is not using regularly.  Eczema she has history of dyshidrotic eczema she was described topical steroid in the past but that did not help very much. She does wear gloves at work and has to wash her hands in between which causes things to flareup. She also has is on her feet.  obesityshe has intentionally lost 25 pounds showing to correct her diabetes mellitus she has not started exercising but just changing her diet.   Review Of Systems:  GEN- denies fatigue, fever, weight loss,weakness, recent illness HEENT- denies eye drainage, change in vision, nasal discharge, CVS- denies chest pain, palpitations RESP- denies SOB, cough, wheeze ABD- denies N/V, change in stools, abd pain GU- denies dysuria, hematuria, dribbling, incontinence MSK- denies joint pain, muscle aches, injury Neuro- denies headache, dizziness, syncope, seizure activity       Objective:    BP 118/68 (BP Location: Left Arm, Patient Position: Sitting, Cuff Size: Normal)   Pulse 84   Temp 98.7 F (37.1 C) (Oral)   Resp 12   Ht 5\' 1"  (1.549 m)   Wt 180 lb (81.6 kg)   BMI 34.01 kg/m  GEN- NAD, alert and  oriented x3 HEENT- PERRL, EOMI, non injected sclera, pink conjunctiva, MMM, oropharynx clear Neck- Supple, no thyromegaly CVS- RRR, no murmur RESP-CTAB ABD-NABS,soft,NT,ND Skin- mild eczemous rash on bilat palms and sides of fingers EXT- No edema Pulses- Radial, DP- 2+        Assessment & Plan:      Problem List Items Addressed This Visit    Tobacco abuse    Counseled on tobacco cessation she would like to try Chantix      Obstructive sleep apnea - Primary    Recommend that she use her CPAP on a regular basis at least 4 hours every night      Obesity   Relevant Medications   metFORMIN (GLUCOPHAGE) 500 MG tablet   Eczema, dyshidrotic    Trial of Eucrisa      Diabetes mellitus type 2, uncomplicated (HCC)    New onset diabetes mellitus she is lost 25 pounds intentionally to try to correct her diabetes. She may be able to slowly wean off of the metformin. Discussed checking her blood sugars at least 2-3 times a week or she feels ill. She is not on ACE inhibitor or statin drug just yet we can get her off of the metformin she may not need these additional medications. Labs to be drawn today she will have her lipids drawn in January she is not fasting  Relevant Medications   metFORMIN (GLUCOPHAGE) 500 MG tablet   Other Relevant Orders   CBC with Differential/Platelet   Comprehensive metabolic panel   Hemoglobin A1c    Other Visit Diagnoses   None.     Note: This dictation was prepared with Dragon dictation along with smaller phrase technology. Any transcriptional errors that result from this process are unintentional.

## 2016-07-29 NOTE — Addendum Note (Signed)
Addended by: Phillips OdorSIX, Sally-Anne Wamble H on: 07/29/2016 05:34 PM   Modules accepted: Orders

## 2016-07-29 NOTE — Patient Instructions (Addendum)
F/U  3 months - fasting morning appt  Release of records- Dr. Ambrose MantleHenley

## 2016-07-29 NOTE — Assessment & Plan Note (Addendum)
New onset diabetes mellitus she is lost 25 pounds intentionally to try to correct her diabetes. She may be able to slowly wean off of the metformin. Discussed checking her blood sugars at least 2-3 times a week or she feels ill. She is not on ACE inhibitor or statin drug just yet we can get her off of the metformin she may not need these additional medications. Labs to be drawn today she will have her lipids drawn in January she is not fasting   Given meter from office

## 2016-07-30 ENCOUNTER — Other Ambulatory Visit: Payer: Self-pay | Admitting: *Deleted

## 2016-07-30 DIAGNOSIS — E119 Type 2 diabetes mellitus without complications: Secondary | ICD-10-CM

## 2016-07-30 DIAGNOSIS — Z13220 Encounter for screening for lipoid disorders: Secondary | ICD-10-CM

## 2016-07-30 LAB — CBC WITH DIFFERENTIAL/PLATELET
Basophils Absolute: 0 cells/uL (ref 0–200)
Basophils Relative: 0 %
Eosinophils Absolute: 396 cells/uL (ref 15–500)
Eosinophils Relative: 3 %
HCT: 42.1 % (ref 35.0–45.0)
Hemoglobin: 14 g/dL (ref 12.0–15.0)
Lymphocytes Relative: 27 %
Lymphs Abs: 3564 cells/uL (ref 850–3900)
MCH: 29.5 pg (ref 27.0–33.0)
MCHC: 33.3 g/dL (ref 32.0–36.0)
MCV: 88.8 fL (ref 80.0–100.0)
MPV: 10 fL (ref 7.5–12.5)
Monocytes Absolute: 924 cells/uL (ref 200–950)
Monocytes Relative: 7 %
Neutro Abs: 8316 cells/uL — ABNORMAL HIGH (ref 1500–7800)
Neutrophils Relative %: 63 %
Platelets: 285 10*3/uL (ref 140–400)
RBC: 4.74 MIL/uL (ref 3.80–5.10)
RDW: 12.8 % (ref 11.0–15.0)
WBC: 13.2 10*3/uL — ABNORMAL HIGH (ref 3.8–10.8)

## 2016-07-30 LAB — COMPREHENSIVE METABOLIC PANEL
ALT: 20 U/L (ref 6–29)
AST: 14 U/L (ref 10–30)
Albumin: 4.1 g/dL (ref 3.6–5.1)
Alkaline Phosphatase: 80 U/L (ref 33–115)
BUN: 15 mg/dL (ref 7–25)
CO2: 23 mmol/L (ref 20–31)
Calcium: 9.6 mg/dL (ref 8.6–10.2)
Chloride: 106 mmol/L (ref 98–110)
Creat: 0.68 mg/dL (ref 0.50–1.10)
Glucose, Bld: 97 mg/dL (ref 70–99)
Potassium: 4.3 mmol/L (ref 3.5–5.3)
Sodium: 139 mmol/L (ref 135–146)
Total Bilirubin: 0.4 mg/dL (ref 0.2–1.2)
Total Protein: 7 g/dL (ref 6.1–8.1)

## 2016-07-30 LAB — HEMOGLOBIN A1C
Hgb A1c MFr Bld: 5.5 % (ref <5.7)
Mean Plasma Glucose: 111 mg/dL

## 2016-08-02 ENCOUNTER — Telehealth: Payer: Self-pay | Admitting: *Deleted

## 2016-08-02 NOTE — Telephone Encounter (Signed)
Received call from pharmacy.   Reports that Accu-Chek is not covered by insurance.   Requested order to change to One Touch Ultra Blue. VO given.

## 2016-10-28 ENCOUNTER — Other Ambulatory Visit: Payer: Managed Care, Other (non HMO)

## 2016-10-28 DIAGNOSIS — E119 Type 2 diabetes mellitus without complications: Secondary | ICD-10-CM

## 2016-10-28 DIAGNOSIS — Z13220 Encounter for screening for lipoid disorders: Secondary | ICD-10-CM

## 2016-10-28 LAB — COMPREHENSIVE METABOLIC PANEL
ALT: 15 U/L (ref 6–29)
AST: 11 U/L (ref 10–30)
Albumin: 4 g/dL (ref 3.6–5.1)
Alkaline Phosphatase: 64 U/L (ref 33–115)
BUN: 13 mg/dL (ref 7–25)
CO2: 24 mmol/L (ref 20–31)
Calcium: 9.1 mg/dL (ref 8.6–10.2)
Chloride: 107 mmol/L (ref 98–110)
Creat: 0.66 mg/dL (ref 0.50–1.10)
Glucose, Bld: 113 mg/dL — ABNORMAL HIGH (ref 70–99)
Potassium: 5 mmol/L (ref 3.5–5.3)
Sodium: 141 mmol/L (ref 135–146)
Total Bilirubin: 0.3 mg/dL (ref 0.2–1.2)
Total Protein: 6.4 g/dL (ref 6.1–8.1)

## 2016-10-28 LAB — CBC WITH DIFFERENTIAL/PLATELET
Basophils Absolute: 109 cells/uL (ref 0–200)
Basophils Relative: 1 %
Eosinophils Absolute: 218 cells/uL (ref 15–500)
Eosinophils Relative: 2 %
HCT: 42.6 % (ref 35.0–45.0)
Hemoglobin: 14.1 g/dL (ref 12.0–15.0)
Lymphocytes Relative: 32 %
Lymphs Abs: 3488 cells/uL (ref 850–3900)
MCH: 29.6 pg (ref 27.0–33.0)
MCHC: 33.1 g/dL (ref 32.0–36.0)
MCV: 89.3 fL (ref 80.0–100.0)
MPV: 10.6 fL (ref 7.5–12.5)
Monocytes Absolute: 763 cells/uL (ref 200–950)
Monocytes Relative: 7 %
Neutro Abs: 6322 cells/uL (ref 1500–7800)
Neutrophils Relative %: 58 %
Platelets: 260 10*3/uL (ref 140–400)
RBC: 4.77 MIL/uL (ref 3.80–5.10)
RDW: 12.9 % (ref 11.0–15.0)
WBC: 10.9 10*3/uL — ABNORMAL HIGH (ref 3.8–10.8)

## 2016-10-28 LAB — HEMOGLOBIN A1C
Hgb A1c MFr Bld: 5.6 % (ref <5.7)
Mean Plasma Glucose: 114 mg/dL

## 2016-10-28 LAB — LIPID PANEL
Cholesterol: 181 mg/dL (ref <200)
HDL: 32 mg/dL — ABNORMAL LOW (ref >50)
LDL Cholesterol: 129 mg/dL — ABNORMAL HIGH (ref <100)
Total CHOL/HDL Ratio: 5.7 Ratio — ABNORMAL HIGH (ref <5.0)
Triglycerides: 101 mg/dL (ref <150)
VLDL: 20 mg/dL (ref <30)

## 2016-10-30 ENCOUNTER — Ambulatory Visit: Payer: Managed Care, Other (non HMO) | Admitting: Family Medicine

## 2016-11-18 ENCOUNTER — Encounter: Payer: Self-pay | Admitting: Family Medicine

## 2016-12-10 ENCOUNTER — Encounter: Payer: Self-pay | Admitting: Family Medicine

## 2016-12-10 MED ORDER — OSELTAMIVIR PHOSPHATE 75 MG PO CAPS: 75.0000 mg | ORAL_CAPSULE | Freq: Two times a day (BID) | ORAL | 0 refills | Status: DC

## 2016-12-13 ENCOUNTER — Encounter: Payer: Self-pay | Admitting: *Deleted

## 2016-12-13 ENCOUNTER — Telehealth: Payer: Self-pay | Admitting: *Deleted

## 2016-12-13 NOTE — Telephone Encounter (Signed)
Received VM from patient.   Reports that she and daughter Clement SayresZoe have been out of work and school respectively since Wed, 12/11/2016 for illness. States that they are taking Tamiflu and are improving.   Requested notes excusing absences.   MD please advise.

## 2016-12-13 NOTE — Telephone Encounter (Signed)
Okay to give note for school Andrea [name] Work - Andrea [name]

## 2016-12-13 NOTE — Telephone Encounter (Signed)
Letter printed.   Call placed to patient and patient made aware.

## 2017-02-10 ENCOUNTER — Encounter: Payer: Self-pay | Admitting: Family Medicine

## 2017-02-18 ENCOUNTER — Encounter: Payer: Self-pay | Admitting: Family Medicine

## 2017-02-18 ENCOUNTER — Ambulatory Visit (INDEPENDENT_AMBULATORY_CARE_PROVIDER_SITE_OTHER): Payer: 59 | Admitting: Family Medicine

## 2017-02-18 VITALS — BP 120/72 | HR 74 | Temp 98.6°F | Resp 14 | Ht 61.0 in | Wt 193.0 lb

## 2017-02-18 DIAGNOSIS — E119 Type 2 diabetes mellitus without complications: Secondary | ICD-10-CM

## 2017-02-18 DIAGNOSIS — Z720 Tobacco use: Secondary | ICD-10-CM

## 2017-02-18 DIAGNOSIS — J069 Acute upper respiratory infection, unspecified: Secondary | ICD-10-CM | POA: Diagnosis not present

## 2017-02-18 MED ORDER — AZITHROMYCIN 250 MG PO TABS: ORAL_TABLET | ORAL | 0 refills | Status: DC

## 2017-02-18 MED ORDER — BENZONATATE 100 MG PO CAPS: 100.0000 mg | ORAL_CAPSULE | Freq: Three times a day (TID) | ORAL | 0 refills | Status: DC | PRN

## 2017-02-18 NOTE — Progress Notes (Signed)
   Subjective:    Patient ID: Andrea [name], female    DOB: 07-May-1981, 36 y.o.   MRN: [id number]  Patient presents for Illness (x3 days- productive cough, fever/ chills, malaise )  She here with cough with mild production subjective fever fatigue that started 3 days ago. She recently came [place] from [place] state that there are a lot of people coughing on the airplane. No hemoptysis. She has taken ibuprofen just for mild headache. No sore throat no sinus drainage. She has been able to work the past 2 days without any difficulty. She has taken some over-the-counter cough medicine which has not helped very much. She was there with her family and they are not sick. She stopped the metformin but then recently restarted this. Her last 2 A1c's have been completely normal. She would like a trial off of the medication altogether.  Review Of Systems:  GEN- +fatigue,+ fever, weight loss,weakness, recent illness HEENT- denies eye drainage, change in vision, nasal discharge, CVS- denies chest pain, palpitations RESP- denies SOB,+ cough, wheeze ABD- denies N/V, change in stools, abd pain GU- denies dysuria, hematuria, dribbling, incontinence MSK- denies joint pain, muscle aches, injury Neuro- + headache, dizziness, syncope, seizure activity       Objective:    BP 120/72   Pulse 74   Temp 98.6 F (37 C) (Oral)   Resp 14   Ht 5\' 1"  (1.549 m)   Wt 193 lb (87.5 kg)   SpO2 98%   BMI 36.47 kg/m  GEN- NAD, alert and oriented x3 non toxic appearing  HEENT- PERRL, EOMI, non injected sclera, pink conjunctiva, MMM, oropharynx clear,nares clear rhinorrhea , TM clear bilat no effusion  Neck- Supple, no LAD  CVS- RRR, no murmur RESP-CTAB EXT- No edema Pulses- Radial 2+        Assessment & Plan:      Problem List Items Addressed This Visit    Tobacco abuse - Primary   Diabetes mellitus type 2, uncomplicated (HCC)    Normal A1C past 6 months. Trial off metformin and maintenance with  diet Recheck in 4 months        Other Visit Diagnoses    Acute URI       Viral URI, treat symptoms vitamin C ,tessalon, fluids. Given zpak if she does not improve by end of week. she is smoker. Couneled on cessation   Relevant Medications   azithromycin (ZITHROMAX) 250 MG tablet      Note: This dictation was prepared with Dragon dictation along with smaller phrase technology. Any transcriptional errors that result from this process are unintentional.

## 2017-02-18 NOTE — Assessment & Plan Note (Signed)
Normal A1C past 6 months. Trial off metformin and maintenance with diet Recheck in 4 months

## 2017-02-18 NOTE — Patient Instructions (Addendum)
Vitamin C Tessalon perrles  Start antibiotic if not improved Stop metformin  F/U 4 months

## 2017-04-15 ENCOUNTER — Telehealth: Payer: Self-pay | Admitting: *Deleted

## 2017-04-15 MED ORDER — AMOXICILLIN 500 MG PO CAPS: 500.0000 mg | ORAL_CAPSULE | Freq: Two times a day (BID) | ORAL | 0 refills | Status: DC

## 2017-04-15 MED ORDER — FLUCONAZOLE 150 MG PO TABS: 150.0000 mg | ORAL_TABLET | Freq: Once | ORAL | 0 refills | Status: AC

## 2017-04-15 NOTE — Telephone Encounter (Signed)
Received call from patient.   States that daughter in office with Dx of strep throat. Reports that she has Sx of strep (sore throat, muscle aches).   Requested MD to advise.

## 2017-04-15 NOTE — Telephone Encounter (Signed)
Amoxicillin 500mg  BID x 7 days, ibuprofen for pains/aches

## 2017-04-15 NOTE — Telephone Encounter (Signed)
Call placed to patient and patient made aware.   Prescription sent to pharmacy.  

## 2017-05-16 ENCOUNTER — Ambulatory Visit (INDEPENDENT_AMBULATORY_CARE_PROVIDER_SITE_OTHER): Payer: 59 | Admitting: Family Medicine

## 2017-05-16 VITALS — BP 114/74 | HR 95 | Temp 98.8°F | Resp 18 | Wt 200.0 lb

## 2017-05-16 DIAGNOSIS — Z720 Tobacco use: Secondary | ICD-10-CM | POA: Diagnosis not present

## 2017-05-16 DIAGNOSIS — L821 Other seborrheic keratosis: Secondary | ICD-10-CM | POA: Diagnosis not present

## 2017-05-16 DIAGNOSIS — R053 Chronic cough: Secondary | ICD-10-CM

## 2017-05-16 DIAGNOSIS — G8929 Other chronic pain: Secondary | ICD-10-CM

## 2017-05-16 DIAGNOSIS — F411 Generalized anxiety disorder: Secondary | ICD-10-CM

## 2017-05-16 DIAGNOSIS — R05 Cough: Secondary | ICD-10-CM | POA: Diagnosis not present

## 2017-05-16 DIAGNOSIS — M25571 Pain in right ankle and joints of right foot: Secondary | ICD-10-CM | POA: Diagnosis not present

## 2017-05-16 MED ORDER — ALBUTEROL SULFATE HFA 108 (90 BASE) MCG/ACT IN AERS: 2.0000 | INHALATION_SPRAY | RESPIRATORY_TRACT | 0 refills | Status: DC | PRN

## 2017-05-16 MED ORDER — VENLAFAXINE HCL 37.5 MG PO TABS: 37.5000 mg | ORAL_TABLET | Freq: Two times a day (BID) | ORAL | 3 refills | Status: DC

## 2017-05-16 NOTE — Patient Instructions (Signed)
Pulmonary Function test to be done  Use albuterol inhaler  [facility] [address] Effexor 37.5mg  twice a day for anxiety  F/U 4 weeks

## 2017-05-16 NOTE — Progress Notes (Signed)
Subjective:    Patient ID: Andrea MenghiniLauren A [name], female    DOB: 21-Feb-1981, 36 y.o.   MRN: [id number]  Patient presents for Acute Visit (cough x 3 months)  Treated for URI with supportive care back in May, she did not improve so with smoking treated with ZPAK which she did complete and tessalon perrles.  Had strep throat in early July treated with Amoxicillin Continues to have cough and occaisional episodes of  wheezing worse at night, feels like it is in her throat area. Has tried some cough meds.  She tries to clear throat and it doesn't help. Denies any reflux symptoms  Smokes 1ppd  Day due to increased stress and anxiety back at work and at home with her 2 children  Right ankle pain for past 6 months, does not remember an injury pain on top of foot and ant ankle, often she walks on the lateral edge of her foot to prevent to pain. No meds taken  Has 2 spots on skin, will flake off on forearm, one on face present for many months, no pain no redness, no change in size   Review Of Systems:  GEN- denies fatigue, fever, weight loss,weakness, recent illness HEENT- denies eye drainage, change in vision, nasal discharge, CVS- denies chest pain, palpitations RESP- + SOB, +cough, +wheeze ABD- denies N/V, change in stools, abd pain GU- denies dysuria, hematuria, dribbling, incontinence MSK- denies joint pain, muscle aches, injury Neuro- denies headache, dizziness, syncope, seizure activity       Objective:    BP 114/74 Comment: PF: 1) 250  2) 254  3) 256  Pulse 95   Temp 98.8 F (37.1 C)   Resp 18   Wt 200 lb (90.7 kg)   SpO2 97%   BMI 37.79 kg/m  GEN- NAD, alert and oriented x3 HEENT- PERRL, EOMI, non injected sclera, pink conjunctiva, MMM, oropharynx clear Neck- Supple, no thyromegaly CVS- RRR, no murmur RESP-CTAB ABD-NABS,soft,NT,ND EXT- No edema, MSK- Right ankle, normal appearance, neg squeeze at midfoot, Mild TTP ant ankle between the malleous, fair ROM mild pain with  rotation, pain with plantar flexion of foot  Pulses- Radial, DP- 2+ Psych- normal affect and mood, no SI Skin- benign flesh toned seb keratosis on forearm, face   PFT 250/ 254/ 256      Assessment & Plan:      Problem List Items Addressed This Visit    Tobacco abuse    counsled on cessation Will plan to try Chantix again, once we get her on the effexor, she states anxiety was so high she went back to smoking even with the medication  Concern for COPD , based on smoking history and symptoms Set up for PFT and CXR Given albuterol inhaler to try       Relevant Orders   DG Chest 2 View   Pulmonary function test   GAD (generalized anxiety disorder)    Has tried wellbutrin had SE, lexapro worked but she did not like the decrease in sex drive at the time Smoking is her release, asked about ativan , I recommended against this at this time due to concern she would require many times a day and needs something daily, she also does not want anything habit forming Trial of Effexor, with her obesity, smoking, start with BID doing        Other Visit Diagnoses    Chronic cough    -  Primary   Relevant Orders   DG Chest 2  View   Pulmonary function test   Chronic pain of right ankle       obtain xray, likely more tendinothaphy, possible arthritis as unknown mechanism of injury   Relevant Orders   DG Ankle Complete Right   Seborrheic keratoses       will monitor at this time      Note: This dictation was prepared with Dragon dictation along with smaller phrase technology. Any transcriptional errors that result from this process are unintentional.

## 2017-05-18 ENCOUNTER — Encounter: Payer: Self-pay | Admitting: Family Medicine

## 2017-05-18 NOTE — Assessment & Plan Note (Signed)
Has tried wellbutrin had SE, lexapro worked but she did not like the decrease in sex drive at the time Smoking is her release, asked about ativan , I recommended against this at this time due to concern she would require many times a day and needs something daily, she also does not want anything habit forming Trial of Effexor, with her obesity, smoking, start with BID doing

## 2017-05-18 NOTE — Assessment & Plan Note (Addendum)
counsled on cessation Will plan to try Chantix again, once we get her on the effexor, she states anxiety was so high she went back to smoking even with the medication  Concern for COPD , based on smoking history and symptoms Set up for PFT and CXR Given albuterol inhaler to try

## 2017-05-19 ENCOUNTER — Encounter: Payer: Self-pay | Admitting: *Deleted

## 2017-05-19 MED ORDER — VARENICLINE TARTRATE 0.5 MG X 11 & 1 MG X 42 PO MISC: ORAL | 0 refills | Status: DC

## 2017-05-23 ENCOUNTER — Ambulatory Visit
Admission: RE | Admit: 2017-05-23 | Discharge: 2017-05-23 | Disposition: A | Discharge status: Home / Self Care | Payer: 59 | Source: Ambulatory Visit | Attending: Family Medicine | Admitting: Family Medicine

## 2017-05-23 DIAGNOSIS — Z720 Tobacco use: Secondary | ICD-10-CM

## 2017-05-23 DIAGNOSIS — R05 Cough: Secondary | ICD-10-CM

## 2017-05-23 DIAGNOSIS — R053 Chronic cough: Secondary | ICD-10-CM

## 2017-06-10 ENCOUNTER — Ambulatory Visit (INDEPENDENT_AMBULATORY_CARE_PROVIDER_SITE_OTHER): Payer: 59 | Admitting: Internal Medicine

## 2017-06-10 DIAGNOSIS — R05 Cough: Secondary | ICD-10-CM | POA: Diagnosis not present

## 2017-06-10 DIAGNOSIS — Z720 Tobacco use: Secondary | ICD-10-CM

## 2017-06-10 DIAGNOSIS — R053 Chronic cough: Secondary | ICD-10-CM

## 2017-06-10 LAB — PULMONARY FUNCTION TEST
DL/VA % pred: 110 %
DL/VA: 5.04 ml/min/mmHg/L
DLCO cor % pred: 98 %
DLCO cor: 21.26 ml/min/mmHg
DLCO unc % pred: 97 %
DLCO unc: 21 ml/min/mmHg
FEF 25-75 Post: 2.53 L/sec
FEF 25-75 Pre: 1.86 L/sec
FEF2575-%Change-Post: 35 %
FEF2575-%Pred-Post: 81 %
FEF2575-%Pred-Pre: 60 %
FEV1-%Change-Post: 10 %
FEV1-%Pred-Post: 82 %
FEV1-%Pred-Pre: 75 %
FEV1-Post: 2.37 L
FEV1-Pre: 2.15 L
FEV1FVC-%Change-Post: 3 %
FEV1FVC-%Pred-Pre: 94 %
FEV6-%Change-Post: 6 %
FEV6-%Pred-Post: 84 %
FEV6-%Pred-Pre: 79 %
FEV6-Post: 2.89 L
FEV6-Pre: 2.73 L
FEV6FVC-%Change-Post: 0 %
FEV6FVC-%Pred-Post: 101 %
FEV6FVC-%Pred-Pre: 102 %
FVC-%Change-Post: 6 %
FVC-%Pred-Post: 83 %
FVC-%Pred-Pre: 78 %
FVC-Post: 2.91 L
FVC-Pre: 2.73 L
Post FEV1/FVC ratio: 81 %
Post FEV6/FVC ratio: 99 %
Pre FEV1/FVC ratio: 79 %
Pre FEV6/FVC Ratio: 100 %
RV % pred: 126 %
RV: 1.84 L
TLC % pred: 96 %
TLC: 4.57 L

## 2017-06-10 NOTE — Progress Notes (Signed)
PFT complete today 06/10/17.

## 2017-06-13 ENCOUNTER — Other Ambulatory Visit: Payer: Self-pay | Admitting: *Deleted

## 2017-06-13 ENCOUNTER — Encounter: Payer: Self-pay | Admitting: *Deleted

## 2017-06-13 MED ORDER — TIOTROPIUM BROMIDE MONOHYDRATE 1.25 MCG/ACT IN AERS: 2.0000 | INHALATION_SPRAY | Freq: Every day | RESPIRATORY_TRACT | 11 refills | Status: DC

## 2017-06-18 ENCOUNTER — Encounter: Payer: Self-pay | Admitting: Family Medicine

## 2017-06-18 ENCOUNTER — Ambulatory Visit (INDEPENDENT_AMBULATORY_CARE_PROVIDER_SITE_OTHER): Payer: 59 | Admitting: Family Medicine

## 2017-06-18 VITALS — BP 110/68 | HR 98 | Temp 98.6°F | Resp 14 | Ht 61.0 in | Wt 196.8 lb

## 2017-06-18 DIAGNOSIS — F411 Generalized anxiety disorder: Secondary | ICD-10-CM

## 2017-06-18 DIAGNOSIS — J449 Chronic obstructive pulmonary disease, unspecified: Secondary | ICD-10-CM | POA: Diagnosis not present

## 2017-06-18 DIAGNOSIS — Z720 Tobacco use: Secondary | ICD-10-CM

## 2017-06-18 DIAGNOSIS — G5600 Carpal tunnel syndrome, unspecified upper limb: Secondary | ICD-10-CM | POA: Insufficient documentation

## 2017-06-18 DIAGNOSIS — G4733 Obstructive sleep apnea (adult) (pediatric): Secondary | ICD-10-CM | POA: Diagnosis not present

## 2017-06-18 DIAGNOSIS — G5603 Carpal tunnel syndrome, bilateral upper limbs: Secondary | ICD-10-CM

## 2017-06-18 MED ORDER — VENLAFAXINE HCL 37.5 MG PO TABS: 37.5000 mg | ORAL_TABLET | Freq: Two times a day (BID) | ORAL | 3 refills | Status: DC

## 2017-06-18 MED ORDER — ALPRAZOLAM 0.5 MG PO TABS: 0.5000 mg | ORAL_TABLET | Freq: Two times a day (BID) | ORAL | 1 refills | Status: DC | PRN

## 2017-06-18 NOTE — Assessment & Plan Note (Addendum)
Given samples of spiriva respimat  A coupon card so she can activate. Also given coupon card for her Chantix and discussed the importance of tobacco cessation and weight loss to help with her COPD. The her COPD is mild and therefore she can reverse  her symptoms. Her chest x-ray does not show any chronic damage or emphysematous changes

## 2017-06-18 NOTE — Patient Instructions (Addendum)
Try the spiriva Use xanax as needed Start the effexor  F/U 2 months Physical

## 2017-06-18 NOTE — Assessment & Plan Note (Signed)
Discussed the medications how they work. She has significant stressors in about one her addicted to benzodiazepines multiple times a day for her symptoms.. Which his wife think that a long-acting will work better for her. She is willing to try this she's been to go ahead and get the venlafaxine intake twice a day. I did give her short-term of Xanax to use for sleep at night as this gets in her system as a bridge.

## 2017-06-18 NOTE — Progress Notes (Signed)
Subjective:    Patient ID: Andrea [name], female    DOB: 03/03/1981, 36 y.o.   MRN: [id number]  Patient presents for COPD testing follow up  She here to follow-up interim visit. She was diagnosed with mild COPD prescribed Spiriva but she was unable to afford. She thinks that she'll be get the medication within a week. She does continue to smoke but she was unable to afford the Chantix again waiting until she has the finances to do this. Wed mail her some keep on cards abuse for insurance but she did not receive these. She denies any cough or shortness of breath in the past couple weeks. She did tell significant difference when she did use the albuterol during her pulmonary function tests.  Anxiety she's had significant anxiety and stress. She looked the side effect was concerned she would have libido problems when she discussed with her husband she picked up the prescription for Effexor but then threw it away. 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 She is willing to actually try the medication. She also asked about something temporarily to help her sleep.  She's history of obstructive sleep apnea she needs a new nasal mask as well as tubing    At the end of the visit she complained of some pain in both hands states she does get tingling and numbness in her fingertips. 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 She noticed when she breaks her daughter's hair that she has to shake her hands out.  Review Of Systems:  GEN- denies fatigue, fever, weight loss,weakness, recent illness HEENT- denies eye drainage, change in vision, nasal discharge, CVS- denies chest pain, palpitations RESP- denies SOB, cough, wheeze ABD- denies N/V, change in stools, abd pain GU- denies dysuria, hematuria, dribbling, incontinence MSK- + joint pain, muscle aches, injury Neuro-  denies headache, dizziness, syncope, seizure activity       Objective:    BP 110/68   Pulse 98   Temp 98.6 F (37 C) (Oral)   Resp 14   Ht 5\' 1"  (1.549 m)   Wt 196 lb 12.8 oz (89.3 kg)   LMP 06/11/2017 (Approximate)   SpO2 99%   BMI 37.19 kg/m  GEN- NAD, alert and oriented x3 CVS- RRR, no murmur RESP-CTAB MSK- bilat hands, normal appearance, no effusion, able to make fist, normal grasp  Neuro- + phalens, tinels R >l , sensation grossly in tact Psych- stressed appearing, not depressed appearing, well groomed, normal speech, no SI  Pulses- Radial, 2+        Assessment & Plan:      Problem List Items Addressed This Visit      Unprioritized   Tobacco abuse   Obstructive sleep apnea    Nasal mask and tubing prescription sent      GAD (generalized anxiety disorder) - Primary    Discussed the medications how they work. She has significant stressors in about one her addicted to benzodiazepines multiple times a day for her symptoms.. Which his wife think that a long-acting will work better for her. She is willing to try this she's been to go ahead and get the venlafaxine intake twice a day. I did give her short-term of Xanax to use for sleep at night as this gets in her system as a bridge.  COPD (chronic obstructive pulmonary disease) (HCC)    Given samples of spiriva respimat  A coupon card so she can activate. Also given coupon card for her Chantix and discussed the importance of tobacco cessation and weight loss to help with her COPD. The her COPD is mild and therefore she can reverse  her symptoms. Her chest x-ray does not show any chronic damage or emphysematous changes      Carpal tunnel syndrome    Carpal tunnel seems to be worse in her right hand we discussed getting nerve conduction study would be the goal standard for diagnosis. She'll also hold off at this time. We discussed bracing but she did not seem too keen on that. We discussed changes that she can make  with her keyboard using a headset instead of the telephone      Relevant Medications   venlafaxine (EFFEXOR) 37.5 MG tablet   ALPRAZolam (XANAX) 0.5 MG tablet      Note: This dictation was prepared with Dragon dictation along with smaller phrase technology. Any transcriptional errors that result from this process are unintentional.

## 2017-06-18 NOTE — Assessment & Plan Note (Signed)
Carpal tunnel seems to be worse in her right hand we discussed getting nerve conduction study would be the goal standard for diagnosis. She'll also hold off at this time. We discussed bracing but she did not seem too keen on that. We discussed changes that she can make with her keyboard using a headset instead of the telephone

## 2017-06-18 NOTE — Assessment & Plan Note (Signed)
Nasal mask and tubing prescription sent

## 2017-07-18 ENCOUNTER — Ambulatory Visit (INDEPENDENT_AMBULATORY_CARE_PROVIDER_SITE_OTHER): Payer: 59 | Admitting: *Deleted

## 2017-07-18 DIAGNOSIS — Z23 Encounter for immunization: Secondary | ICD-10-CM

## 2017-07-21 ENCOUNTER — Encounter: Payer: Self-pay | Admitting: Family Medicine

## 2017-07-21 DIAGNOSIS — Z124 Encounter for screening for malignant neoplasm of cervix: Secondary | ICD-10-CM | POA: Diagnosis not present

## 2017-07-21 DIAGNOSIS — Z01419 Encounter for gynecological examination (general) (routine) without abnormal findings: Secondary | ICD-10-CM | POA: Diagnosis not present

## 2017-07-22 DIAGNOSIS — Z124 Encounter for screening for malignant neoplasm of cervix: Secondary | ICD-10-CM | POA: Diagnosis not present

## 2017-08-12 DIAGNOSIS — G4733 Obstructive sleep apnea (adult) (pediatric): Secondary | ICD-10-CM | POA: Diagnosis not present

## 2017-08-15 ENCOUNTER — Emergency Department (HOSPITAL_COMMUNITY): Payer: 59

## 2017-08-15 ENCOUNTER — Encounter (HOSPITAL_COMMUNITY): Payer: Self-pay

## 2017-08-15 DIAGNOSIS — R0789 Other chest pain: Secondary | ICD-10-CM | POA: Diagnosis not present

## 2017-08-15 DIAGNOSIS — R9431 Abnormal electrocardiogram [ECG] [EKG]: Secondary | ICD-10-CM | POA: Diagnosis not present

## 2017-08-15 DIAGNOSIS — R079 Chest pain, unspecified: Secondary | ICD-10-CM | POA: Insufficient documentation

## 2017-08-15 DIAGNOSIS — Z5321 Procedure and treatment not carried out due to patient leaving prior to being seen by health care provider: Secondary | ICD-10-CM | POA: Insufficient documentation

## 2017-08-15 DIAGNOSIS — R109 Unspecified abdominal pain: Secondary | ICD-10-CM | POA: Diagnosis not present

## 2017-08-15 LAB — BASIC METABOLIC PANEL
Anion gap: 10 (ref 5–15)
BUN: 10 mg/dL (ref 6–20)
CO2: 23 mmol/L (ref 22–32)
Calcium: 8.8 mg/dL — ABNORMAL LOW (ref 8.9–10.3)
Chloride: 101 mmol/L (ref 101–111)
Creatinine, Ser: 0.75 mg/dL (ref 0.44–1.00)
GFR calc Af Amer: >60 mL/min (ref >60)
GFR calc non Af Amer: >60 mL/min (ref >60)
Glucose, Bld: 307 mg/dL — ABNORMAL HIGH (ref 65–99)
Potassium: 3.6 mmol/L (ref 3.5–5.1)
Sodium: 134 mmol/L — ABNORMAL LOW (ref 135–145)

## 2017-08-15 LAB — CBC
HCT: 43.2 % (ref 36.0–46.0)
Hemoglobin: 14.8 g/dL (ref 12.0–15.0)
MCH: 30.8 pg (ref 26.0–34.0)
MCHC: 34.3 g/dL (ref 30.0–36.0)
MCV: 89.8 fL (ref 78.0–100.0)
Platelets: 247 10*3/uL (ref 150–400)
RBC: 4.81 MIL/uL (ref 3.87–5.11)
RDW: 12.4 % (ref 11.5–15.5)
WBC: 12.2 10*3/uL — ABNORMAL HIGH (ref 4.0–10.5)

## 2017-08-15 LAB — TROPONIN I: Troponin I: <0.03 ng/mL (ref <0.03)

## 2017-08-15 LAB — I-STAT BETA HCG BLOOD, ED (MC, WL, AP ONLY): I-stat hCG, quantitative: <5 m[IU]/mL (ref <5)

## 2017-08-15 NOTE — ED Triage Notes (Signed)
Pt endorses right flank pain for several day and today began having left sided chest pain with radiation down the left shoulder and arm. Tachy in triage at 120.

## 2017-08-16 ENCOUNTER — Emergency Department (HOSPITAL_COMMUNITY)
Admission: EM | Admit: 2017-08-16 | Discharge: 2017-08-16 | Disposition: A | Discharge status: Home / Self Care | Payer: 59 | Attending: Emergency Medicine | Admitting: Emergency Medicine

## 2017-08-16 HISTORY — DX: Essential (primary) hypertension: I10

## 2017-08-16 NOTE — ED Notes (Signed)
Pt decided she could not wait any longer. She decided to leave.

## 2017-08-19 ENCOUNTER — Telehealth: Payer: Self-pay | Admitting: *Deleted

## 2017-08-19 NOTE — Telephone Encounter (Signed)
CPAP supply order signed by Dr. Kelly and returned to [facility]. 

## 2017-08-20 ENCOUNTER — Ambulatory Visit: Payer: 59 | Admitting: Family Medicine

## 2017-08-20 ENCOUNTER — Encounter: Payer: Self-pay | Admitting: Family Medicine

## 2017-08-20 VITALS — BP 116/66 | HR 110 | Temp 99.1°F | Resp 14 | Ht 61.0 in | Wt 198.0 lb

## 2017-08-20 DIAGNOSIS — R000 Tachycardia, unspecified: Secondary | ICD-10-CM | POA: Diagnosis not present

## 2017-08-20 DIAGNOSIS — E119 Type 2 diabetes mellitus without complications: Secondary | ICD-10-CM

## 2017-08-20 DIAGNOSIS — R079 Chest pain, unspecified: Secondary | ICD-10-CM | POA: Diagnosis not present

## 2017-08-20 NOTE — Assessment & Plan Note (Signed)
Concerned that her random glucose was greater than 300 she is not on any medications for diabetes her last A1c was normal.  I went to recheck this today.  As well as a metabolic panel.  Concerned about her chest pain although it is resolved she has multiple risk factors for coronary artery disease including family history she is a smoker she now has lung pathology she has diabetes she is obese.  We have set her back up with her cardiologist she was last seen about 4 years ago.  She also has sinus tachycardia which she has noted that her heart rate has been elevated over the past few months as well.  Her blood pressure however is on the borderline low side.  I did give her samples of Bystolic 5 mg if she does notice that her heart rate is getting back up into the 120s she is to take one.  We will get her an appointment with cardiology

## 2017-08-20 NOTE — Patient Instructions (Signed)
Bystolic 5mg  once a day

## 2017-08-20 NOTE — Progress Notes (Signed)
Subjective:    Patient ID: Andrea [name], female    DOB: [birth year], 36 y.o.   MRN: [id number]  Patient presents for ER F/U (left ER AMA after chest pain- states that EKG/ X-RAY/ labs were done, but she waited 5 hours before leaving) Patient went to the ER on November 3 secondary to flank pain and chest pain radiating to her arm however did not want to wait and went home without being seen. She had CXR done which showed Mild bronchitic changes, no PNA  Troponin was negative CBC unremarkable metabolic panel showed a glucose of 300 EKG showed sinus tachycardia  Her symptoms started with intermittent abdominal pain for a week.  But then when she had a bowel movement and relieved gas is improved.  Also Friday night she began having chest pain on the left side when she would move her arm she would have discomfort.  That resolved after 24 hours.  Date she went to her brother's wedding the next day did not have any difficulties.  And before she had actually been to the ER she was out with friends she had alcohol in [place]   Review Of Systems:  GEN- denies fatigue, fever, weight loss,weakness, recent illness HEENT- denies eye drainage, change in vision, nasal discharge, CVS- +chest pain, palpitations RESP- denies SOB, cough, wheeze ABD- denies N/V, change in stools, abd pain GU- denies dysuria, hematuria, dribbling, incontinence MSK- denies joint pain, muscle aches, injury Neuro- denies headache, dizziness, syncope, seizure activity       Objective:    BP 116/66   Pulse (!) 110   Temp 99.1 F (37.3 C) (Oral)   Resp 14   Ht 5\' 1"  (1.549 m)   Wt 198 lb (89.8 kg)   LMP 08/08/2017 (Exact Date)   SpO2 96%   BMI 37.41 kg/m  GEN- NAD, alert and oriented x3 HEENT- PERRL, EOMI, non injected sclera, pink conjunctiva, MMM, oropharynx clear Neck- Supple, no thyromegaly CVS- mild tachycardia, no murmur Chest wall Non tender RESP-CTAB ABD-NABS,soft,NT,ND EXT- No edema Pulses-  Radial, DP- 2+    EKG- Sinus tachycardia HR  110     Assessment & Plan:      Problem List Items Addressed This Visit      Unprioritized   Diabetes mellitus type 2, uncomplicated (HCC)    Concerned that her random glucose was greater than 300 she is not on any medications for diabetes her last A1c was normal.  I went to recheck this today.  As well as a metabolic panel.  Concerned about her chest pain although it is resolved she has multiple risk factors for coronary artery disease including family history she is a smoker she now has lung pathology she has diabetes she is obese.  We have set her back up with her cardiologist she was last seen about 4 years ago.  She also has sinus tachycardia which she has noted that her heart rate has been elevated over the past few months as well.  Her blood pressure however is on the borderline low side.  I did give her samples of Bystolic 5 mg if she does notice that her heart rate is getting back up into the 120s she is to take one.  We will get her an appointment with cardiology      Relevant Orders   Basic metabolic panel   Hemoglobin A1c    Other Visit Diagnoses    Chest pain, unspecified type    -  Primary   Relevant Orders   EKG 12-Lead (Completed)   Sinus tachycardia          Note: This dictation was prepared with Dragon dictation along with smaller phrase technology. Any transcriptional errors that result from this process are unintentional.

## 2017-08-21 ENCOUNTER — Other Ambulatory Visit: Payer: Self-pay | Admitting: *Deleted

## 2017-08-21 LAB — HEMOGLOBIN A1C
Hgb A1c MFr Bld: 7.2 % of total Hgb — ABNORMAL HIGH (ref <5.7)
Mean Plasma Glucose: 160 (calc)
eAG (mmol/L): 8.9 (calc)

## 2017-08-21 LAB — BASIC METABOLIC PANEL
BUN: 15 mg/dL (ref 7–25)
CO2: 27 mmol/L (ref 20–32)
Calcium: 9.6 mg/dL (ref 8.6–10.2)
Chloride: 103 mmol/L (ref 98–110)
Creat: 0.66 mg/dL (ref 0.50–1.10)
Glucose, Bld: 139 mg/dL — ABNORMAL HIGH (ref 65–99)
Potassium: 4.1 mmol/L (ref 3.5–5.3)
Sodium: 138 mmol/L (ref 135–146)

## 2017-08-21 MED ORDER — BLOOD GLUCOSE SYSTEM PAK KIT: PACK | 1 refills | Status: DC

## 2017-08-21 MED ORDER — LANCET DEVICES MISC: 1 refills | Status: DC

## 2017-08-21 MED ORDER — BLOOD GLUCOSE TEST VI STRP: ORAL_STRIP | 1 refills | Status: DC

## 2017-08-21 MED ORDER — LANCETS MISC: 1 refills | Status: DC

## 2017-08-21 MED ORDER — METFORMIN HCL 500 MG PO TABS: 500.0000 mg | ORAL_TABLET | Freq: Every day | ORAL | 3 refills | Status: DC

## 2017-08-22 ENCOUNTER — Ambulatory Visit: Payer: 59 | Admitting: Family Medicine

## 2017-08-27 ENCOUNTER — Encounter: Payer: 59 | Admitting: Family Medicine

## 2017-11-19 DIAGNOSIS — N762 Acute vulvitis: Secondary | ICD-10-CM | POA: Diagnosis not present

## 2017-12-02 ENCOUNTER — Encounter: Payer: Self-pay | Admitting: Family Medicine

## 2017-12-03 ENCOUNTER — Ambulatory Visit: Payer: 59 | Admitting: Family Medicine

## 2017-12-03 ENCOUNTER — Encounter: Payer: Self-pay | Admitting: Family Medicine

## 2017-12-03 ENCOUNTER — Other Ambulatory Visit: Payer: Self-pay

## 2017-12-03 VITALS — BP 120/62 | HR 98 | Temp 99.4°F | Resp 16 | Ht 61.0 in | Wt 196.2 lb

## 2017-12-03 DIAGNOSIS — R6889 Other general symptoms and signs: Secondary | ICD-10-CM | POA: Diagnosis not present

## 2017-12-03 DIAGNOSIS — J101 Influenza due to other identified influenza virus with other respiratory manifestations: Secondary | ICD-10-CM | POA: Diagnosis not present

## 2017-12-03 LAB — INFLUENZA A AND B AG, IMMUNOASSAY
INFLUENZA A ANTIGEN: NOT DETECTED
INFLUENZA B ANTIGEN: NOT DETECTED

## 2017-12-03 MED ORDER — OSELTAMIVIR PHOSPHATE 75 MG PO CAPS
75.0000 mg | ORAL_CAPSULE | Freq: Two times a day (BID) | ORAL | 0 refills | Status: DC
Start: 2017-12-03 — End: 2018-01-14

## 2017-12-03 NOTE — Progress Notes (Signed)
   Subjective:    Patient ID: Andrea [name], female    DOB: [birth year], 37 y.o.   MRN: [id number]  Patient presents for Illness (x2 days- fever, cough, malaise)   Stared yesterday with subjective fever, fatigue, body aches, nauea, + diarrhea. No vomiting  Took ibuprofen yesterday.   Cough without production  Decreased appetite    Review Of Systems:  GEN- + fatigue, fever, weight loss,weakness, recent illness HEENT- denies eye drainage, change in vision, nasal discharge, CVS- denies chest pain, palpitations RESP- denies SOB,+ cough, wheeze ABD- denies N/V, change in stools, abd pain GU- denies dysuria, hematuria, dribbling, incontinence MSK- denies joint pain, +muscle aches, injury Neuro-+ headache, dizziness, syncope, seizure activity       Objective:    BP 120/62   Pulse 98   Temp 99.4 F (37.4 C) (Oral)   Resp 16   Ht 5\' 1"  (1.549 m)   Wt 196 lb 3.2 oz (89 kg)   SpO2 98%   BMI 37.07 kg/m  GEN- NAD, alert and oriented x3, sick appearing  HEENT- PERRL, EOMI, non injected sclera, pink conjunctiva, MMM, oropharynx clear TM clear bilat no effusion,  No  maxillary sinus tenderness, nares clear Neck- Supple, shotty ant  LAD CVS- RRR, no murmur RESP-CTAB EXT- No edema Pulses- Radial 2+          Assessment & Plan:      Problem List Items Addressed This Visit    None    Visit Diagnoses    Influenza A    -  Primary   Daughter flu positive today, she has same symptoms treat for clinical flu, tamiflu, fluids, fever reducer, out of work   Relevant Medications   oseltamivir (TAMIFLU) 75 MG capsule   Other Relevant Orders   Influenza A and B Ag, Immunoassay (Completed)      Note: This dictation was prepared with Dragon dictation along with smaller phrase technology. Any transcriptional errors that result from this process are unintentional.

## 2017-12-03 NOTE — Patient Instructions (Addendum)
Flu medication called in F/U as needed   Influenza, Adult Influenza ("the flu") is an infection in the lungs, nose, and throat (respiratory tract). It is caused by a virus. The flu causes many common cold symptoms, as well as a high fever and body aches. It can make you feel very sick. The flu spreads easily from person to person (is contagious). Getting a flu shot (influenza vaccination) every year is the best way to prevent the flu. Follow these instructions at home:  Take over-the-counter and prescription medicines only as told by your doctor.  Use a cool mist humidifier to add moisture (humidity) to the air in your home. This can make it easier to breathe.  Rest as needed.  Drink enough fluid to keep your pee (urine) clear or pale yellow.  Cover your mouth and nose when you cough or sneeze.  Wash your hands with soap and water often, especially after you cough or sneeze. If you cannot use soap and water, use hand sanitizer.  Stay home from work or school as told by your doctor. Unless you are visiting your doctor, try to avoid leaving home until your fever has been gone for 24 hours without the use of medicine.  Keep all follow-up visits as told by your doctor. This is important. How is this prevented?  Getting a yearly (annual) flu shot is the best way to avoid getting the flu. You may get the flu shot in late summer, fall, or winter. Ask your doctor when you should get your flu shot.  Wash your hands often or use hand sanitizer often.  Avoid contact with people who are sick during cold and flu season.  Eat healthy foods.  Drink plenty of fluids.  Get enough sleep.  Exercise regularly. Contact a doctor if:  You get new symptoms.  You have: ? Chest pain. ? Watery poop (diarrhea). ? A fever.  Your cough gets worse.  You start to have more mucus.  You feel sick to your stomach (nauseous).  You throw up (vomit). Get help right away if:  You start to be short  of breath or have trouble breathing.  Your skin or nails turn a bluish color.  You have very bad pain or stiffness in your neck.  You get a sudden headache.  You get sudden pain in your face or ear.  You cannot stop throwing up. This information is not intended to replace advice given to you by your health care provider. Make sure you discuss any questions you have with your health care provider. Document Released: 07/09/2008 Document Revised: 03/07/2016 Document Reviewed: 07/25/2015 Elsevier Interactive Patient Education  2017 [employer]

## 2017-12-08 ENCOUNTER — Telehealth: Payer: Self-pay | Admitting: Family Medicine

## 2017-12-08 MED ORDER — ONDANSETRON 4 MG PO TBDP: 4.0000 mg | ORAL_TABLET | Freq: Four times a day (QID) | ORAL | 0 refills | Status: DC | PRN

## 2017-12-08 NOTE — Telephone Encounter (Signed)
Call placed to patient. LMTRC.  

## 2017-12-08 NOTE — Addendum Note (Signed)
Addended by: Phillips OdorSIX, CHRISTINA H on: 12/08/2017 02:48 PM   Modules accepted: Orders

## 2017-12-08 NOTE — Telephone Encounter (Signed)
Received return call from patient.   Reports that she was feeling improved from Influenza A until last night. Reports that she has nausea, abdominal pain, loss of appetite, fever/ chills, and malaise. Reports that productive cough continues.   Advised that cough may last >2 weeks. Advised to increase fluids, especially Pedialyte or Gatorade, bland diet and may advance as tolerated. Advised to remain out of school/ work until at least 24hrs after last fever without fever reducer. Advised if Sx worsen or persists >3days to contact office.   Will give work/ school note after fever resolved.

## 2017-12-08 NOTE — Telephone Encounter (Signed)
Pt still is not feeling well, she is having nausea, sweating, still having fevers, coughing. Wants to know if we can call in something to [facility] [address]. Wants updated work note because she cannot return today.

## 2017-12-08 NOTE — Telephone Encounter (Signed)
Okay to send Zofran 4mg  ODT every 6 hours if needed Schedule for Wed if not improved

## 2017-12-08 NOTE — Telephone Encounter (Signed)
Patient returned call and made aware.   Prescription sent to pharmacy.  

## 2017-12-10 ENCOUNTER — Encounter: Payer: Self-pay | Admitting: Family Medicine

## 2017-12-10 ENCOUNTER — Encounter: Payer: Self-pay | Admitting: *Deleted

## 2018-01-04 ENCOUNTER — Encounter: Payer: Self-pay | Admitting: Family Medicine

## 2018-01-14 ENCOUNTER — Ambulatory Visit (INDEPENDENT_AMBULATORY_CARE_PROVIDER_SITE_OTHER): Payer: BLUE CROSS/BLUE SHIELD | Admitting: Family Medicine

## 2018-01-14 ENCOUNTER — Encounter: Payer: Self-pay | Admitting: Family Medicine

## 2018-01-14 ENCOUNTER — Other Ambulatory Visit: Payer: Self-pay

## 2018-01-14 VITALS — BP 112/68 | HR 112 | Temp 99.1°F | Resp 14 | Ht 61.0 in | Wt 196.0 lb

## 2018-01-14 DIAGNOSIS — J069 Acute upper respiratory infection, unspecified: Secondary | ICD-10-CM

## 2018-01-14 LAB — INFLUENZA A AND B AG, IMMUNOASSAY
INFLUENZA A ANTIGEN: NOT DETECTED
INFLUENZA B ANTIGEN: NOT DETECTED

## 2018-01-14 NOTE — Progress Notes (Signed)
   Subjective:    Patient ID: Andrea [name], female    DOB: 1981-05-25, 37 y.o.   MRN: [id number]  Patient presents for Illness (x3 days- fatigue, body aches, nausea, feverish- positive exposure x3)   Pt here with body aches, nausea,cough started 3 days ago, had flu back in Feb, treated with tamiflu, positive exposure to flu again , subjective fever on Monday with sweats.  Found old tamiflu took 2 yesterday, and 1 this morning, took theraflu, emergency C       Review Of Systems:  GEN- denies fatigue, fever, weight loss,weakness, recent illness HEENT- denies eye drainage, change in vision, nasal discharge, CVS- denies chest pain, palpitations RESP- denies SOB, cough, wheeze ABD- denies N/V, change in stools, abd pain GU- denies dysuria, hematuria, dribbling, incontinence MSK- denies joint pain, muscle aches, injury Neuro- denies headache, dizziness, syncope, seizure activity       Objective:    BP 112/68   Pulse (!) 112   Temp 99.1 F (37.3 C) (Oral)   Resp 14   Ht 5\' 1"  (1.549 m)   Wt 196 lb (88.9 kg)   SpO2 98%   BMI 37.03 kg/m  GEN- NAD, alert and oriented x3 HEENT- PERRL, EOMI, non injected sclera, pink conjunctiva, MMM, oropharynx clear, TM clear bilat, nares clear rhinorrhea Neck- Supple,few shotty nodes  CVS- mild Tachycardia, no murmur,cap refill , 3 sec  RESP-CTAB ABD-NABS,soft,NT,ND EXT- No edema Pulses- Radial 2+        Assessment & Plan:      Problem List Items Addressed This Visit    None    Visit Diagnoses    Viral URI    -  Primary   Treat symptoms, fluids, rest, tessalon perrles. Flu neg    Relevant Orders   Influenza A and B Ag, Immunoassay      Note: This dictation was prepared with Dragon dictation along with smaller phrase technology. Any transcriptional errors that result from this process are unintentional.

## 2018-01-14 NOTE — Patient Instructions (Addendum)
Take cough medicine Fluids, rest  Give note for work, out 4/2 and 4/3 return tomorrow 4/4 F/U July

## 2018-02-06 ENCOUNTER — Telehealth: Payer: Self-pay | Admitting: *Deleted

## 2018-02-06 MED ORDER — AMOXICILLIN 500 MG PO CAPS: 500.0000 mg | ORAL_CAPSULE | Freq: Two times a day (BID) | ORAL | 0 refills | Status: AC

## 2018-02-06 NOTE — Telephone Encounter (Signed)
Patient daughter seen in office with + strep.   NO given by MD for Amoxicillin 500mg PO BID x7 days.   Prescription sent to pharmacy.   

## 2018-02-11 IMAGING — DX DG CHEST 2V
2 series · 2 of 2 positions shown · non-contrast
Comparison: Chest radiograph May 23, 2017

CLINICAL DATA: RIGHT abdominal pain for a few days. Central chest
pain for 1 day. History of COPD.

EXAM:
CHEST  2 VIEW

[chest pa]
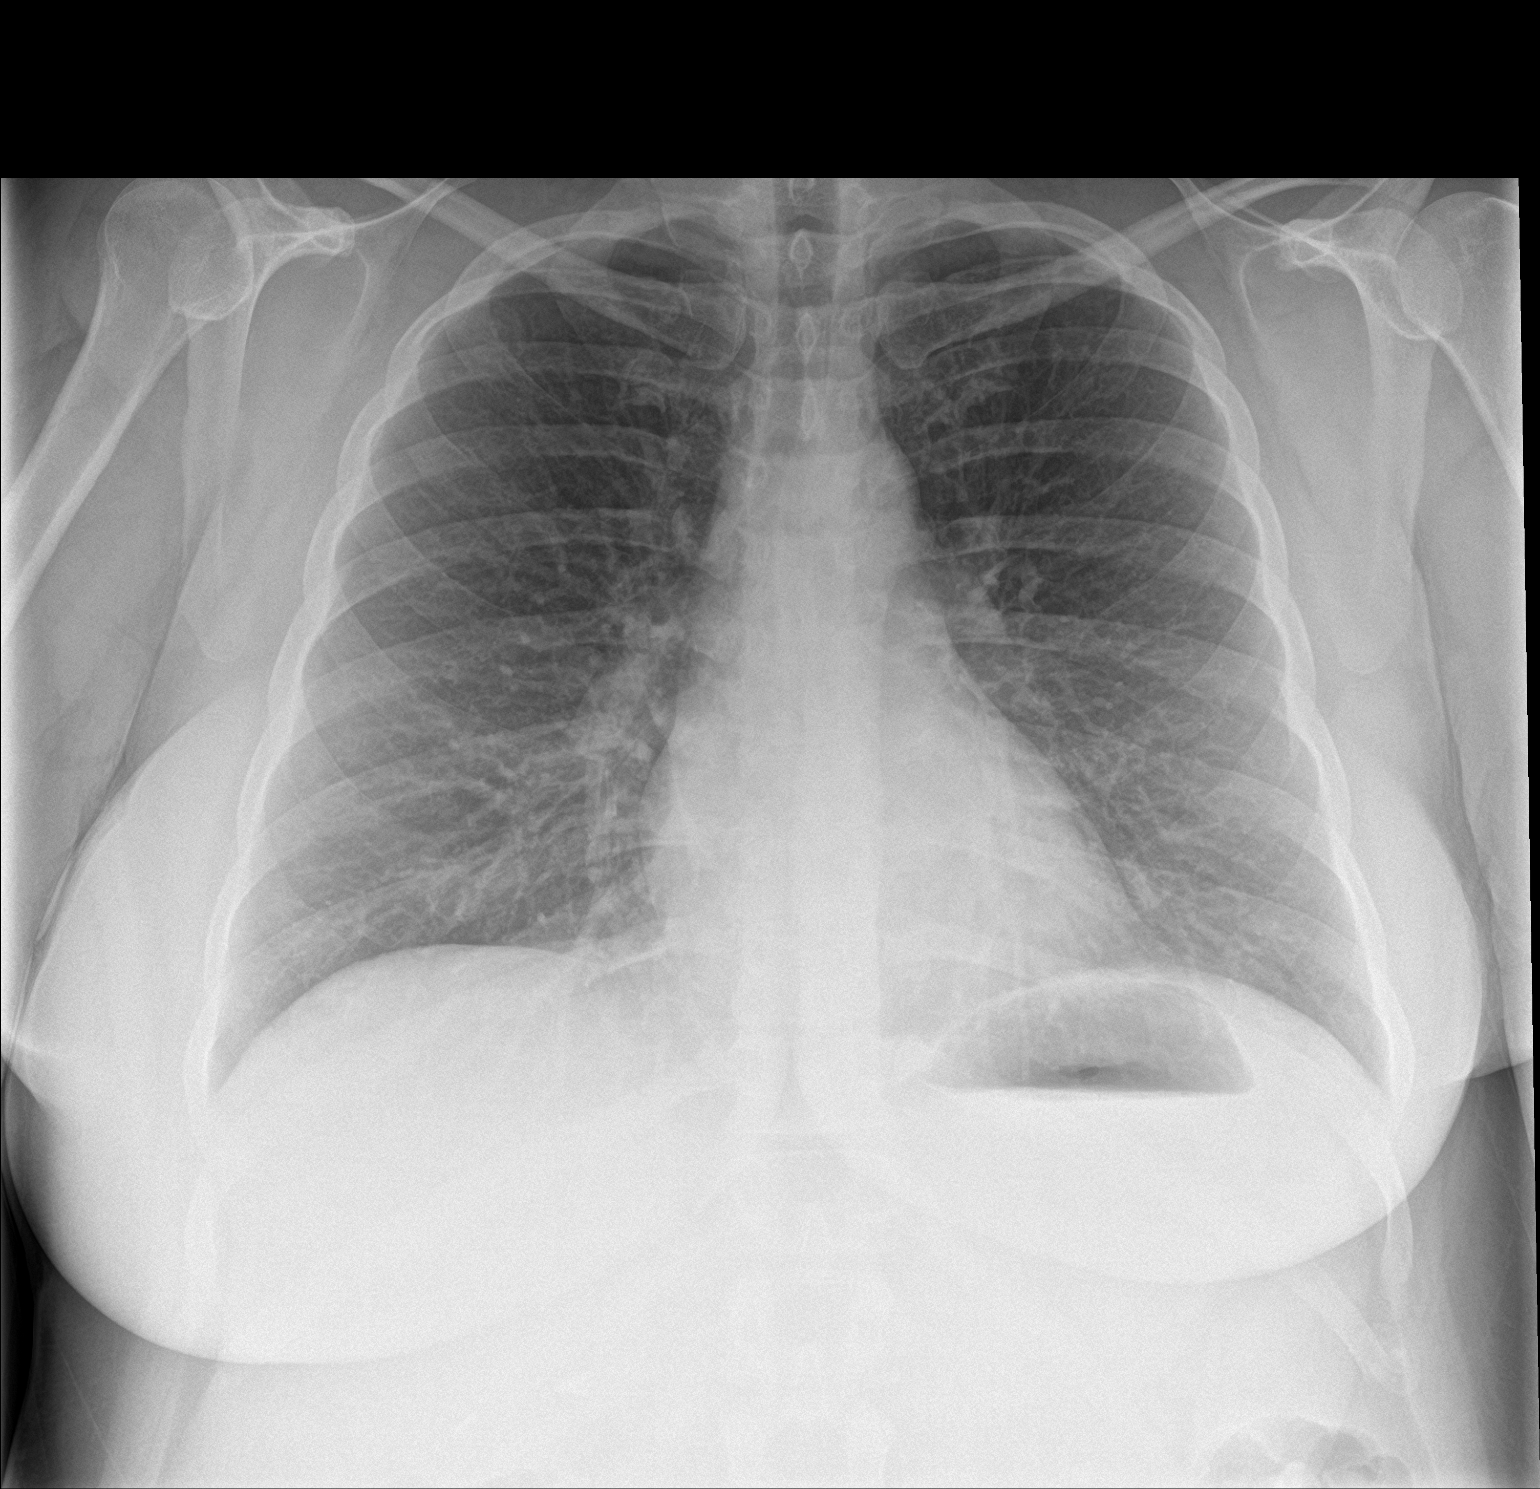

[chest lat]
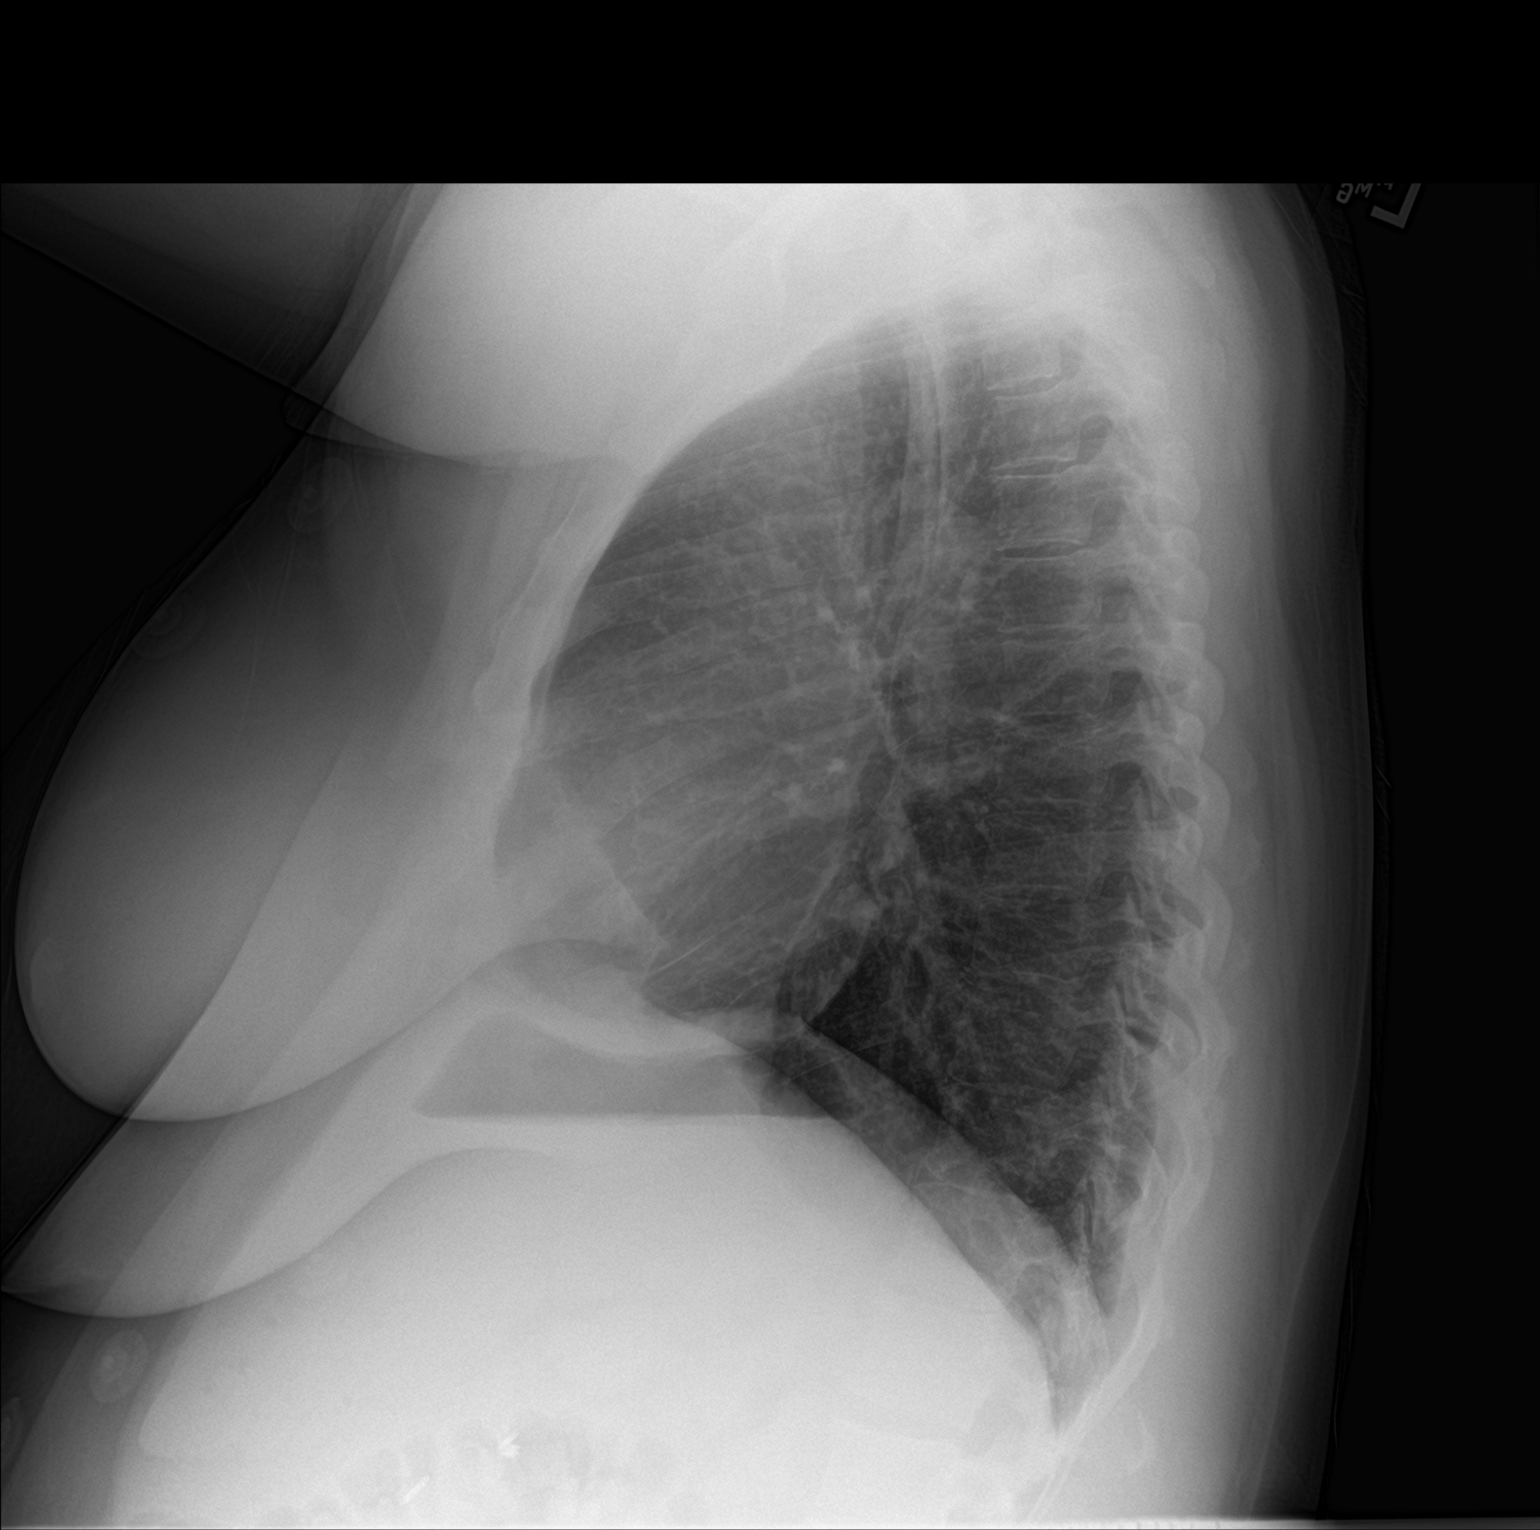

[2 of 2 positions shown; findings below may reference images not displayed]

FINDINGS: Cardiomediastinal silhouette is normal. No pleural effusions or
focal consolidations. Mild bronchitic changes. Trachea projects
midline and there is no pneumothorax. Soft tissue planes and
included osseous structures are non-suspicious. Surgical clips in
the included right abdomen compatible with cholecystectomy.
IMPRESSION: Mild bronchitic changes without focal consolidation.

## 2018-03-02 ENCOUNTER — Encounter: Payer: Self-pay | Admitting: Family Medicine

## 2018-03-02 MED ORDER — BENZONATATE 200 MG PO CAPS: 200.0000 mg | ORAL_CAPSULE | Freq: Two times a day (BID) | ORAL | 0 refills | Status: DC | PRN

## 2018-03-06 ENCOUNTER — Ambulatory Visit (INDEPENDENT_AMBULATORY_CARE_PROVIDER_SITE_OTHER): Payer: BLUE CROSS/BLUE SHIELD | Admitting: Family Medicine

## 2018-03-06 ENCOUNTER — Encounter: Payer: Self-pay | Admitting: Family Medicine

## 2018-03-06 ENCOUNTER — Other Ambulatory Visit: Payer: Self-pay

## 2018-03-06 VITALS — BP 116/82 | HR 100 | Temp 98.6°F | Resp 16 | Ht 61.0 in | Wt 198.0 lb

## 2018-03-06 DIAGNOSIS — Z720 Tobacco use: Secondary | ICD-10-CM | POA: Diagnosis not present

## 2018-03-06 DIAGNOSIS — J449 Chronic obstructive pulmonary disease, unspecified: Secondary | ICD-10-CM

## 2018-03-06 DIAGNOSIS — E119 Type 2 diabetes mellitus without complications: Secondary | ICD-10-CM

## 2018-03-06 LAB — CBC WITH DIFFERENTIAL/PLATELET
Basophils Absolute: 81 cells/uL (ref 0–200)
Basophils Relative: 0.7 %
Eosinophils Absolute: 460 cells/uL (ref 15–500)
Eosinophils Relative: 4 %
HCT: 43.2 % (ref 35.0–45.0)
Hemoglobin: 14.7 g/dL (ref 11.7–15.5)
Lymphs Abs: 2795 cells/uL (ref 850–3900)
MCH: 30.1 pg (ref 27.0–33.0)
MCHC: 34 g/dL (ref 32.0–36.0)
MCV: 88.3 fL (ref 80.0–100.0)
MPV: 10.9 fL (ref 7.5–12.5)
Monocytes Relative: 6.3 %
Neutro Abs: 7441 cells/uL (ref 1500–7800)
Neutrophils Relative %: 64.7 %
Platelets: 266 10*3/uL (ref 140–400)
RBC: 4.89 10*6/uL (ref 3.80–5.10)
RDW: 11.7 % (ref 11.0–15.0)
Total Lymphocyte: 24.3 %
WBC mixed population: 725 cells/uL (ref 200–950)
WBC: 11.5 10*3/uL — ABNORMAL HIGH (ref 3.8–10.8)

## 2018-03-06 LAB — LIPID PANEL
Cholesterol: 220 mg/dL — ABNORMAL HIGH (ref <200)
HDL: 32 mg/dL — ABNORMAL LOW (ref >50)
LDL Cholesterol (Calc): 156 mg/dL (calc) — ABNORMAL HIGH
Non-HDL Cholesterol (Calc): 188 mg/dL (calc) — ABNORMAL HIGH (ref <130)
Total CHOL/HDL Ratio: 6.9 (calc) — ABNORMAL HIGH (ref <5.0)
Triglycerides: 183 mg/dL — ABNORMAL HIGH (ref <150)

## 2018-03-06 LAB — COMPREHENSIVE METABOLIC PANEL
AG Ratio: 1.8 (calc) (ref 1.0–2.5)
ALT: 53 U/L — ABNORMAL HIGH (ref 6–29)
AST: 26 U/L (ref 10–30)
Albumin: 4.4 g/dL (ref 3.6–5.1)
Alkaline phosphatase (APISO): 94 U/L (ref 33–115)
BUN: 11 mg/dL (ref 7–25)
CO2: 25 mmol/L (ref 20–32)
Calcium: 9.1 mg/dL (ref 8.6–10.2)
Chloride: 102 mmol/L (ref 98–110)
Creat: 0.64 mg/dL (ref 0.50–1.10)
Globulin: 2.5 g/dL (calc) (ref 1.9–3.7)
Glucose, Bld: 211 mg/dL — ABNORMAL HIGH (ref 65–99)
Potassium: 4.8 mmol/L (ref 3.5–5.3)
Sodium: 136 mmol/L (ref 135–146)
Total Bilirubin: 0.5 mg/dL (ref 0.2–1.2)
Total Protein: 6.9 g/dL (ref 6.1–8.1)

## 2018-03-06 LAB — HEMOGLOBIN A1C
Hgb A1c MFr Bld: 8 % of total Hgb — ABNORMAL HIGH (ref <5.7)
Mean Plasma Glucose: 183 (calc)
eAG (mmol/L): 10.1 (calc)

## 2018-03-06 MED ORDER — VARENICLINE TARTRATE 0.5 MG X 11 & 1 MG X 42 PO MISC: ORAL | 0 refills | Status: DC

## 2018-03-06 MED ORDER — PREDNISONE 20 MG PO TABS: 40.0000 mg | ORAL_TABLET | Freq: Every day | ORAL | 0 refills | Status: DC

## 2018-03-06 MED ORDER — AZITHROMYCIN 250 MG PO TABS
ORAL_TABLET | ORAL | 0 refills | Status: DC
Start: 2018-03-06 — End: 2018-03-23

## 2018-03-06 NOTE — Patient Instructions (Signed)
Vitamin C  Take steroids/ azithromycin/using albuterol  Chantix for smoking  We will call with lab results F/U Nov/Dec for Physical

## 2018-03-06 NOTE — Progress Notes (Signed)
   Subjective:    Patient ID: Andrea [name], female    DOB: 03/29/1981, 37 y.o.   MRN: [id number]  Patient presents for Illness (x1 week- malaise, fatigue, cough- has taken some left over ABTx)   Started with cough with congestion, last week, wheezing, fatigue. Subjective fever, took 3 days of amoxicillin since she was traveling for work trip. Taking theraflu, taking tessalon perrles   No allergy meds Smoking still   DM- last A1C 72%, not taking metformin or adhering to diet, overdue for A1C  And labs         Review Of Systems:  GEN- denies fatigue, fever, weight loss,weakness, recent illness HEENT- denies eye drainage, change in vision,+ nasal discharge, CVS- denies chest pain, palpitations RESP- denies SOB, +cough+, wheeze ABD- denies N/V, change in stools, abd pain MSK- denies joint pain, muscle aches, injury Neuro- denies headache, dizziness, syncope, seizure activity       Objective:    BP 116/82   Pulse 100   Temp 98.6 F (37 C) (Oral)   Resp 16   Ht  (1.549 m)   Wt 198 lb (89.8 kg)   SpO2 98%   BMI 37.41 kg/m  GEN- NAD, alert and oriented x3 HEENT- PERRL, EOMI, non injected sclera, pink conjunctiva, MMM, oropharynx clear, no LAD , nares clear  Neck- Supple, no LAD CVS- RRR, no murmur RESP-few scattered wheeze EXT- No edema Pulses- Radial 2+        Assessment & Plan:      Problem List Items Addressed This Visit      Unprioritized   Tobacco abuse    counsled on tobacco cessation \\wants  to try chantix again      Diabetes mellitus type 2, uncomplicated (HCC) - Primary    Overdue for A1C and labs obtain today as fasting  Dicussed diet and adherence with checking her cbg's Decide on medications needed pending results       Relevant Orders   CBC with Differential/Platelet (Completed)   Comprehensive metabolic panel (Completed)   Hemoglobin A1c (Completed)   Lipid panel (Completed)   HM DIABETES FOOT EXAM (Completed)   COPD (chronic  obstructive pulmonary disease) (HCC)    Viral illness progressed to COPD exacerbation Treat with oral steroids, zpak, albuterol inhaler robitissin DM or mucinex, tessalon      Relevant Medications   predniSONE (DELTASONE) 20 MG tablet   azithromycin (ZITHROMAX) 250 MG tablet   varenicline (CHANTIX STARTING MONTH PAK) 0.5 MG X 11 & 1 MG X 42 tablet      Note: This dictation was prepared with Dragon dictation along with smaller phrase technology. Any transcriptional errors that result from this process are unintentional.

## 2018-03-09 ENCOUNTER — Encounter: Payer: Self-pay | Admitting: Family Medicine

## 2018-03-09 NOTE — Assessment & Plan Note (Signed)
Overdue for A1C and labs obtain today as fasting  Dicussed diet and adherence with checking her cbg's Decide on medications needed pending results

## 2018-03-09 NOTE — Assessment & Plan Note (Addendum)
counsled on tobacco cessation \\wants  to try chantix again

## 2018-03-09 NOTE — Assessment & Plan Note (Signed)
Viral illness progressed to COPD exacerbation Treat with oral steroids, zpak, albuterol inhaler robitissin DM or mucinex, tessalon

## 2018-03-12 ENCOUNTER — Other Ambulatory Visit: Payer: Self-pay | Admitting: *Deleted

## 2018-03-12 MED ORDER — ATORVASTATIN CALCIUM 10 MG PO TABS: 10.0000 mg | ORAL_TABLET | Freq: Every day | ORAL | 3 refills | Status: DC

## 2018-03-12 MED ORDER — METFORMIN HCL 500 MG PO TABS: 500.0000 mg | ORAL_TABLET | Freq: Two times a day (BID) | ORAL | 3 refills | Status: DC

## 2018-03-13 ENCOUNTER — Encounter: Payer: Self-pay | Admitting: Family Medicine

## 2018-03-16 ENCOUNTER — Encounter (HOSPITAL_COMMUNITY): Payer: Self-pay | Admitting: *Deleted

## 2018-03-16 ENCOUNTER — Emergency Department (HOSPITAL_COMMUNITY): Payer: BLUE CROSS/BLUE SHIELD

## 2018-03-16 ENCOUNTER — Emergency Department (HOSPITAL_COMMUNITY)
Admission: EM | Admit: 2018-03-16 | Discharge: 2018-03-17 | Disposition: A | Discharge status: Home / Self Care | Payer: BLUE CROSS/BLUE SHIELD | Attending: Emergency Medicine | Admitting: Emergency Medicine

## 2018-03-16 DIAGNOSIS — Z7984 Long term (current) use of oral hypoglycemic drugs: Secondary | ICD-10-CM | POA: Insufficient documentation

## 2018-03-16 DIAGNOSIS — E119 Type 2 diabetes mellitus without complications: Secondary | ICD-10-CM | POA: Diagnosis not present

## 2018-03-16 DIAGNOSIS — F17210 Nicotine dependence, cigarettes, uncomplicated: Secondary | ICD-10-CM | POA: Diagnosis not present

## 2018-03-16 DIAGNOSIS — Z79899 Other long term (current) drug therapy: Secondary | ICD-10-CM | POA: Insufficient documentation

## 2018-03-16 DIAGNOSIS — R1012 Left upper quadrant pain: Secondary | ICD-10-CM | POA: Diagnosis not present

## 2018-03-16 DIAGNOSIS — J449 Chronic obstructive pulmonary disease, unspecified: Secondary | ICD-10-CM | POA: Diagnosis not present

## 2018-03-16 DIAGNOSIS — I10 Essential (primary) hypertension: Secondary | ICD-10-CM | POA: Insufficient documentation

## 2018-03-16 DIAGNOSIS — R0781 Pleurodynia: Secondary | ICD-10-CM | POA: Diagnosis not present

## 2018-03-16 LAB — COMPREHENSIVE METABOLIC PANEL
ALT: 40 U/L (ref 14–54)
AST: 23 U/L (ref 15–41)
Albumin: 3.8 g/dL (ref 3.5–5.0)
Alkaline Phosphatase: 80 U/L (ref 38–126)
Anion gap: 11 (ref 5–15)
BUN: 12 mg/dL (ref 6–20)
CO2: 24 mmol/L (ref 22–32)
Calcium: 9.6 mg/dL (ref 8.9–10.3)
Chloride: 100 mmol/L — ABNORMAL LOW (ref 101–111)
Creatinine, Ser: 0.85 mg/dL (ref 0.44–1.00)
GFR calc Af Amer: >60 mL/min (ref >60)
GFR calc non Af Amer: >60 mL/min (ref >60)
Glucose, Bld: 149 mg/dL — ABNORMAL HIGH (ref 65–99)
Potassium: 4.2 mmol/L (ref 3.5–5.1)
Sodium: 135 mmol/L (ref 135–145)
Total Bilirubin: 0.6 mg/dL (ref 0.3–1.2)
Total Protein: 6.8 g/dL (ref 6.5–8.1)

## 2018-03-16 LAB — URINALYSIS, ROUTINE W REFLEX MICROSCOPIC
Bilirubin Urine: NEGATIVE
Glucose, UA: NEGATIVE mg/dL
Hgb urine dipstick: NEGATIVE
Ketones, ur: 5 mg/dL — AB
Leukocytes, UA: NEGATIVE
Nitrite: NEGATIVE
Protein, ur: NEGATIVE mg/dL
Specific Gravity, Urine: 1.01 (ref 1.005–1.030)
pH: 5 (ref 5.0–8.0)

## 2018-03-16 LAB — I-STAT BETA HCG BLOOD, ED (MC, WL, AP ONLY): I-stat hCG, quantitative: <5 m[IU]/mL (ref <5)

## 2018-03-16 LAB — CBC
HCT: 43.8 % (ref 36.0–46.0)
Hemoglobin: 14.6 g/dL (ref 12.0–15.0)
MCH: 29.7 pg (ref 26.0–34.0)
MCHC: 33.3 g/dL (ref 30.0–36.0)
MCV: 89.2 fL (ref 78.0–100.0)
Platelets: 261 10*3/uL (ref 150–400)
RBC: 4.91 MIL/uL (ref 3.87–5.11)
RDW: 12.1 % (ref 11.5–15.5)
WBC: 12 10*3/uL — ABNORMAL HIGH (ref 4.0–10.5)

## 2018-03-16 LAB — LIPASE, BLOOD: Lipase: 34 U/L (ref 11–51)

## 2018-03-16 MED ORDER — GI COCKTAIL ~~LOC~~
30.0000 mL | Freq: Once | ORAL | Status: AC
  Administered 2018-03-16: 30 mL via ORAL
  Filled 2018-03-16: qty 30

## 2018-03-16 NOTE — ED Provider Notes (Signed)
[facility] DEPART[facility] Provider Note   CSN: [id number] Arrival date & time: 03/16/18  1636     History   Chief Complaint Chief Complaint  Patient presents with  . Abdominal Pain    HPI Andrea [name] is a 37 y.o. female.  Patient presents to the emergency department with a chief complaint of left upper quadrant abdominal pain the past 3 days.  She states that she has had associated cough, but it is not productive.  She denies any fever, chills, vomiting, or diarrhea.  She denies any dysuria or hematuria.  She has recently restarted her metformin, but does not attribute symptoms to this.  She has not taken anything for her symptoms.  The history is provided by the patient. No language interpreter was used.    Past Medical History:  Diagnosis Date  . Abdominal pain   . Anxiety   . Diabetes mellitus without complication ([place])    type 2   . High cholesterol   . Hyperlipidemia   . Hypertension   . Kidney stones     Patient Active Problem List   Diagnosis Date Noted  . COPD (chronic obstructive pulmonary disease) ([place]) 06/18/2017  . Carpal tunnel syndrome 06/18/2017  . Eczema, dyshidrotic 07/29/2016  . GAD (generalized anxiety disorder) 02/22/2016  . Diabetes mellitus type 2, uncomplicated ([place]) 09/32/6712  . Chest pain on exertion 05/02/2013  . Obstructive sleep apnea 05/02/2013  . Obesity 05/02/2013  . Tobacco abuse 05/02/2013    Past Surgical History:  Procedure Laterality Date  . CHOLECYSTECTOMY  08/05/11  . KIDNEY STONE SURGERY       OB History   None      Home Medications    Prior to Admission medications   Medication Sig Start Date End Date Taking? Authorizing Provider  albuterol (PROVENTIL HFA;VENTOLIN HFA) 108 (90 Base) MCG/ACT inhaler Inhale 2 puffs into the lungs every 4 (four) hours as needed for wheezing or shortness of breath. 05/16/17   , Modena Nunnery, MD  ALPRAZolam Duanne Moron) 0.5 MG tablet Take 1 tablet (0.5 mg  total) by mouth 2 (two) times daily as needed for anxiety. 06/18/17   Alycia Rossetti, MD  atorvastatin (LIPITOR) 10 MG tablet Take 1 tablet (10 mg total) by mouth daily. 03/12/18   Alycia Rossetti, MD  azithromycin (ZITHROMAX) 250 MG tablet Take 2 tablets x 1 day, then 1 tablet daily x 4 days 03/06/18   Alycia Rossetti, MD  benzonatate (TESSALON) 200 MG capsule Take 1 capsule (200 mg total) by mouth 2 (two) times daily as needed for cough. 03/02/18   Alycia Rossetti, MD  Blood Glucose Monitoring Suppl (BLOOD GLUCOSE SYSTEM PAK) KIT Please dispense based on patient and insurance preference. Use as directed to monitor FSBS 1x daily. Dx: E11.9. 08/21/17   Alycia Rossetti, MD  Glucose Blood (BLOOD GLUCOSE TEST STRIPS) STRP Please dispense based on patient and insurance preference. Use as directed to monitor FSBS 1x daily. Dx: E11.9. 08/21/17   Alycia Rossetti, MD  Lancet Devices MISC Please dispense based on patient and insurance preference. Use as directed to monitor FSBS 1x daily. Dx: E11.9. 08/21/17   Alycia Rossetti, MD  Lancets MISC Please dispense based on patient and insurance preference. Use as directed to monitor FSBS 1x daily. Dx: E11.9. 08/21/17   Alycia Rossetti, MD  metFORMIN (GLUCOPHAGE) 500 MG tablet Take 1 tablet (500 mg total) by mouth 2 (two) times daily with a meal.  03/12/18   [place], Modena Nunnery, MD  ondansetron (ZOFRAN-ODT) 4 MG disintegrating tablet Take 1 tablet (4 mg total) by mouth every 6 (six) hours as needed for nausea or vomiting. Patient not taking: Reported on 03/06/2018 12/08/17   Alycia Rossetti, MD  predniSONE (DELTASONE) 20 MG tablet Take 2 tablets (40 mg total) by mouth daily with breakfast. For 5 days 03/06/18   Alycia Rossetti, MD  Tiotropium Bromide Monohydrate (SPIRIVA RESPIMAT) 1.25 MCG/ACT AERS Inhale 2 puffs into the lungs daily. 06/13/17   [place], Modena Nunnery, MD  varenicline (CHANTIX STARTING MONTH PAK) 0.5 MG X 11 & 1 MG X 42 tablet Take 0.5 mg tablet by  mouth 1x daily for 3 days, then increase to 0.5 mg tablet 2x daily for 4 days, then increase to 1 mg tablet 2x daily. 03/06/18   Alycia Rossetti, MD    Family History Family History  Problem Relation Age of Onset  . Heart attack Mother   . Early death Mother   . Depression Mother   . Heart disease Mother   . Hyperlipidemia Mother   . Hearing loss Father   . Heart disease Maternal Uncle   . Heart attack Maternal Uncle   . Heart attack Paternal Aunt        During a stress test  . Heart attack Paternal Uncle   . Heart attack Paternal Grandfather   . Early death Paternal Grandfather   . Heart disease Paternal Grandfather   . Hyperlipidemia Paternal Grandfather   . Heart disease Maternal Grandmother   . Miscarriages / Stillbirths Paternal Grandmother   . Heart disease Paternal Grandmother     Social History Social History   Tobacco Use  . Smoking status: Current Every Day Smoker    Packs/day: 1.00    Years: 19.00    Pack years: 19.00    Types: Cigarettes  . Smokeless tobacco: Never Used  Substance Use Topics  . Alcohol use: Yes    Alcohol/week: 0.0 oz    Comment: occ  . Drug use: No     Allergies   Patient has no known allergies.   Review of Systems Review of Systems  All other systems reviewed and are negative.    Physical Exam Updated Vital Signs BP 100/74 (BP Location: Left Arm)   Pulse (!) 101   Temp 98.9 F (37.2 C) (Oral)   Resp 18   LMP 03/01/2018 Comment: neg preg test today  SpO2 96%   Physical Exam  Constitutional: She is oriented to person, place, and time. She appears well-developed and well-nourished.  HENT:  Head: Normocephalic and atraumatic.  Eyes: Pupils are equal, round, and reactive to light. Conjunctivae and EOM are normal.  Neck: Normal range of motion. Neck supple.  Cardiovascular: Normal rate and regular rhythm. Exam reveals no gallop and no friction rub.  No murmur heard. Pulmonary/Chest: Effort normal and breath sounds  normal. No respiratory distress. She has no wheezes. She has no rales. She exhibits no tenderness.  Abdominal: Soft. Bowel sounds are normal. She exhibits no distension and no mass. There is no tenderness. There is no rebound and no guarding.  No focal abdominal tenderness, no RLQ tenderness or pain at McBurney's point, no RUQ tenderness or Murphy's sign, no left-sided abdominal tenderness, no fluid wave, or signs of peritonitis   Musculoskeletal: Normal range of motion. She exhibits no edema or tenderness.  Neurological: She is alert and oriented to person, place, and time.  Skin: Skin is  warm and dry.  Psychiatric: She has a normal mood and affect. Her behavior is normal. Judgment and thought content normal.  Nursing note and vitals reviewed.    ED Treatments / Results  Labs (all labs ordered are listed, but only abnormal results are displayed) Labs Reviewed  COMPREHENSIVE METABOLIC PANEL - Abnormal; Notable for the following components:      Result Value   Chloride 100 (*)    Glucose, Bld 149 (*)    All other components within normal limits  CBC - Abnormal; Notable for the following components:   WBC 12.0 (*)    All other components within normal limits  URINALYSIS, ROUTINE W REFLEX MICROSCOPIC - Abnormal; Notable for the following components:   Ketones, ur 5 (*)    All other components within normal limits  LIPASE, BLOOD  I-STAT BETA HCG BLOOD, ED ([facility], [facility], [facility] ONLY)    EKG None  Radiology Dg Abdomen 1 View  Result Date: 03/16/2018 CLINICAL DATA:  Chills LEFT upper quadrant pain EXAM: ABDOMEN - 1 VIEW COMPARISON:  Radiograph 02/22/2016 FINDINGS: Cholecystectomy clips in the RIGHT upper quadrant. IUD in expected location. No dilated large or small bowel. No pathologic calcifications. No organomegaly. IMPRESSION: No acute abdominal findings Electronically Signed   By: Suzy Bouchard M.D.   On: 03/16/2018 18:46    Procedures Procedures (including critical care  time)  Medications Ordered in ED Medications  gi cocktail (Maalox,Lidocaine,Donnatal) (30 mLs Oral Given 03/16/18 2248)     Initial Impression / Assessment and Plan / ED Course  I have reviewed the triage vital signs and the nursing notes.  Pertinent labs & imaging results that were available during my care of the patient were reviewed by me and considered in my medical decision making (see chart for details).     Patient with left upper abdominal pain and left inferior rib pain.  Has had a cough x1 week.  Will check chest x-ray with left ribs.  If negative, consider peptic ulcer disease.  Laboratory work-up is reassuring.  Patient is stable.  She is in no acute distress.  Chest x-ray is negative.  No pneumonia.  No abnormality about the ribs.  Could still be muscle strain/intercostal strain.  I am also concerned about peptic ulcer disease given the way she describes her symptoms.  Will try treatment with omeprazole and Carafate.  Recommend GI follow-up if no improvement in a week.  She denies any chest pain or shortness of breath.  Final Clinical Impressions(s) / ED Diagnoses   Final diagnoses:  Left upper quadrant pain    ED Discharge Orders        Ordered    omeprazole (PRILOSEC) 20 MG capsule  Daily     03/17/18 0000    sucralfate (CARAFATE) 1 g tablet  3 times daily with meals & bedtime     03/17/18 0000       Montine Circle, PA-C 03/17/18 0146    H[place], Barbette Hair, MD 03/17/18 [phone]

## 2018-03-16 NOTE — ED Triage Notes (Signed)
Pt in c/o pain to her LUQ of abdomen for the last 2-3 days, states it feels like something is moving and it has a pulsation to it- feels like area is swollen, also nausea, denies vomiting, no history of same

## 2018-03-16 NOTE — ED Notes (Signed)
Patient transported to X-ray 

## 2018-03-17 MED ORDER — SUCRALFATE 1 G PO TABS: 1.0000 g | ORAL_TABLET | Freq: Three times a day (TID) | ORAL | 0 refills | Status: DC

## 2018-03-17 MED ORDER — OMEPRAZOLE 20 MG PO CPDR: 20.0000 mg | DELAYED_RELEASE_CAPSULE | Freq: Every day | ORAL | 0 refills | Status: DC

## 2018-03-23 ENCOUNTER — Ambulatory Visit (INDEPENDENT_AMBULATORY_CARE_PROVIDER_SITE_OTHER): Payer: BLUE CROSS/BLUE SHIELD | Admitting: Family Medicine

## 2018-03-23 ENCOUNTER — Other Ambulatory Visit: Payer: Self-pay

## 2018-03-23 ENCOUNTER — Encounter: Payer: Self-pay | Admitting: Family Medicine

## 2018-03-23 VITALS — BP 118/64 | HR 100 | Temp 98.8°F | Resp 16 | Ht 61.0 in | Wt 196.0 lb

## 2018-03-23 DIAGNOSIS — R1012 Left upper quadrant pain: Secondary | ICD-10-CM

## 2018-03-23 DIAGNOSIS — J449 Chronic obstructive pulmonary disease, unspecified: Secondary | ICD-10-CM | POA: Diagnosis not present

## 2018-03-23 DIAGNOSIS — F411 Generalized anxiety disorder: Secondary | ICD-10-CM | POA: Diagnosis not present

## 2018-03-23 DIAGNOSIS — F320 Major depressive disorder, single episode, mild: Secondary | ICD-10-CM

## 2018-03-23 MED ORDER — METHOCARBAMOL 500 MG PO TABS: 500.0000 mg | ORAL_TABLET | Freq: Four times a day (QID) | ORAL | 0 refills | Status: DC | PRN

## 2018-03-23 MED ORDER — FLUOXETINE HCL 10 MG PO TABS: 10.0000 mg | ORAL_TABLET | Freq: Every day | ORAL | 3 refills | Status: DC

## 2018-03-23 NOTE — Patient Instructions (Addendum)
Stop the carafate Continue prilosec  Start prozac at once a day  Try muscle relaxer  Cancel appt for July 24th Keep appt for July 12th

## 2018-03-23 NOTE — Progress Notes (Signed)
Subjective:    Patient ID: Andrea [name], female    DOB: 1981-09-18, 37 y.o.   MRN: [id number]  Patient presents for Anxiety (increased stress and would like antidepressant); ER F/U (LUQ pain- ER thinks it may be stomach ulcer); and Cough (continues)  Pt seen in ER with LUQ pain- labs were negative, CXR.KUB negative. Given omeprazole and carafate to cover for PUD, and GI f/u if not improved , no pain with eating, no change in bowels, she feels a pulsation at times in that region. No urinary symptoms, no reflux symptoms   COPD- continues to have cough, negative CXR 1 week ago, she stopped chantix, given for tobacco cessation, states   ,   She is on spiriva and has albuterol prn   GAD/Depression- long standing has been given multiple meds to try, lexapro, effexor, wellbutrin, currently on xanax 0.5mg   Was on prozac as a teen, does not remember any SE Been overwhelmed with trying to quit smoking and losing weight.  Feels like everything that she was is being taken from her.  He stopped the Chantix when she just felt like everything was going out of control she is not sure if it was the medicine or just her nerves in general but does not want to try to quit smoking at this time will work on diet. She would like to try something else to help with her nerves during this time.  She finds herself tearful at times other times very overwhelmed and anxious.    Review Of Systems:  GEN- denies fatigue, fever, weight loss,weakness, recent illness HEENT- denies eye drainage, change in vision, nasal discharge, CVS- denies chest pain, palpitations RESP- denies SOB, cough, wheeze ABD- denies N/V, change in stools, abd pain GU- denies dysuria, hematuria, dribbling, incontinence MSK- denies joint pain, muscle aches, injury Neuro- denies headache, dizziness, syncope, seizure activity       Objective:    BP 118/64   Pulse 100   Temp 98.8 F (37.1 C) (Oral)   Resp 16   Ht 5\' 1"  (1.549 m)   Wt  196 lb (88.9 kg)   LMP 03/01/2018 Comment: neg preg test today  SpO2 97%   BMI 37.03 kg/m  GEN- NAD, alert and oriented x3 HEENT- PERRL, EOMI, non injected sclera, pink conjunctiva, MMM, oropharynx clear Neck- Supple, no thyromegaly CVS- RRR, no murmur RESP-CTAB ABD-NABS,soft,ND, mild TTP LUQ, no rebound, no guarding, mild TTP left  lower ribs intermittantly, no CVA tenderness Psych- stressed, tearful during visit, good eye contact, normal  EXT- No edema Pulses- Radial, DP- 2+        Assessment & Plan:      Problem List Items Addressed This Visit      Unprioritized   COPD (chronic obstructive pulmonary disease) (HCC)    Chronic cough but continues to smoke.  I have given her sample of Symbicort 80-4.5mg  2 puff BID t to try to the Spiriva she states she does not feel much difference with the use of the Spiriva      Depression, major, single episode, mild (HCC)   Relevant Medications   FLUoxetine (PROZAC) 10 MG tablet   GAD (generalized anxiety disorder)    She is more high anxiety and nervousness compared to the depression.  However symptoms of both.  We will start her on fluoxetine 10 mg she has been on this many years ago.  We will follow-up in 4 weeks.  She is to call with any concerns with the  medication.  She rarely uses the alprazolam      Relevant Medications   FLUoxetine (PROZAC) 10 MG tablet    Other Visit Diagnoses    LUQ pain    -  Primary   symptoms not classic of pUD, no true sign of colitis either, no spleen injury noted  possible MSK but that doesnt quite fit either, discussed getting CT scan to look at LUQ vs treating for muscular etiology with cough Will try robaxin first, next step CT abd if not improved or worsened, if that is indeed negative send to GI for scope Stop carafate, can continue omeprazole      Note: This dictation was prepared with Dragon dictation along with smaller phrase technology. Any transcriptional errors that result from this  process are unintentional.

## 2018-03-23 NOTE — Assessment & Plan Note (Addendum)
Chronic cough but continues to smoke.  I have given her sample of Symbicort 80-4.5mg  2 puff BID t to try to the Spiriva she states she does not feel much difference with the use of the Spiriva

## 2018-03-23 NOTE — Assessment & Plan Note (Signed)
She is more high anxiety and nervousness compared to the depression.  However symptoms of both.  We will start her on fluoxetine 10 mg she has been on this many years ago.  We will follow-up in 4 weeks.  She is to call with any concerns with the medication.  She rarely uses the alprazolam

## 2018-04-14 ENCOUNTER — Other Ambulatory Visit: Payer: Self-pay | Admitting: Family Medicine

## 2018-04-24 ENCOUNTER — Encounter: Payer: Self-pay | Admitting: Family Medicine

## 2018-04-24 ENCOUNTER — Other Ambulatory Visit: Payer: Self-pay

## 2018-04-24 ENCOUNTER — Ambulatory Visit (INDEPENDENT_AMBULATORY_CARE_PROVIDER_SITE_OTHER): Payer: BLUE CROSS/BLUE SHIELD | Admitting: Family Medicine

## 2018-04-24 VITALS — BP 112/68 | HR 100 | Temp 97.9°F | Resp 14 | Ht 61.0 in | Wt 195.0 lb

## 2018-04-24 DIAGNOSIS — F411 Generalized anxiety disorder: Secondary | ICD-10-CM

## 2018-04-24 DIAGNOSIS — J449 Chronic obstructive pulmonary disease, unspecified: Secondary | ICD-10-CM

## 2018-04-24 MED ORDER — BUDESONIDE-FORMOTEROL FUMARATE 80-4.5 MCG/ACT IN AERO: 2.0000 | INHALATION_SPRAY | Freq: Two times a day (BID) | RESPIRATORY_TRACT | 6 refills | Status: DC

## 2018-04-24 NOTE — Patient Instructions (Addendum)
F/U 4 months  Use symbicort 1 puff twice a day  Continue prozac 10mg 

## 2018-04-24 NOTE — Progress Notes (Signed)
   Subjective:    Patient ID: Andrea [name], female    DOB: 04-20-1981, 37 y.o.   MRN: [id number]  Patient presents for Follow-up (is not fasting)    Abd pain resolved since last visit,muscle relaxers worked   COPD- continues to smoke, given trial of symbicort, her cough has resolved, she actually stopped using, has not required her albuterol  GAD- doing well on prozac, does not feel anxious,sleeping well. No major side effects, no concerns with the medication      Review Of Systems:  GEN- denies fatigue, fever, weight loss,weakness, recent illness HEENT- denies eye drainage, change in vision, nasal discharge, CVS- denies chest pain, palpitations RESP- denies SOB, cough, wheeze ABD- denies N/V, change in stools, abd pain GU- denies dysuria, hematuria, dribbling, incontinence MSK- denies joint pain, muscle aches, injury Neuro- denies headache, dizziness, syncope, seizure activity       Objective:    BP 112/68   Pulse 100   Temp 97.9 F (36.6 C) (Oral)   Resp 14   Ht 5\' 1"  (1.549 m)   Wt 195 lb (88.5 kg)   SpO2 95%   BMI 36.84 kg/m  GEN- NAD, alert and oriented x3 CVS- RRR, no murmur RESP-CTAB Psych- normal affect and mood EXT- No edema Pulses- Radial  2+        Assessment & Plan:      Problem List Items Addressed This Visit      Unprioritized   COPD (chronic obstructive pulmonary disease) (HCC) - Primary    Discussed how symbicort is preventative medication Recommend she restart, she can decrease to 1 puff BID and see how she does Also needs tobacco cessation, but not committed to this at this time   Note she asked about getting pregnant at her age and with her issues, advised she would be considered higher risk especially with the diabetes but recommend she have consultation with her GYN      Relevant Medications   budesonide-formoterol (SYMBICORT) 80-4.5 MCG/ACT inhaler   GAD (generalized anxiety disorder)    Symptoms have improved continue  prozac 10mg          Note: This dictation was prepared with Dragon dictation along with smaller phrase technology. Any transcriptional errors that result from this process are unintentional.

## 2018-04-25 ENCOUNTER — Encounter: Payer: Self-pay | Admitting: Family Medicine

## 2018-04-25 NOTE — Assessment & Plan Note (Signed)
Symptoms have improved continue prozac 10mg 

## 2018-04-25 NOTE — Assessment & Plan Note (Signed)
Discussed how symbicort is preventative medication Recommend she restart, she can decrease to 1 puff BID and see how she does Also needs tobacco cessation, but not committed to this at this time   Note she asked about getting pregnant at her age and with her issues, advised she would be considered higher risk especially with the diabetes but recommend she have consultation with her GYN

## 2018-05-05 ENCOUNTER — Encounter: Payer: Self-pay | Admitting: Family Medicine

## 2018-05-08 ENCOUNTER — Ambulatory Visit: Payer: BLUE CROSS/BLUE SHIELD | Admitting: Family Medicine

## 2018-05-13 DIAGNOSIS — Z30432 Encounter for removal of intrauterine contraceptive device: Secondary | ICD-10-CM | POA: Diagnosis not present

## 2018-05-27 ENCOUNTER — Other Ambulatory Visit: Payer: Self-pay | Admitting: Family Medicine

## 2018-07-27 ENCOUNTER — Ambulatory Visit (INDEPENDENT_AMBULATORY_CARE_PROVIDER_SITE_OTHER): Payer: BLUE CROSS/BLUE SHIELD | Admitting: *Deleted

## 2018-07-27 DIAGNOSIS — Z23 Encounter for immunization: Secondary | ICD-10-CM

## 2018-08-19 ENCOUNTER — Other Ambulatory Visit: Payer: Self-pay | Admitting: Family Medicine

## 2018-09-02 ENCOUNTER — Inpatient Hospital Stay (HOSPITAL_COMMUNITY): Payer: BLUE CROSS/BLUE SHIELD

## 2018-09-02 ENCOUNTER — Inpatient Hospital Stay (HOSPITAL_COMMUNITY)
Admission: AD | Admit: 2018-09-02 | Discharge: 2018-09-03 | Disposition: A | Discharge status: Home / Self Care | Payer: BLUE CROSS/BLUE SHIELD | Source: Ambulatory Visit | Attending: Obstetrics and Gynecology | Admitting: Obstetrics and Gynecology

## 2018-09-02 ENCOUNTER — Telehealth: Payer: Self-pay | Admitting: *Deleted

## 2018-09-02 ENCOUNTER — Encounter (HOSPITAL_COMMUNITY): Payer: Self-pay

## 2018-09-02 DIAGNOSIS — O209 Hemorrhage in early pregnancy, unspecified: Secondary | ICD-10-CM | POA: Diagnosis not present

## 2018-09-02 DIAGNOSIS — O200 Threatened abortion: Secondary | ICD-10-CM

## 2018-09-02 DIAGNOSIS — O3411 Maternal care for benign tumor of corpus uteri, first trimester: Secondary | ICD-10-CM | POA: Diagnosis not present

## 2018-09-02 DIAGNOSIS — O99331 Smoking (tobacco) complicating pregnancy, first trimester: Secondary | ICD-10-CM | POA: Insufficient documentation

## 2018-09-02 DIAGNOSIS — Z3A00 Weeks of gestation of pregnancy not specified: Secondary | ICD-10-CM | POA: Diagnosis not present

## 2018-09-02 DIAGNOSIS — F17210 Nicotine dependence, cigarettes, uncomplicated: Secondary | ICD-10-CM | POA: Insufficient documentation

## 2018-09-02 DIAGNOSIS — R102 Pelvic and perineal pain: Secondary | ICD-10-CM | POA: Diagnosis not present

## 2018-09-02 LAB — URINALYSIS, ROUTINE W REFLEX MICROSCOPIC
Bilirubin Urine: NEGATIVE
Glucose, UA: NEGATIVE mg/dL
Ketones, ur: 5 mg/dL — AB
Leukocytes, UA: NEGATIVE
Nitrite: NEGATIVE
Protein, ur: NEGATIVE mg/dL
RBC / HPF: >50 RBC/hpf — ABNORMAL HIGH (ref 0–5)
Specific Gravity, Urine: 1.023 (ref 1.005–1.030)
pH: 5 (ref 5.0–8.0)

## 2018-09-02 LAB — WET PREP, GENITAL
Sperm: NONE SEEN
Trich, Wet Prep: NONE SEEN
Yeast Wet Prep HPF POC: NONE SEEN

## 2018-09-02 LAB — CBC
HCT: 44 % (ref 36.0–46.0)
Hemoglobin: 14.9 g/dL (ref 12.0–15.0)
MCH: 31.2 pg (ref 26.0–34.0)
MCHC: 33.9 g/dL (ref 30.0–36.0)
MCV: 92.1 fL (ref 80.0–100.0)
Platelets: 262 10*3/uL (ref 150–400)
RBC: 4.78 MIL/uL (ref 3.87–5.11)
RDW: 12.1 % (ref 11.5–15.5)
WBC: 13 10*3/uL — ABNORMAL HIGH (ref 4.0–10.5)
nRBC: 0 % (ref 0.0–0.2)

## 2018-09-02 LAB — HCG, QUANTITATIVE, PREGNANCY: hCG, Beta Chain, Quant, S: 26 m[IU]/mL — ABNORMAL HIGH (ref <5)

## 2018-09-02 LAB — POCT PREGNANCY, URINE: Preg Test, Ur: NEGATIVE

## 2018-09-02 MED ORDER — IBUPROFEN 600 MG PO TABS
600.0000 mg | ORAL_TABLET | Freq: Once | ORAL | Status: AC
  Administered 2018-09-02: 600 mg via ORAL
  Filled 2018-09-02: qty 1

## 2018-09-02 NOTE — Telephone Encounter (Signed)
She can stop the metformin, lipitor, no muscle relaxers Continue prozac until seen by GYN  Continue the inhalers

## 2018-09-02 NOTE — Telephone Encounter (Signed)
NOTED

## 2018-09-02 NOTE — Telephone Encounter (Signed)
Call placed to patient and patient made aware.   Patient states that she stopped all medications when she ws trying to conceive. She has not been on medications for some time.

## 2018-09-02 NOTE — MAU Note (Addendum)
Positive HPT last Monday (11/11).  Started having spotting this morning that has increased with abdominal cramping that started today.  Cramping shoots down her right leg.  No clots/tissue.  LMP 10/16

## 2018-09-02 NOTE — Telephone Encounter (Signed)
Received call from patient.   Reports that she has positive home pregnancy test. She believes she is around 4 weeks into gestation. She has appointment scheduled with OB GYN.   MD to be made aware.

## 2018-09-02 NOTE — Discharge Instructions (Signed)
Vaginal Bleeding During Pregnancy, First Trimester A small amount of bleeding (spotting) from the vagina is relatively common in early pregnancy. It usually stops on its own. Various things may cause bleeding or spotting in early pregnancy. Some bleeding may be related to the pregnancy, and some may not. In most cases, the bleeding is normal and is not a problem. However, bleeding can also be a sign of something serious. Be sure to tell your health care provider about any vaginal bleeding right away. Some possible causes of vaginal bleeding during the first trimester include:  Infection or inflammation of the cervix.  Growths (polyps) on the cervix.  Miscarriage or threatened miscarriage.  Pregnancy tissue has developed outside of the uterus and in a fallopian tube (tubal pregnancy).  Tiny cysts have developed in the uterus instead of pregnancy tissue (molar pregnancy).  Follow these instructions at home: Watch your condition for any changes. The following actions may help to lessen any discomfort you are feeling:  Follow your health care provider's instructions for limiting your activity. If your health care provider orders bed rest, you may need to stay in bed and only get up to use the bathroom. However, your health care provider may allow you to continue light activity.  If needed, make plans for someone to help with your regular activities and responsibilities while you are on bed rest.  Keep track of the number of pads you use each day, how often you change pads, and how soaked (saturated) they are. Write this down.  Do not use tampons. Do not douche.  Do not have sexual intercourse or orgasms until approved by your health care provider.  If you pass any tissue from your vagina, save the tissue so you can show it to your health care provider.  Only take over-the-counter or prescription medicines as directed by your health care provider.  Do not take aspirin because it can make  you bleed.  Keep all follow-up appointments as directed by your health care provider.  Contact a health care provider if:  You have any vaginal bleeding during any part of your pregnancy.  You have cramps or labor pains.  You have a fever, not controlled by medicine. Get help right away if:  You have severe cramps in your back or belly (abdomen).  You pass large clots or tissue from your vagina.  Your bleeding increases.  You feel light-headed or weak, or you have fainting episodes.  You have chills.  You are leaking fluid or have a gush of fluid from your vagina.  You pass out while having a bowel movement. This information is not intended to replace advice given to you by your health care provider. Make sure you discuss any questions you have with your health care provider. Document Released: 07/10/2005 Document Revised: 03/07/2016 Document Reviewed: 06/07/2013 Elsevier Interactive Patient Education  2018 [employer].  Threatened Miscarriage A threatened miscarriage occurs when you have vaginal bleeding during your first 20 weeks of pregnancy but the pregnancy has not ended. If you have vaginal bleeding during this time, your health care provider will do tests to make sure you are still pregnant. If the tests show you are still pregnant and the developing baby (fetus) inside your womb (uterus) is still growing, your condition is considered a threatened miscarriage. A threatened miscarriage does not mean your pregnancy will end, but it does increase the risk of losing your pregnancy (complete miscarriage). What are the causes? The cause of a threatened miscarriage is  usually not known. If you go on to have a complete miscarriage, the most common cause is an abnormal number of chromosomes in the developing baby. Chromosomes are the structures inside cells that hold all your genetic material. Some causes of vaginal bleeding that do not result in miscarriage include:  Having  sex.  Having an infection.  Normal hormone changes of pregnancy.  Bleeding that occurs when an egg implants in your uterus.  What increases the risk? Risk factors for bleeding in early pregnancy include:  Obesity.  Smoking.  Drinking excessive amounts of alcohol or caffeine.  Recreational drug use.  What are the signs or symptoms?  Light vaginal bleeding.  Mild abdominal pain or cramps. How is this diagnosed? If you have bleeding with or without abdominal pain before 20 weeks of pregnancy, your health care provider will do tests to check whether you are still pregnant. One important test involves using sound waves and a computer (ultrasound) to create images of the inside of your uterus. Other tests include an internal exam of your vagina and uterus (pelvic exam) and measurement of your babys heart rate. You may be diagnosed with a threatened miscarriage if:  Ultrasound testing shows you are still pregnant.  Your babys heart rate is strong.  A pelvic exam shows that the opening between your uterus and your vagina (cervix) is closed.  Your heart rate and blood pressure are stable.  Blood tests confirm you are still pregnant.  How is this treated? No treatments have been shown to prevent a threatened miscarriage from going on to a complete miscarriage. However, the right home care is important. Follow these instructions at home:  Make sure you keep all your appointments for prenatal care. This is very important.  Get plenty of rest.  Do not have sex or use tampons if you have vaginal bleeding.  Do not douche.  Do not smoke or use recreational drugs.  Do not drink alcohol.  Avoid caffeine. Contact a health care provider if:  You have light vaginal bleeding or spotting while pregnant.  You have abdominal pain or cramping.  You have a fever. Get help right away if:  You have heavy vaginal bleeding.  You have blood clots coming from your vagina.  You  have severe low back pain or abdominal cramps.  You have fever, chills, and severe abdominal pain. This information is not intended to replace advice given to you by your health care provider. Make sure you discuss any questions you have with your health care provider. Document Released: 09/30/2005 Document Revised: 03/07/2016 Document Reviewed: 07/27/2013 Elsevier Interactive Patient Education  [employer]2018 [employer] Inc[employer]

## 2018-09-02 NOTE — MAU Provider Note (Signed)
Chief Complaint: Abdominal Pain and Vaginal Bleeding   First Provider Initiated Contact with Patient 09/02/18 2317                           Room 2     [facility]  SUBJECTIVE HPI: Andrea [name] is a 37 y.o. G3P2000 at Unknown by LMP who presents to maternity admissions reporting spotting today with pelvic cramping.  Pain radiates down right leg. She denies vaginal itching/burning, urinary symptoms, h/a, dizziness, n/v, or fever/chills.    Abdominal Pain  This is a new problem. The current episode started today. The onset quality is gradual. The problem occurs intermittently. The problem has been unchanged. The pain is located in the suprapubic region. The quality of the pain is cramping. Pain radiation: right leg. Pertinent negatives include no constipation, diarrhea, dysuria or fever. Nothing aggravates the pain. The pain is relieved by nothing. She has tried nothing for the symptoms.  Vaginal Bleeding  The patient's primary symptoms include pelvic pain and vaginal bleeding. The patient's pertinent negatives include no genital itching, genital lesions or genital odor. This is a new problem. The current episode started today. The problem occurs intermittently. The problem has been unchanged. The pain is mild. The problem affects both sides. She is pregnant. Associated symptoms include abdominal pain. Pertinent negatives include no constipation, diarrhea, dysuria or fever. The vaginal discharge was bloody. The vaginal bleeding is spotting. She has not been passing clots. She has not been passing tissue. Nothing aggravates the symptoms. She has tried nothing for the symptoms.   RN Note: Positive HPT last Monday (11/11).  Started having spotting this morning that has increased with abdominal cramping that started today.  Cramping shoots down her right leg.  No clots/tissue.  LMP 10/16  Past Medical History:  Diagnosis Date  . Abdominal pain   . Anxiety   . Diabetes mellitus without complication  ([place])    type 2   . High cholesterol   . Hyperlipidemia   . Hypertension   . Kidney stones    Past Surgical History:  Procedure Laterality Date  . CHOLECYSTECTOMY  08/05/11  . KIDNEY STONE SURGERY     Social History   Socioeconomic History  . Marital status: Married    Spouse name: "Kwesi"  . Number of children: 2  . Years of education: 12+  . Highest education level: Not on file  Occupational History  . 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 Financial resource strain: Not on file  . Food insecurity:    Worry: Not on file    Inability: Not on file  . Transportation needs:    Medical: Not on file    Non-medical: Not on file  Tobacco Use  . Smoking status: Current Every Day Smoker    Packs/day: 1.00    Years: 19.00    Pack years: 19.00    Types: Cigarettes  . Smokeless tobacco: Never Used  Substance and Sexual Activity  . Alcohol use: Yes    Alcohol/week: 0.0 standard drinks    Comment: occ  . Drug use: No  . Sexual activity: Yes    Partners: Male    Birth control/protection: IUD  Lifestyle  . Physical activity:    Days per week: Not on file    Minutes per session: Not on file  . Stress: Not on file  Relationships  . Social connections:  Talks on phone: Not on file    Gets together: Not on file    Attends religious service: Not on file    Active member of club or organization: Not on file    Attends meetings of clubs or organizations: Not on file    Relationship status: Not on file  . Intimate partner violence:    Fear of current or ex partner: Not on file    Emotionally abused: Not on file    Physically abused: Not on file    Forced sexual activity: Not on file  Other Topics Concern  . Not on file  Social History Narrative   Trained as a Occupational psychologist.   Lives with her husband and both their children.   No current facility-administered medications on file prior to encounter.     Current Outpatient Medications on File Prior to Encounter  Medication Sig Dispense Refill  . albuterol (PROVENTIL HFA;VENTOLIN HFA) 108 (90 Base) MCG/ACT inhaler Inhale 2 puffs into the lungs every 4 (four) hours as needed for wheezing or shortness of breath. 1 Inhaler 0  . ALPRAZolam (XANAX) 0.5 MG tablet Take 1 tablet (0.5 mg total) by mouth 2 (two) times daily as needed for anxiety. 30 tablet 1  . atorvastatin (LIPITOR) 10 MG tablet Take 1 tablet (10 mg total) by mouth daily. 90 tablet 3  . Blood Glucose Monitoring Suppl (BLOOD GLUCOSE SYSTEM PAK) KIT Please dispense based on patient and insurance preference. Use as directed to monitor FSBS 1x daily. Dx: E11.9. 1 each 1  . budesonide-formoterol (SYMBICORT) 80-4.5 MCG/ACT inhaler Inhale 2 puffs into the lungs 2 (two) times daily. 1 Inhaler 6  . FLUoxetine (PROZAC) 10 MG tablet TAKE 1 TABLET BY MOUTH EVERY DAY 90 tablet 2  . Glucose Blood (BLOOD GLUCOSE TEST STRIPS) STRP Please dispense based on patient and insurance preference. Use as directed to monitor FSBS 1x daily. Dx: E11.9. 100 each 1  . Lancet Devices MISC Please dispense based on patient and insurance preference. Use as directed to monitor FSBS 1x daily. Dx: E11.9. 100 each 1  . Lancets MISC Please dispense based on patient and insurance preference. Use as directed to monitor FSBS 1x daily. Dx: E11.9. 100 each 1  . metFORMIN (GLUCOPHAGE) 500 MG tablet TAKE 1 TABLET BY MOUTH DAILY WITH BREAKFAST 90 tablet 1  . metFORMIN (GLUCOPHAGE) 500 MG tablet TAKE 1 TABLET BY MOUTH TWICE A DAY WITH A MEAL 180 tablet 1  . methocarbamol (ROBAXIN) 500 MG tablet Take 1 tablet (500 mg total) by mouth every 6 (six) hours as needed for muscle spasms. 20 tablet 0   No Known Allergies  I have reviewed patient's Past Medical Hx, Surgical Hx, Family Hx, Social Hx, medications and allergies.   ROS:  Review of Systems  Constitutional: Negative for fever.  Gastrointestinal: Positive for abdominal pain.  Negative for constipation and diarrhea.  Genitourinary: Positive for pelvic pain and vaginal bleeding. Negative for dysuria.   Review of Systems  Other systems negative   Physical Exam  Physical Exam Patient Vitals for the past 24 hrs:  BP Temp Pulse Resp Height Weight  09/02/18 1927 (!) 135/92 99.1 F (37.3 C) (!) 105 19 '5\' 1"'$  (1.549 m) 88.5 kg   Constitutional: Well-developed, well-nourished female in no acute distress.  Cardiovascular: normal rate Respiratory: normal effort GI: Abd soft, non-tender. Pos BS x 4 MS: Extremities nontender, no edema, normal ROM Neurologic: Alert and oriented x 4.  GU: Neg CVAT.  PELVIC EXAM: Cervix  pink, visually closed, without lesion, small to moderate amount red discharge, vaginal walls and external genitalia normal Bimanual exam: Cervix 0/long/high, firm, anterior, neg CMT, uterus nontender, nonenlarged, adnexa without tenderness, enlargement, or mass   LAB RESULTS Results for orders placed or performed during the hospital encounter of 09/02/18 (from the past 24 hour(s))  Urinalysis, Routine w reflex microscopic     Status: Abnormal   Collection Time: 09/02/18  7:35 PM  Result Value Ref Range   Color, Urine YELLOW YELLOW   APPearance HAZY (A) CLEAR   Specific Gravity, Urine 1.023 1.005 - 1.030   pH 5.0 5.0 - 8.0   Glucose, UA NEGATIVE NEGATIVE mg/dL   Hgb urine dipstick LARGE (A) NEGATIVE   Bilirubin Urine NEGATIVE NEGATIVE   Ketones, ur 5 (A) NEGATIVE mg/dL   Protein, ur NEGATIVE NEGATIVE mg/dL   Nitrite NEGATIVE NEGATIVE   Leukocytes, UA NEGATIVE NEGATIVE   RBC / HPF >50 (H) 0 - 5 RBC/hpf   WBC, UA 6-10 0 - 5 WBC/hpf   Bacteria, UA RARE (A) NONE SEEN   Squamous Epithelial / LPF 0-5 0 - 5   Mucus PRESENT   Pregnancy, urine POC     Status: None   Collection Time: 09/02/18  7:39 PM  Result Value Ref Range   Preg Test, Ur NEGATIVE NEGATIVE  hCG, quantitative, pregnancy     Status: Abnormal   Collection Time: 09/02/18  8:26 PM   Result Value Ref Range   hCG, Beta Chain, Quant, S 26 (H) <5 mIU/mL  CBC     Status: Abnormal   Collection Time: 09/02/18  8:26 PM  Result Value Ref Range   WBC 13.0 (H) 4.0 - 10.5 K/uL   RBC 4.78 3.87 - 5.11 MIL/uL   Hemoglobin 14.9 12.0 - 15.0 g/dL   HCT 44.0 36.0 - 46.0 %   MCV 92.1 80.0 - 100.0 fL   MCH 31.2 26.0 - 34.0 pg   MCHC 33.9 30.0 - 36.0 g/dL   RDW 12.1 11.5 - 15.5 %   Platelets 262 150 - 400 K/uL   nRBC 0.0 0.0 - 0.2 %      IMAGING US Ob Comp Less 14 Wks  Result Date: 09/02/2018 CLINICAL DATA:  Bleeding and cramping in a pregnant patient. EXAM: OBSTETRIC <14 WK [place] AND TRANSVAGINAL OB US TECHNIQUE: Both transabdominal and transvaginal ultrasound examinations were performed for complete evaluation of the gestation as well as the maternal uterus, adnexal regions, and pelvic cul-de-sac. Transvaginal technique was performed to assess early pregnancy. COMPARISON:  None. FINDINGS: Intrauterine gestational sac: None Maternal uterus/adnexae: The endometrial stripe thickness is 10.6 mm. Two fibromas are identified measuring 11 and 10 mm at the fundus and body respectively. No free fluid. The ovaries are unremarkable. IMPRESSION: 1. No IUP identified. In the setting of pregnancy, this could represent early pregnancy, recent miscarriage, or ectopic pregnancy. Recommend clinical correlation and close follow-up. 2. Two small fibroids in the uterus. Electronically Signed   By: Dorise Bullion III M.D   On: 09/02/2018 22:08   US Ob Transvaginal  Result Date: 09/02/2018 CLINICAL DATA:  Bleeding and cramping in a pregnant patient. EXAM: OBSTETRIC <14 WK [place] AND TRANSVAGINAL OB US TECHNIQUE: Both transabdominal and transvaginal ultrasound examinations were performed for complete evaluation of the gestation as well as the maternal uterus, adnexal regions, and pelvic cul-de-sac. Transvaginal technique was performed to assess early pregnancy. COMPARISON:  None. FINDINGS: Intrauterine gestational  sac: None Maternal uterus/adnexae: The endometrial stripe thickness is 10.6  mm. Two fibromas are identified measuring 11 and 10 mm at the fundus and body respectively. No free fluid. The ovaries are unremarkable. IMPRESSION: 1. No IUP identified. In the setting of pregnancy, this could represent early pregnancy, recent miscarriage, or ectopic pregnancy. Recommend clinical correlation and close follow-up. 2. Two small fibroids in the uterus. Electronically Signed   By: Dorise Bullion III M.D   On: 09/02/2018 22:08    MAU Management/MDM: Ordered usual first trimester r/o ectopic labs.   Pelvic exam and cultures done Will check baseline Ultrasound to rule out ectopic.  Nothing seen in uterus  This bleeding/pain can represent a normal pregnancy with bleeding, spontaneous abortion or even an ectopic which can be life-threatening.  The process as listed above helps to determine which of these is present.  Discussed results.  Recommend repeat HCG in 48 hours either here or in office Discussed since she had a positive test a week ago and HCG is so low now, this likely represents a nonviable pregnancy, although we cannot know for sure with only one HCG value  ASSESSMENT 1. Bleeding in early pregnancy   2.      Pregnancy of unknown location  PLAN Discharge home Plan to repeat HCG level in 48 hours in office or MAU, pt to call office in am to seek their recommendation Will repeat  Ultrasound in about 7-10 days if HCG levels double appropriately  Ectopic precautions  Pt stable at time of discharge. Encouraged to return here or to other Urgent Care/ED if she develops worsening of symptoms, increase in pain, fever, or other concerning symptoms.    Hansel Feinstein CNM, MSN Certified Nurse-Midwife 09/02/2018  11:17 PM

## 2018-09-03 DIAGNOSIS — O209 Hemorrhage in early pregnancy, unspecified: Secondary | ICD-10-CM

## 2018-09-03 DIAGNOSIS — O200 Threatened abortion: Secondary | ICD-10-CM | POA: Diagnosis not present

## 2018-09-03 LAB — GC/CHLAMYDIA PROBE AMP (~~LOC~~) NOT AT ARMC
Chlamydia: NEGATIVE
Neisseria Gonorrhea: NEGATIVE

## 2018-09-03 LAB — HIV ANTIBODY (ROUTINE TESTING W REFLEX): HIV Screen 4th Generation wRfx: NONREACTIVE

## 2018-09-04 ENCOUNTER — Encounter: Payer: Self-pay | Admitting: Family Medicine

## 2018-09-04 DIAGNOSIS — O200 Threatened abortion: Secondary | ICD-10-CM | POA: Diagnosis not present

## 2018-09-08 DIAGNOSIS — Z131 Encounter for screening for diabetes mellitus: Secondary | ICD-10-CM | POA: Diagnosis not present

## 2018-09-08 DIAGNOSIS — O021 Missed abortion: Secondary | ICD-10-CM | POA: Diagnosis not present

## 2018-09-08 DIAGNOSIS — N760 Acute vaginitis: Secondary | ICD-10-CM | POA: Diagnosis not present

## 2018-12-08 ENCOUNTER — Other Ambulatory Visit: Payer: Self-pay

## 2018-12-08 ENCOUNTER — Ambulatory Visit (INDEPENDENT_AMBULATORY_CARE_PROVIDER_SITE_OTHER): Payer: BLUE CROSS/BLUE SHIELD | Admitting: Family Medicine

## 2018-12-08 ENCOUNTER — Encounter: Payer: Self-pay | Admitting: Family Medicine

## 2018-12-08 VITALS — BP 112/68 | HR 112 | Temp 98.9°F | Resp 14 | Ht 61.0 in | Wt 190.0 lb

## 2018-12-08 DIAGNOSIS — R6889 Other general symptoms and signs: Secondary | ICD-10-CM | POA: Diagnosis not present

## 2018-12-08 LAB — INFLUENZA A AND B AG, IMMUNOASSAY
INFLUENZA A ANTIGEN: NOT DETECTED
INFLUENZA B ANTIGEN: NOT DETECTED

## 2018-12-08 NOTE — Progress Notes (Signed)
   Subjective:    Patient ID: Andrea [name], female    DOB: Mar 29, 1981, 38 y.o.   MRN: [id number]  Patient presents for Illness (x2 days- fever, body aches, exposure to flu)  Pt here with flu like symptoms fatigue, body aches, hot flashes, minimal cough for past 3 days, positive exposure by 2 different people at work. No vomiting or diarrhea  Note she came off her medications due to positive pregnancy test back in November. She had miscariage end of November.   DM- last A1C 8% in May  2019 at our office,also had high cholesterol with elevated LFT, started on is not taking the lipitor  A1C done at GYN in 09/08/18, it was 8.5%, she then started taking the metformin regulary  Anxiety- no longer on prozac does take the Xanax as needed.  After her miscarriage she was using this twice a day.       Review Of Systems: Per above  GEN- denies fatigue, fever, weight loss,weakness, recent illness HEENT- denies eye drainage, change in vision, nasal discharge, CVS- denies chest pain, palpitations RESP- denies SOB, +cough, wheeze ABD- denies N/V, change in stools, abd pain GU- denies dysuria, hematuria, dribbling, incontinence MSK- denies joint pain, +muscle aches, injury Neuro- denies headache, dizziness, syncope, seizure activity       Objective:    BP 112/68   Pulse (!) 112   Temp 98.9 F (37.2 C) (Oral)   Resp 14   Ht 5\' 1"  (1.549 m)   Wt 190 lb (86.2 kg)   LMP 07/29/2018 Comment: neg preg test today  HCG 26  SpO2 97%   BMI 35.90 kg/m  GEN- NAD, alert and oriented x3, toxic appearing HEENT- PERRL, EOMI, non injected sclera, pink conjunctiva, MMM, oropharynx clear Neck- Supple, no thyromegaly CVS-mild tachycardia heart rate 100 no murmur RESP-CTAB ABD-NABS,soft,NT,ND EXT- No edema Pulses- Radial 2+        Assessment & Plan:      Problem List Items Addressed This Visit    None    Visit Diagnoses    Flu-like symptoms    -  Primary   Flulike symptoms/viral  illness that started over the weekend.  Her flu test is negative in the office. Discussed treating vs symptoms managment, will hold on tamiflu. Rest fluids, OTC cough suppressant as needed Return for fasting labs and diabetes visit  Given note for work   Relevant Orders   Influenza A and B Ag, Immunoassay (Completed)      Note: This dictation was prepared with Dragon dictation along with smaller phrase technology. Any transcriptional errors that result from this process are unintentional.

## 2018-12-08 NOTE — Patient Instructions (Addendum)
F/U 2 months  Rest

## 2018-12-26 ENCOUNTER — Encounter: Payer: Self-pay | Admitting: Family Medicine

## 2018-12-28 NOTE — Telephone Encounter (Signed)
Call placed to patient.   Reports that she is concerned about COVID-19. Advised self care of proper hand and respiratory hygiene and personal space as best preventions.   Advised that there is no medical reasoning to keep her out of work nor to have her work from home.

## 2018-12-28 NOTE — Telephone Encounter (Signed)
Patient returned call  States that she is high risk for respiratory diseases and is concerned that she will get COVID-19.  Advised that virus is not in this particular area at this time. Advised that prevention is the best option at this time. Gave information on hand, respiratory hygiene and personal space.

## 2018-12-31 ENCOUNTER — Other Ambulatory Visit: Payer: Self-pay | Admitting: Family Medicine

## 2018-12-31 ENCOUNTER — Encounter: Payer: Self-pay | Admitting: Family Medicine

## 2018-12-31 MED ORDER — FLUOXETINE HCL 10 MG PO CAPS: 10.0000 mg | ORAL_CAPSULE | Freq: Every day | ORAL | 3 refills | Status: DC

## 2018-12-31 MED ORDER — ALPRAZOLAM 0.5 MG PO TABS: 0.5000 mg | ORAL_TABLET | Freq: Two times a day (BID) | ORAL | 1 refills | Status: DC | PRN

## 2019-01-01 ENCOUNTER — Encounter: Payer: Self-pay | Admitting: Family Medicine

## 2019-01-06 ENCOUNTER — Encounter: Payer: Self-pay | Admitting: Family Medicine

## 2019-01-19 ENCOUNTER — Encounter: Payer: Self-pay | Admitting: Family Medicine

## 2019-01-19 ENCOUNTER — Ambulatory Visit (INDEPENDENT_AMBULATORY_CARE_PROVIDER_SITE_OTHER): Payer: BLUE CROSS/BLUE SHIELD | Admitting: Family Medicine

## 2019-01-19 ENCOUNTER — Other Ambulatory Visit: Payer: Self-pay

## 2019-01-19 DIAGNOSIS — J441 Chronic obstructive pulmonary disease with (acute) exacerbation: Secondary | ICD-10-CM | POA: Diagnosis not present

## 2019-01-19 DIAGNOSIS — F17200 Nicotine dependence, unspecified, uncomplicated: Secondary | ICD-10-CM | POA: Diagnosis not present

## 2019-01-19 MED ORDER — BENZONATATE 100 MG PO CAPS: 100.0000 mg | ORAL_CAPSULE | Freq: Three times a day (TID) | ORAL | 1 refills | Status: DC | PRN

## 2019-01-19 MED ORDER — FLUCONAZOLE 150 MG PO TABS: ORAL_TABLET | ORAL | 1 refills | Status: DC

## 2019-01-19 MED ORDER — PREDNISONE 20 MG PO TABS: 40.0000 mg | ORAL_TABLET | Freq: Every day | ORAL | 0 refills | Status: DC

## 2019-01-19 MED ORDER — GUAIFENESIN-CODEINE 100-10 MG/5ML PO SOLN: 5.0000 mL | Freq: Four times a day (QID) | ORAL | 0 refills | Status: DC | PRN

## 2019-01-19 MED ORDER — LANCETS MISC: 1 refills | Status: DC

## 2019-01-19 MED ORDER — AZITHROMYCIN 250 MG PO TABS: ORAL_TABLET | ORAL | 0 refills | Status: DC

## 2019-01-19 MED ORDER — LANCET DEVICES MISC: 1 refills | Status: DC

## 2019-01-19 NOTE — Progress Notes (Signed)
Virtual Visit via Telephone Note  I connected with Andrea [name] on 01/19/19 at 10:18am  by telephone and verified that I am speaking with the correct person using two identifiers.  Pt location:at home  Physician location: in office, [facility], Milinda Antis MD    I discussed the limitations, risks, security and privacy concerns of performing an evaluation and management service by telephone and the availability of in person appointments. I also discussed with the patient that there may be a patient responsible charge related to this service. The patient expressed understanding and agreed to proceed.   History of Present Illness: Pt sent mychart message, she has COPD, continues to smoke  "My cough keeps getting worse. It has progressively gotten worse since I messaged you about it the first time. Last night I tried sleeping with a cough drop in my mouth and hardly slept, I still coughed hard all night long. I also have congestion. I don't usually have allergies, but I took a Claritin yesterday just to see if that would help. I'm not sure if it could be bronchitis?? Do you think an antibiotic could help? Is there any way I could possibly get some of those cough pearls to help the cough so I can sleep? Any suggestions would be very much appreciated, thank you so much! "   Past few weeks she has been coughing with congestion. Chest feels heavy. Cough wakes her from sleep. Tried cough drops which normally work but it didn't. Yesterday started with nasal congestion as well. She has been sitting outside with her daughter, so she tried the claritin. No fever but last night felt hot.  She has been using albuterol at night, when she hears the wheezing, especially week  She has not used symbicort  Has not tried any mucinex or robitussin or delsym      Observations/Objective:  Coughing over phone, able to speak in full sentences, no audible wheezing   Assessment and Plan: COPD  exacerbation- treat with Zpak, prednisone burst 40mg  x 5 days, restart symbicort  continue allergy med  for cough tessalon during the day, robitussin codeine at bedtime Given red flags, if she does not improve or worsens go to nearest hospital  Follow Up Instructions:    I discussed the assessment and treatment plan with the patient. The patient was provided an opportunity to ask questions and all were answered. The patient agreed with the plan and demonstrated an understanding of the instructions.   The patient was advised to call back or seek an in-person evaluation if the symptoms worsen or if the condition fails to improve as anticipated.  I provided 10 minutes of non-face-to-face time during this encounter. END TIME 10:28  Milinda Antis, MD

## 2019-02-10 ENCOUNTER — Other Ambulatory Visit: Payer: Self-pay | Admitting: Family Medicine

## 2019-03-01 IMAGING — US US OB TRANSVAGINAL
1 series · 15 of 28 positions shown · non-contrast
Comparison: None.

CLINICAL DATA: Bleeding and cramping in a pregnant patient.

EXAM:
OBSTETRIC <14 WK US AND TRANSVAGINAL OB US
TECHNIQUE: Both transabdominal and transvaginal ultrasound examinations were
performed for complete evaluation of the gestation as well as the
maternal uterus, adnexal regions, and pelvic cul-de-sac.
Transvaginal technique was performed to assess early pregnancy.

[Series 1: us ob transvaginal · 15 of 59 slices shown]
[im 1/59]
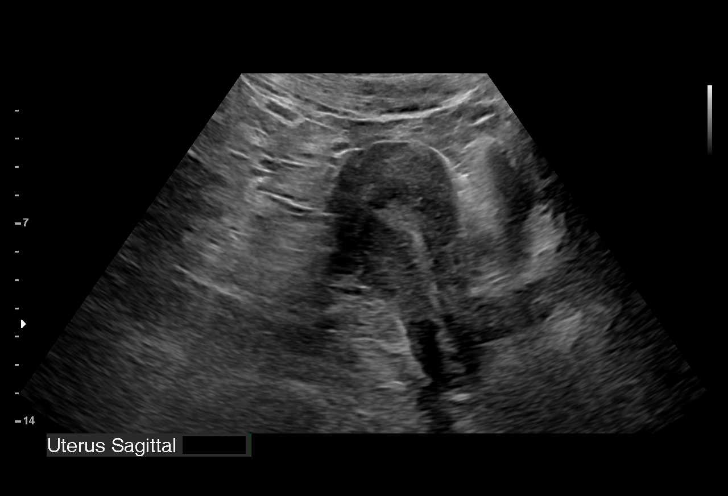
[im 5/59]
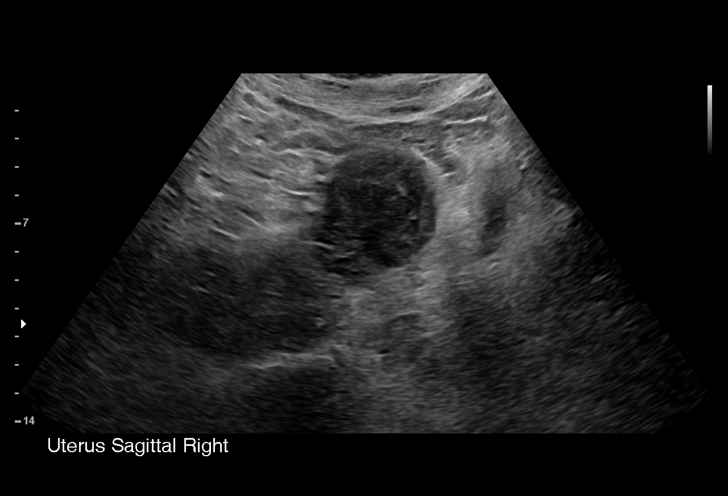
[im 9/59]
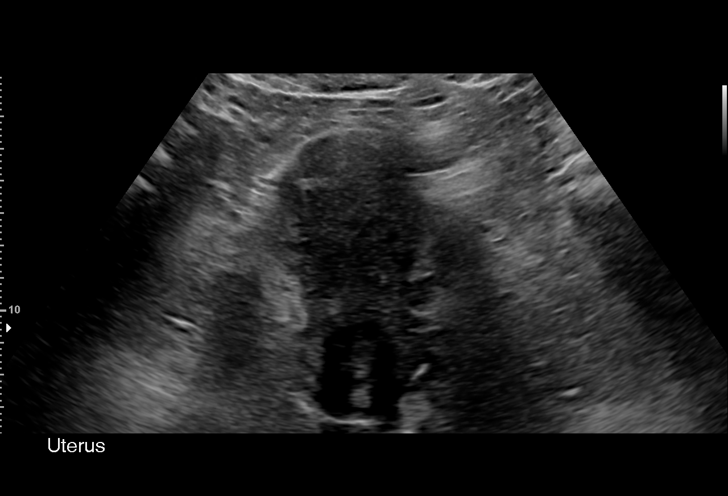
[im 13/59]
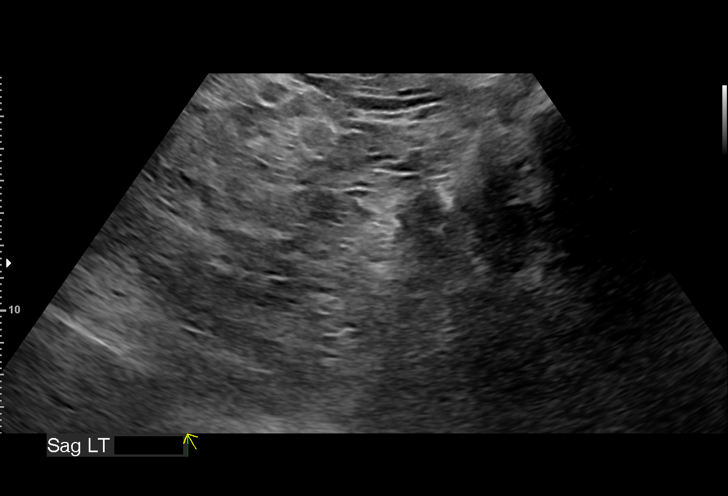
[im 18/59]
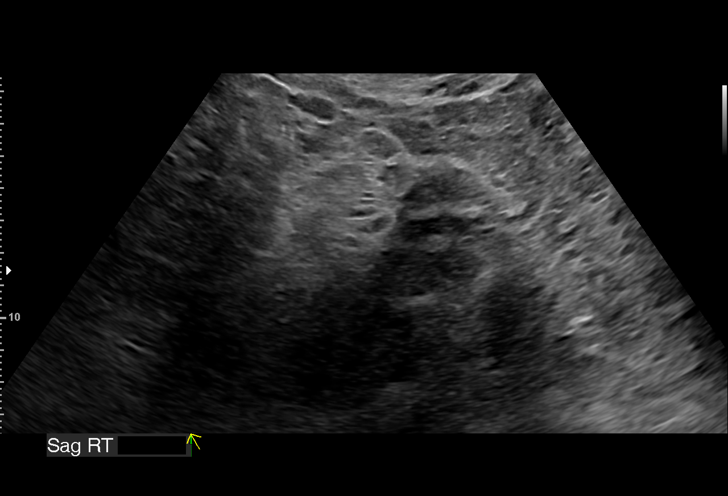
[im 22/59]
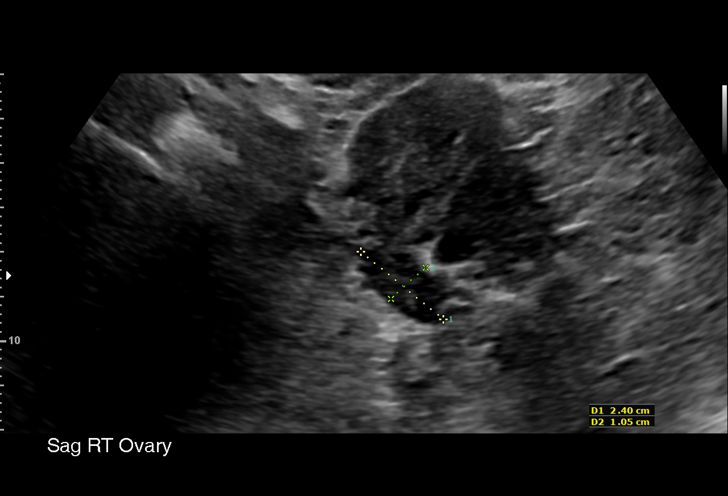
[im 26/59]
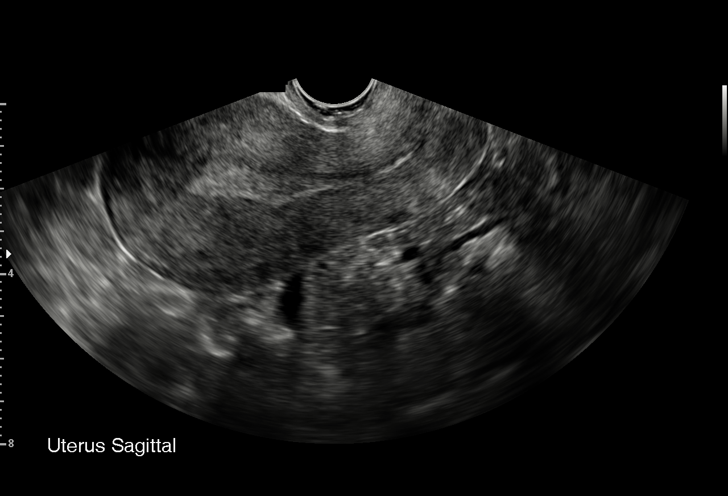
[im 31/59]
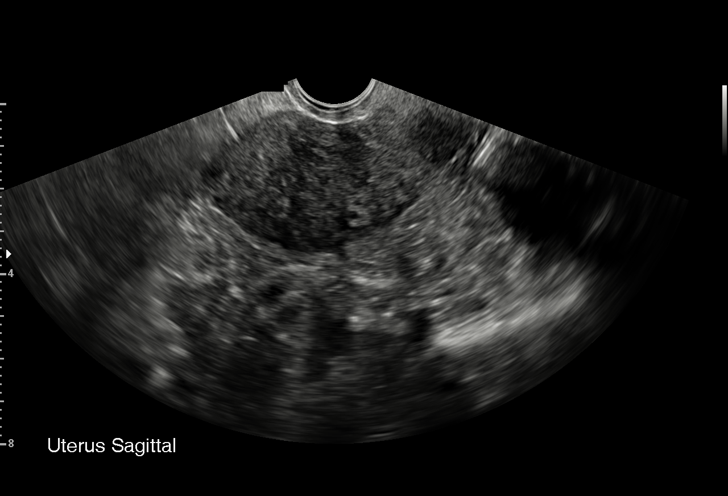
[im 33/59]
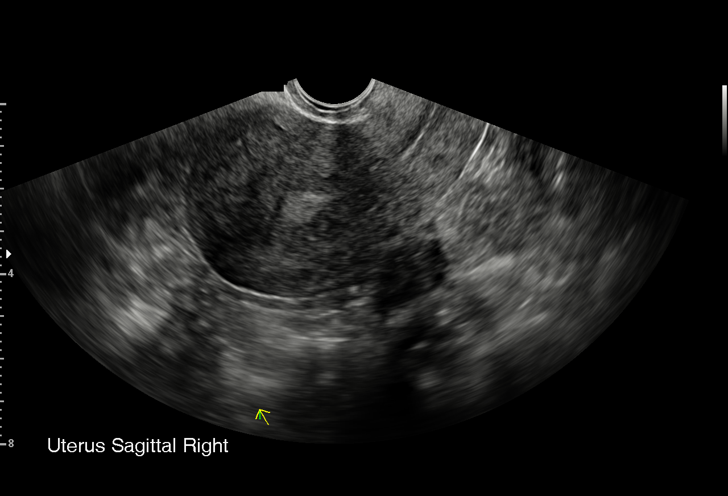
[im 37/59]
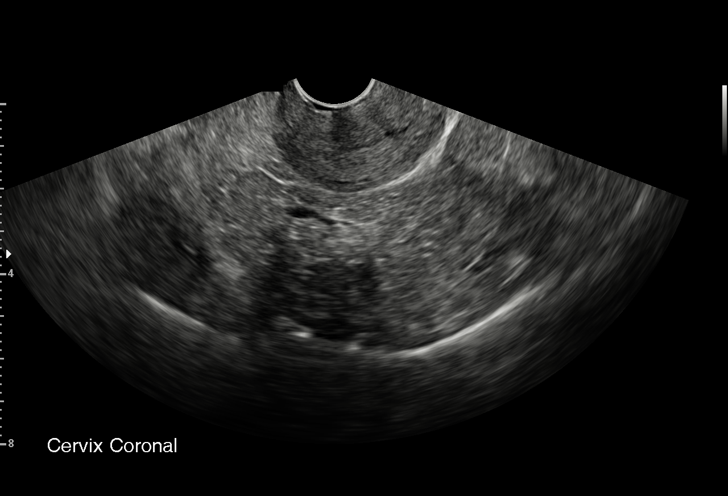
[im 41/59]
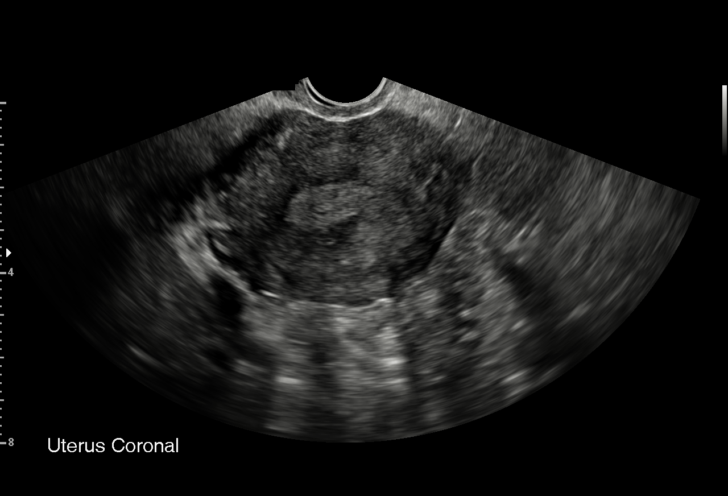
[im 46/59]
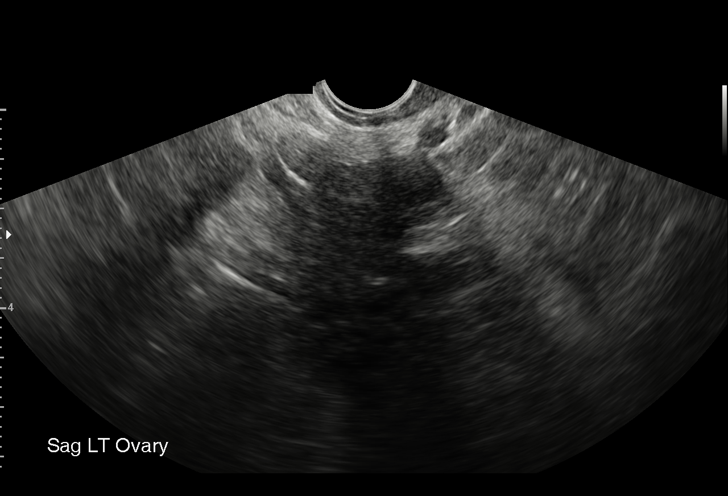
[im 50/59]
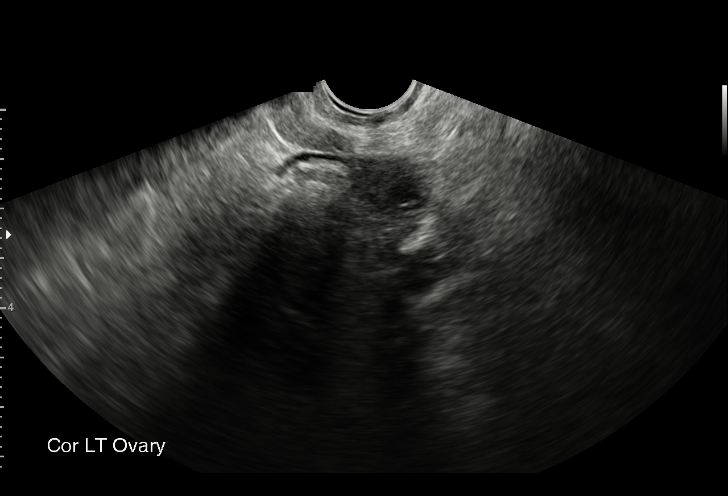
[im 54/59]
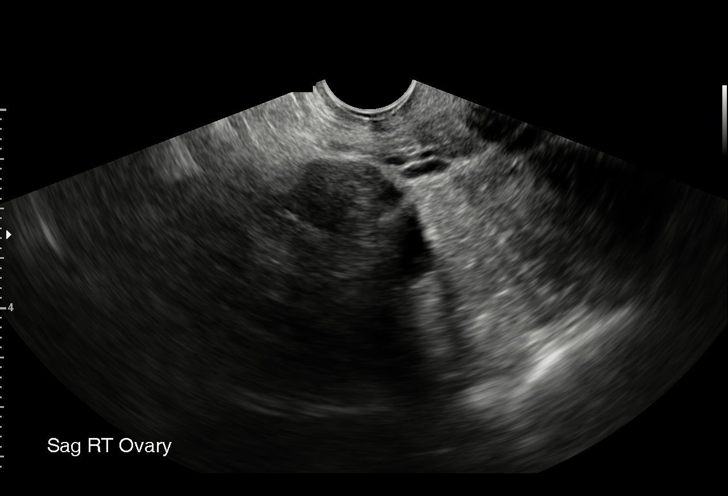
[im 59/59]
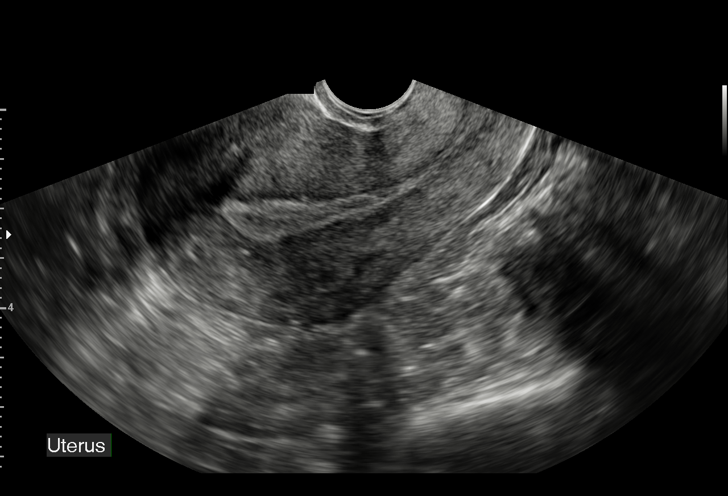

[15 of 28 positions shown; findings below may reference images not displayed]

FINDINGS: Intrauterine gestational sac: None

Maternal uterus/adnexae: The endometrial stripe thickness is
mm. Two fibromas are identified measuring 11 and 10 mm at the fundus
and body respectively. No free fluid. The ovaries are unremarkable.
IMPRESSION: 1. No IUP identified. In the setting of pregnancy, this could
represent early pregnancy, recent miscarriage, or ectopic pregnancy.
Recommend clinical correlation and close follow-up.
2. Two small fibroids in the uterus.

## 2019-03-09 ENCOUNTER — Encounter: Payer: Self-pay | Admitting: Family Medicine

## 2019-03-10 ENCOUNTER — Other Ambulatory Visit: Payer: Self-pay | Admitting: Family Medicine

## 2019-03-10 MED ORDER — FLUOXETINE HCL 10 MG PO CAPS: ORAL_CAPSULE | ORAL | 3 refills | Status: DC

## 2019-03-11 ENCOUNTER — Other Ambulatory Visit: Payer: Self-pay | Admitting: Family Medicine

## 2019-04-15 ENCOUNTER — Encounter: Payer: Self-pay | Admitting: Family Medicine

## 2019-05-03 ENCOUNTER — Encounter: Payer: Self-pay | Admitting: Family Medicine

## 2019-05-04 ENCOUNTER — Other Ambulatory Visit: Payer: Self-pay

## 2019-05-05 ENCOUNTER — Ambulatory Visit (INDEPENDENT_AMBULATORY_CARE_PROVIDER_SITE_OTHER): Payer: BLUE CROSS/BLUE SHIELD | Admitting: Family Medicine

## 2019-05-05 ENCOUNTER — Ambulatory Visit: Payer: BLUE CROSS/BLUE SHIELD | Admitting: Family Medicine

## 2019-05-05 DIAGNOSIS — J449 Chronic obstructive pulmonary disease, unspecified: Secondary | ICD-10-CM

## 2019-05-05 DIAGNOSIS — Z7189 Other specified counseling: Secondary | ICD-10-CM

## 2019-05-05 NOTE — Progress Notes (Signed)
Virtual Visit via Telephone Note  I connected with Andrea [name] on 05/06/19 at 2:46pm by telephone and verified that I am speaking with the correct person using two identifiers.         Pt location: at home   Physician location:  In office, [facility], Vic Blackbird MD     On call: patient and physician   I discussed the limitations, risks, security and privacy concerns of performing an evaluation and management service by telephone and the availability of in person appointments. I also discussed with the patient that there may be a patient responsible charge related to this service. The patient expressed understanding and agreed to proceed.   History of Present Illness:   She has been working from home for the past 4 months due to high risk for complications of [id number].  COPD/DM  HR needs formal accommodations.   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 The patient was provided an opportunity to ask questions and all were answered. The patient agreed with the plan and demonstrated an understanding of the instructions.   The patient was advised to call back or seek an in-person evaluation if the symptoms worsen or if the condition fails to improve as  anticipated.  I provided  48minutes of non-face-to-face time during this encounter. End Time 2:58  Vic Blackbird, MD

## 2019-05-06 ENCOUNTER — Encounter: Payer: Self-pay | Admitting: Family Medicine

## 2019-05-14 ENCOUNTER — Encounter: Payer: Self-pay | Admitting: Family Medicine

## 2019-05-17 ENCOUNTER — Telehealth: Payer: Self-pay | Admitting: Family Medicine

## 2019-05-17 NOTE — Telephone Encounter (Signed)
Received STD forms from [facility].   Contacted patient to obtain more information.

## 2019-05-17 NOTE — Telephone Encounter (Signed)
Received forms via fax for this patient  Will route to christina for completion

## 2019-05-17 NOTE — Telephone Encounter (Signed)
I  Will complete for next 60 days, then she will need OV to re assess  Please get correct start date for short term disability

## 2019-05-17 NOTE — Telephone Encounter (Signed)
Call placed to patient. [place].   Sent MyChart message with inquiries.

## 2019-05-17 NOTE — Telephone Encounter (Signed)
Received following message from patient MyChart: I am requesting short term disability because I am high risk and my manager isn't allowing me to continue to work from home anymore. Thank you!!  Forms routed to provider.

## 2019-05-17 NOTE — Telephone Encounter (Signed)
Awaiting forms

## 2019-05-26 ENCOUNTER — Encounter: Payer: Self-pay | Admitting: Family Medicine

## 2019-05-27 ENCOUNTER — Encounter: Payer: Self-pay | Admitting: Family Medicine

## 2019-05-28 ENCOUNTER — Telehealth: Payer: Self-pay | Admitting: Family Medicine

## 2019-05-28 NOTE — Telephone Encounter (Signed)
I left a message for Dr. Terri Piedra patient is assigned to at [facility] for her pregnancy.  Need to know who will be treating her diabetes during the pregnancy.  I have already advised the patient to stop taking the metformin as this is making her sick.

## 2019-06-04 ENCOUNTER — Encounter: Payer: Self-pay | Admitting: Family Medicine

## 2019-06-07 ENCOUNTER — Encounter: Payer: Self-pay | Admitting: Family Medicine

## 2019-06-09 ENCOUNTER — Other Ambulatory Visit: Payer: Self-pay

## 2019-06-10 ENCOUNTER — Encounter: Payer: Self-pay | Admitting: Dietician

## 2019-06-10 ENCOUNTER — Ambulatory Visit (INDEPENDENT_AMBULATORY_CARE_PROVIDER_SITE_OTHER): Payer: BLUE CROSS/BLUE SHIELD | Admitting: *Deleted

## 2019-06-10 ENCOUNTER — Encounter: Payer: BLUE CROSS/BLUE SHIELD | Attending: Obstetrics and Gynecology | Admitting: Dietician

## 2019-06-10 DIAGNOSIS — Z23 Encounter for immunization: Secondary | ICD-10-CM | POA: Diagnosis not present

## 2019-06-10 DIAGNOSIS — O24111 Pre-existing diabetes mellitus, type 2, in pregnancy, first trimester: Secondary | ICD-10-CM | POA: Diagnosis not present

## 2019-06-10 NOTE — Progress Notes (Signed)
Patient seen in office for Influenza Vaccination.   Tolerated IM administration well.   Immunization history updated.  

## 2019-06-10 NOTE — Patient Instructions (Addendum)
Aim to be active at least 30 minutes most days. Continue to be mindful about your beverages. Continue 3 meals per day plus 2-3 snacks. Choose about 30-45 grams of carbohydrate with each meal and about 15 grams of carbs with each snack.   Choose a small amount of protein with each meal and snack.  Begin monitoring your blood sugar 4 times per day.  Before breakfast and 2 hours after each meal. Consider calling your insurance company and asking about the [employer].  Continue to take your medication daily.  Please call for any questions!

## 2019-06-10 NOTE — Progress Notes (Signed)
Medical Nutrition Therapy:  Appt start time: 1450 end time:  1600.   Assessment:  Primary concerns today:  Patient is here today alone.  She is [redacted] weeks gestation and has a history of type 2 diabetes for the past 2 years and mild depression/anxiety, HLD and kidney stones. Her last A1C was 7.5% 05/21/19. She states that she expects her next A1C to be improved as she has begun taking her medication more regularly and is eating better. She is not checking her BG as she has a hard time checking her BG.  Her insurance company denied her the Dexcom CGM.  Reported weight 192 lbs which she reports is 2 lbs less than 2 weeks ago.  Some problems with nausea still.  Patient lives with her husband and 40 yo daughter.  Her husband does the shopping and cooking.  She does not like vegetables but reports willingness to try in different ways.  She states that her husband is very supportive and encourages her to eat more healthfully and prepares brown rice and other healthier foods for her.  She has been out of work for the last 6 months and will go back Monday.  She works in [employer] as a [employer].  Preferred Learning Style:   No preference indicated   Learning Readiness:   Ready  Change in progress   MEDICATIONS: see list to include Metformin and prenatal vitamin   DIETARY INTAKE:  Usual eating pattern includes 3 meals and 2 snacks per day. Avoided foods include decreased amounts of vegetables as she dislikes..    24-hr recall:  B ( AM): eggs, occasional ham and cheese  Snk ( AM): nuts  L ( PM): Herbal Life shake Snk ( PM): cottage cheese and fruit or cheese stick and fruit D ( PM): brown rice, black beans, chicken OR pasta, meat sauce OR salad with chicken Snk ( PM): occasional popcorn Beverages: decaf coffee with SF creamer, water, crystal lite, 1 Diet Mt. Dew daily  Usual physical activity: treadmill at home, bikes in neighborhood, walks (3-4 times per week for 10-30 minutes each  time)   Progress Towards Goal(s):  In progress.   Nutritional Diagnosis:  NB-1.1 Food and nutrition-related knowledge deficit As related to balance of carbohydrate, protein, and fat.  As evidenced by diet hx and patient report.    Intervention:  Nutrition education related to type 2 diabetes during pregnancy.  Discussed carbohydrate sources, portion size and recommended amounts of 175 grams spread throughout the day.  Discussed protein and balance with carbohydrate.  Discussed insulin resistance and importance of metformin, exercise, low saturated fat diet, and increased vegetables.  Discussed basic meal planning.  Discussed BG monitoring frequency, technique, and options such as the [employer] Libre.  Discussed BG goals.  Plan: Aim to be active at least 30 minutes most days. Continue to be mindful about your beverages. Continue 3 meals per day plus 2-3 snacks. Choose about 30-45 grams of carbohydrate with each meal and about 15 grams of carbs with each snack.   Choose a small amount of protein with each meal and snack. Begin monitoring your blood sugar 4 times per day.  Before breakfast and 2 hours after each meal. Consider calling your insurance company and asking about the [employer] Libre. Continue to take your medication daily. Please call for any questions!  Teaching Method Utilized:  Visual Auditory Hands on  Handouts given during visit include: How to Thrive:  A Guide for Your Journey with Diabetes by the  ADA Snack list Meal Plan Card  Nutrition, Diabetes, and Pregnancy  Barriers to learning/adherence to lifestyle change: preferences  Demonstrated degree of understanding via:  Teach Back   Monitoring/Evaluation:  Dietary intake, exercise, label reading, and body weight prn.

## 2019-06-11 ENCOUNTER — Ambulatory Visit: Payer: BLUE CROSS/BLUE SHIELD | Admitting: Family Medicine

## 2019-06-11 LAB — OB RESULTS CONSOLE HIV ANTIBODY (ROUTINE TESTING): HIV: NONREACTIVE

## 2019-06-11 LAB — OB RESULTS CONSOLE ABO/RH: RH Type: POSITIVE

## 2019-06-11 LAB — OB RESULTS CONSOLE RPR: RPR: NONREACTIVE

## 2019-06-11 LAB — OB RESULTS CONSOLE RUBELLA ANTIBODY, IGM: Rubella: IMMUNE

## 2019-06-11 LAB — OB RESULTS CONSOLE GC/CHLAMYDIA
Chlamydia: NEGATIVE
Gonorrhea: NEGATIVE

## 2019-06-11 LAB — OB RESULTS CONSOLE ANTIBODY SCREEN: Antibody Screen: NEGATIVE

## 2019-06-11 LAB — OB RESULTS CONSOLE HEPATITIS B SURFACE ANTIGEN: Hepatitis B Surface Ag: NEGATIVE

## 2019-06-24 ENCOUNTER — Encounter: Payer: Self-pay | Admitting: Family Medicine

## 2019-06-29 ENCOUNTER — Ambulatory Visit: Payer: Self-pay | Admitting: Family Medicine

## 2019-07-27 ENCOUNTER — Encounter: Payer: Self-pay | Admitting: Family Medicine

## 2019-09-02 ENCOUNTER — Encounter: Payer: Self-pay | Admitting: Family Medicine

## 2019-09-03 MED ORDER — FREESTYLE LIBRE 14 DAY SENSOR MISC: 1.0000 | 11 refills | Status: DC

## 2019-09-16 DIAGNOSIS — O24119 Pre-existing diabetes mellitus, type 2, in pregnancy, unspecified trimester: Secondary | ICD-10-CM | POA: Insufficient documentation

## 2019-09-24 ENCOUNTER — Other Ambulatory Visit: Payer: BLUE CROSS/BLUE SHIELD

## 2019-09-27 ENCOUNTER — Encounter: Payer: Self-pay | Admitting: Family Medicine

## 2019-09-27 LAB — SARS-COV-2 RNA,(COVID-19) QUALITATIVE NAAT: SARS CoV2 RNA: NOT DETECTED

## 2019-10-01 ENCOUNTER — Encounter: Payer: Self-pay | Admitting: Family Medicine

## 2019-11-17 ENCOUNTER — Encounter: Payer: Self-pay | Admitting: Family Medicine

## 2019-11-17 NOTE — Telephone Encounter (Signed)
   Okay to schedule newborn

## 2019-11-29 ENCOUNTER — Inpatient Hospital Stay (HOSPITAL_COMMUNITY)
Admission: AD | Admit: 2019-11-29 | Discharge: 2019-11-29 | Disposition: A | Discharge status: Home / Self Care | Payer: 59 | Attending: Obstetrics and Gynecology | Admitting: Obstetrics and Gynecology

## 2019-11-29 ENCOUNTER — Encounter (HOSPITAL_COMMUNITY): Payer: Self-pay | Admitting: Obstetrics and Gynecology

## 2019-11-29 ENCOUNTER — Other Ambulatory Visit: Payer: Self-pay

## 2019-11-29 DIAGNOSIS — Z3689 Encounter for other specified antenatal screening: Secondary | ICD-10-CM | POA: Diagnosis not present

## 2019-11-29 DIAGNOSIS — F319 Bipolar disorder, unspecified: Secondary | ICD-10-CM | POA: Insufficient documentation

## 2019-11-29 DIAGNOSIS — F419 Anxiety disorder, unspecified: Secondary | ICD-10-CM | POA: Insufficient documentation

## 2019-11-29 DIAGNOSIS — O99343 Other mental disorders complicating pregnancy, third trimester: Secondary | ICD-10-CM | POA: Insufficient documentation

## 2019-11-29 DIAGNOSIS — Z3A33 33 weeks gestation of pregnancy: Secondary | ICD-10-CM | POA: Diagnosis not present

## 2019-11-29 DIAGNOSIS — Z8249 Family history of ischemic heart disease and other diseases of the circulatory system: Secondary | ICD-10-CM | POA: Insufficient documentation

## 2019-11-29 DIAGNOSIS — Z794 Long term (current) use of insulin: Secondary | ICD-10-CM | POA: Insufficient documentation

## 2019-11-29 DIAGNOSIS — O24113 Pre-existing diabetes mellitus, type 2, in pregnancy, third trimester: Secondary | ICD-10-CM | POA: Insufficient documentation

## 2019-11-29 DIAGNOSIS — O163 Unspecified maternal hypertension, third trimester: Secondary | ICD-10-CM | POA: Insufficient documentation

## 2019-11-29 DIAGNOSIS — Z818 Family history of other mental and behavioral disorders: Secondary | ICD-10-CM | POA: Insufficient documentation

## 2019-11-29 DIAGNOSIS — E119 Type 2 diabetes mellitus without complications: Secondary | ICD-10-CM | POA: Insufficient documentation

## 2019-11-29 DIAGNOSIS — F17210 Nicotine dependence, cigarettes, uncomplicated: Secondary | ICD-10-CM | POA: Insufficient documentation

## 2019-11-29 DIAGNOSIS — O99333 Smoking (tobacco) complicating pregnancy, third trimester: Secondary | ICD-10-CM | POA: Insufficient documentation

## 2019-11-29 NOTE — MAU Note (Signed)
Andrea [name] is a 39 y.o. at [redacted]w[redacted]d here in MAU reporting: states she failed her NST in the office so they sent her over here for more monitoring. +FM. States she has a headache- took some tylenol, no other pain.  Onset of complaint: today  Pain score: 2/10  Vitals:   11/29/19 1804  BP: 116/74  Pulse: (!) 105  Resp: 16  Temp: 98.6 F (37 C)  SpO2: 96%     FHT: +FM  Lab orders placed from triage: UA

## 2019-11-29 NOTE — MAU Provider Note (Signed)
History   [id number]   Chief Complaint  Patient presents with  . Monitoring    HPI Andrea [name] is a 39 y.o. female  G3P2002 @33 .5 wks sent from office for NRNST. She reports good fetal movement. Denies VB, LOF, and ctx. Her pregnancy is complicated by AMA, T2DM, and bipolar. All other systems negative.    No LMP recorded. Patient is pregnant.  OB History  Gravida Para Term Preterm AB Living  3 2 2     2   SAB TAB Ectopic Multiple Live Births          2    # Outcome Date GA Lbr Len/2nd Weight Sex Delivery Anes PTL Lv  3 Current           2 Term 2010     Vag-Spont   LIV  1 Term 1999     Vag-Spont   LIV    Past Medical History:  Diagnosis Date  . Abdominal pain   . Anxiety   . Diabetes mellitus without complication (HCC)    type 2   . High cholesterol   . Hyperlipidemia   . Hypertension   . Kidney stones     Family History  Problem Relation Age of Onset  . Heart attack Mother   . Early death Mother   . Depression Mother   . Heart disease Mother   . Hyperlipidemia Mother   . Hearing loss Father   . Heart disease Maternal Uncle   . Heart attack Maternal Uncle   . Heart attack Paternal Aunt        During a stress test  . Heart attack Paternal Uncle   . Heart attack Paternal Grandfather   . Early death Paternal Grandfather   . Heart disease Paternal Grandfather   . Hyperlipidemia Paternal Grandfather   . Heart disease Maternal Grandmother   . Miscarriages / Stillbirths Paternal Grandmother   . Heart disease Paternal Grandmother     Social History   Socioeconomic History  . Marital status: Married    Spouse name: "Kwesi"  . Number of children: 2  . Years of education: 12+  . Highest education level: Not on file  Occupational History  . Occupation:    Comment: job is primarily 2011  Tobacco Use  . Smoking status: Current Some Day Smoker    Packs/day: 1.00    Years: 19.00    Pack years: 19.00   Types: Cigarettes  . Smokeless tobacco: Never Used  Substance and Sexual Activity  . Alcohol use: Not Currently    Alcohol/week: 0.0 standard drinks    Comment: occ  . Drug use: No  . Sexual activity: Yes    Partners: Male    Birth control/protection: None  Other Topics Concern  . Not on file  Social History Narrative   Trained as a Administrator, Civil Service.   Lives with her husband and both their children.   Social Determinants of Health   Financial Resource Strain:   . Difficulty of Paying Living Expenses: Not on file  Food Insecurity:   . Worried About Designer, industrial/product in the Last Year: Not on file  . Ran Out of Food in the Last Year: Not on file  Transportation Needs:   . Lack of Transportation (Medical): Not on file  . Lack of Transportation (Non-Medical): Not on file  Physical Activity:   . Days of Exercise per Week: Not on file  . Minutes of  Exercise per Session: Not on file  Stress:   . Feeling of Stress : Not on file  Social Connections:   . Frequency of Communication with Friends and Family: Not on file  . Frequency of Social Gatherings with Friends and Family: Not on file  . Attends Religious Services: Not on file  . Active Member of Clubs or Organizations: Not on file  . Attends Archivist Meetings: Not on file  . Marital Status: Not on file    No Known Allergies  No current facility-administered medications on file prior to encounter.   Current Outpatient Medications on File Prior to Encounter  Medication Sig Dispense Refill  . Continuous Blood Gluc Sensor (FREESTYLE LIBRE 14 DAY SENSOR) MISC 1 each by Does not apply route every 14 (fourteen) days. 2 each 11  . folic acid (FOLVITE) 1 MG tablet Take 1 mg by mouth daily.    . insulin aspart (NOVOLOG) 100 UNIT/ML injection Inject into the skin 3 (three) times daily before meals.    . insulin detemir (LEVEMIR) 100 UNIT/ML injection Inject into the skin daily.    . metFORMIN (GLUCOPHAGE) 500 MG tablet TAKE  1 TABLET BY MOUTH DAILY WITH BREAKFAST 90 tablet 1  . Prenatal Vit-Fe Fumarate-FA (MULTIVITAMIN-PRENATAL) 27-0.8 MG TABS tablet Take 1 tablet by mouth daily at 12 noon.    Marland Kitchen albuterol (PROVENTIL HFA;VENTOLIN HFA) 108 (90 Base) MCG/ACT inhaler Inhale 2 puffs into the lungs every 4 (four) hours as needed for wheezing or shortness of breath. (Patient not taking: Reported on 06/10/2019) 1 Inhaler 0  . ALPRAZolam (XANAX) 0.5 MG tablet Take 1 tablet (0.5 mg total) by mouth 2 (two) times daily as needed for anxiety. (Patient not taking: Reported on 06/10/2019) 60 tablet 1  . budesonide-formoterol (SYMBICORT) 80-4.5 MCG/ACT inhaler Inhale 2 puffs into the lungs 2 (two) times daily. (Patient not taking: Reported on 06/10/2019) 1 Inhaler 6  . FLUoxetine (PROZAC) 10 MG capsule TAKE 1 CAPSULE BY MOUTH EVERY DAY (Patient not taking: Reported on 06/10/2019) 30 capsule 3     Review of Systems  Gastrointestinal: Negative for abdominal pain.  Genitourinary: Negative for vaginal bleeding and vaginal discharge.   Physical Exam   Vitals:   11/29/19 1801 11/29/19 1804  BP:  116/74  Pulse:  (!) 105  Resp:  16  Temp:  98.6 F (37 C)  TempSrc:  Oral  SpO2:  96%  Weight: 88.7 kg   Height: 5\' 1"  (1.549 m)    Physical Exam  Nursing note and vitals reviewed. Constitutional: She is oriented to person, place, and time. She appears well-developed and well-nourished. No distress.  HENT:  Head: Normocephalic and atraumatic.  Respiratory: Effort normal. No respiratory distress.  Musculoskeletal:        General: Normal range of motion.     Cervical back: Normal range of motion.  Neurological: She is alert and oriented to person, place, and time.  Psychiatric: She has a normal mood and affect.  EFM: 150 bpm, mod variability, + accels, no decels Toco: none  No results found for this or any previous visit (from the past 24 hour(s)).  MAU Course  Procedures Prolonged EFM  MDM NST reactive, pt reassured. Stable  for discharge home.   Assessment and Plan   1. [redacted] weeks gestation of pregnancy   2. NST (non-stress test) reactive    Discharge home [facility]  Allergies as of 11/29/2019   No Known Allergies  Medication List    STOP taking these medications   ALPRAZolam 0.5 MG tablet Commonly known as: Xanax   FLUoxetine 10 MG capsule Commonly known as: PROZAC     TAKE these medications   albuterol 108 (90 Base) MCG/ACT inhaler Commonly known as: VENTOLIN HFA Inhale 2 puffs into the lungs every 4 (four) hours as needed for wheezing or shortness of breath.   budesonide-formoterol 80-4.5 MCG/ACT inhaler Commonly known as: SYMBICORT Inhale 2 puffs into the lungs 2 (two) times daily.   folic acid 1 MG tablet Commonly known as: FOLVITE Take 1 mg by mouth daily.   FreeStyle Libre 14 Day Sensor Misc 1 each by Does not apply route every 14 (fourteen) days.   insulin aspart 100 UNIT/ML injection Commonly known as: novoLOG Inject into the skin 3 (three) times daily before meals.   insulin detemir 100 UNIT/ML injection Commonly known as: LEVEMIR Inject into the skin daily.   metFORMIN 500 MG tablet Commonly known as: GLUCOPHAGE TAKE 1 TABLET BY MOUTH DAILY WITH BREAKFAST   multivitamin-prenatal 27-0.8 MG Tabs tablet Take 1 tablet by mouth daily at 12 noon.       Donette Larry, CNM 11/29/2019 6:59 PM

## 2019-11-29 NOTE — Discharge Instructions (Signed)
Fetal Movement Counts Patient Name: ________________________________________________ Patient Due Date: ____________________ What is a fetal movement count?  A fetal movement count is the number of times that you feel your baby move during a certain amount of time. This may also be called a fetal kick count. A fetal movement count is recommended for every pregnant woman. You may be asked to start counting fetal movements as early as week 28 of your pregnancy. Pay attention to when your baby is most active. You may notice your baby's sleep and wake cycles. You may also notice things that make your baby move more. You should do a fetal movement count:  When your baby is normally most active.  At the same time each day. A good time to count movements is while you are resting, after having something to eat and drink. How do I count fetal movements? 1. Find a quiet, comfortable area. Sit, or lie down on your side. 2. Write down the date, the start time and stop time, and the number of movements that you felt between those two times. Take this information with you to your health care visits. 3. Write down your start time when you feel the first movement. 4. Count kicks, flutters, swishes, rolls, and jabs. You should feel at least 10 movements. 5. You may stop counting after you have felt 10 movements, or if you have been counting for 2 hours. Write down the stop time. 6. If you do not feel 10 movements in 2 hours, contact your health care provider for further instructions. Your health care provider may want to do additional tests to assess your baby's well-being. Contact a health care provider if:  You feel fewer than 10 movements in 2 hours.  Your baby is not moving like he or she usually does. Date: ____________ Start time: ____________ Stop time: ____________ Movements: ____________ Date: ____________ Start time: ____________ Stop time: ____________ Movements: ____________ Date: ____________  Start time: ____________ Stop time: ____________ Movements: ____________ Date: ____________ Start time: ____________ Stop time: ____________ Movements: ____________ Date: ____________ Start time: ____________ Stop time: ____________ Movements: ____________ Date: ____________ Start time: ____________ Stop time: ____________ Movements: ____________ Date: ____________ Start time: ____________ Stop time: ____________ Movements: ____________ Date: ____________ Start time: ____________ Stop time: ____________ Movements: ____________ Date: ____________ Start time: ____________ Stop time: ____________ Movements: ____________ This information is not intended to replace advice given to you by your health care provider. Make sure you discuss any questions you have with your health care provider. Document Revised: 05/20/2019 Document Reviewed: 05/20/2019 Elsevier Patient Education  2020 [employer]  

## 2019-12-15 ENCOUNTER — Encounter (HOSPITAL_COMMUNITY): Payer: Self-pay

## 2019-12-15 NOTE — H&P (Signed)
Andrea [name] is a 39 y.[id number]  female presenting at 41 weeks for scheduled primary cesarean section. Pt requested prime c/s due to history of shoulder dystocia with both previous deliveries. Both babies were 9+ lbs with current pregnancy noted to be AGA ( 68%ile) however pt did not want to take the risk.  She is dated per LMP which was confirmed via 9week [place]. She is type 2 diabetic and was managed on metformin and insulin through pregnancy by Dr. Talmage Nap. Pt has a history of bipolar disorder/anxiety.depression - no meds during pregnancy. She had a low lying placenta that resolved. She is AMA - panorama screen was normal. Fetal echo was normal at 20 weeks OB History    Gravida  3   Para  2   Term  2   Preterm      AB      Living  2     SAB      TAB      Ectopic      Multiple      Live Births  2          Past Medical History:  Diagnosis Date  . Abdominal pain   . Anxiety   . Bipolar disorder (HCC)   . Diabetes mellitus without complication (HCC)    type 2   . Headache   . High cholesterol   . Hyperlipidemia   . Hypertension   . Kidney stones    Past Surgical History:  Procedure Laterality Date  . CHOLECYSTECTOMY  08/05/11  . KIDNEY STONE SURGERY     Family History: family history includes Depression in her mother; Early death in her mother and paternal grandfather; Hearing loss in her father; Heart attack in her maternal uncle, mother, paternal aunt, paternal grandfather, and paternal uncle; Heart disease in her maternal grandmother, maternal uncle, mother, paternal grandfather, and paternal grandmother; Hyperlipidemia in her mother and paternal grandfather; Miscarriages / [place] in her paternal grandmother. Social History:  reports that she has been smoking cigarettes. She has a 19.00 pack-year smoking history. She has never used smokeless tobacco. She reports previous alcohol use. She reports that she does not use drugs.     Maternal Diabetes: Yes:   Diabetes Type:  Pre-pregnancy Genetic Screening: Normal Maternal Ultrasounds/Referrals: Normal Fetal Ultrasounds or other Referrals:  Fetal echo Maternal Substance Abuse:  No Significant Maternal Medications:  Meds include: Other: see HPI Significant Maternal Lab Results:  Other: GBS pending Other Comments:  None  Review of Systems  Constitutional: Negative for activity change and appetite change.  Eyes: Negative for visual disturbance.  Respiratory: Negative for chest tightness and shortness of breath.   Cardiovascular: Positive for leg swelling. Negative for palpitations.  Gastrointestinal: Negative for abdominal pain.  Genitourinary: Negative for pelvic pain.  Musculoskeletal: Positive for back pain.  Neurological: Negative for headaches.  Psychiatric/Behavioral: The patient is nervous/anxious.    Maternal Medical History:  Reason for admission: Scheduled primary cesarean section  Fetal activity: Perceived fetal activity is normal.   Last perceived fetal movement was within the past hour.    Prenatal Complications - Diabetes: type 2. Diabetes is managed by insulin injections and oral agent (dual therapy).        There were no vitals taken for this visit. Maternal Exam:  Uterine Assessment: Contraction frequency is rare.   Abdomen: Patient reports generalized tenderness.  Estimated fetal weight is AGA.   Fetal presentation: vertex  Introitus: Vulva is positive for vulval condylomata. Normal vagina.  Vagina is negative for condylomata.  Pelvis: of concern for delivery.   Cervix: Cervix evaluated by digital exam.     Physical Exam  Constitutional: She is oriented to person, place, and time. She appears well-developed and well-nourished.  Cardiovascular: Intact distal pulses.  Respiratory: Effort normal.  GI: Soft. There is generalized abdominal tenderness.  Genitourinary:    Vagina and uterus normal.     Vulval condylomata present.   Musculoskeletal:         General: Normal range of motion.     Cervical back: Normal range of motion.  Neurological: She is alert and oriented to person, place, and time.  Skin: Skin is warm.  Psychiatric: She has a normal mood and affect. Her behavior is normal. Judgment and thought content normal.    Prenatal labs: ABO, Rh: B/Positive/-- (08/28 0000) Antibody: Negative (08/28 0000) Rubella: Immune (08/28 0000) RPR: Nonreactive (08/28 0000)  HBsAg: Negative (08/28 0000)  HIV: Non-reactive (08/28 0000)  GBS:     Assessment/Plan: 39yo G [id number] female with type 2 DM here for scheduled primary cesarean section - per request -Admit -NPO per ERAS -Spinal per anesthesia -Ancef preop -Verify consent - To OR when ready  Andrea [name] 12/15/2019, 11:59 PM

## 2019-12-16 ENCOUNTER — Encounter (HOSPITAL_COMMUNITY): Payer: Self-pay

## 2019-12-16 NOTE — Patient Instructions (Signed)
Kathe A Gebbia  12/16/2019   Your procedure is scheduled on:  12/28/2019  Arrive at 0800 at [employer] [facility] on [address] at [facility]  and [employer]. You are invited to use the FREE valet parking or use the Visitor's parking deck.  Pick up the phone at the desk and dial [phone].  Call this number if you have problems the morning of surgery: [phone]  Remember:   Do not eat food:(After Midnight) Desps de medianoche.  Do not drink clear liquids: (After Midnight) Desps de medianoche.  Take these medicines the morning of surgery with A SIP OF WATER:  No insulin the day of surgery.  NO metformin the night before or the day of surgery   Do not wear jewelry, make-up or nail polish.  Do not wear lotions, powders, or perfumes. Do not wear deodorant.  Do not shave 48 hours prior to surgery.  Do not bring valuables to the hospital.  [facility] is not   responsible for any belongings or valuables brought to the hospital.  Contacts, dentures or bridgework may not be worn into surgery.  Leave suitcase in the car. After surgery it may be brought to your room.  For patients admitted to the hospital, checkout time is 11:00 AM the day of              discharge.      Please read over the following fact sheets that you were given:     Preparing for Surgery

## 2019-12-26 ENCOUNTER — Other Ambulatory Visit (HOSPITAL_COMMUNITY)
Admission: RE | Admit: 2019-12-26 | Discharge: 2019-12-26 | Disposition: A | Discharge status: Home / Self Care | Payer: 59 | Source: Ambulatory Visit | Attending: Obstetrics and Gynecology | Admitting: Obstetrics and Gynecology

## 2019-12-26 ENCOUNTER — Other Ambulatory Visit: Payer: Self-pay

## 2019-12-26 HISTORY — DX: Headache, unspecified: R51.9

## 2019-12-26 HISTORY — DX: Bipolar disorder, unspecified: F31.9

## 2019-12-26 LAB — COMPREHENSIVE METABOLIC PANEL
ALT: 18 U/L (ref 0–44)
AST: 17 U/L (ref 15–41)
Albumin: 2.9 g/dL — ABNORMAL LOW (ref 3.5–5.0)
Alkaline Phosphatase: 134 U/L — ABNORMAL HIGH (ref 38–126)
Anion gap: 14 (ref 5–15)
BUN: 12 mg/dL (ref 6–20)
CO2: 20 mmol/L — ABNORMAL LOW (ref 22–32)
Calcium: 9.6 mg/dL (ref 8.9–10.3)
Chloride: 103 mmol/L (ref 98–111)
Creatinine, Ser: 0.63 mg/dL (ref 0.44–1.00)
GFR calc Af Amer: >60 mL/min (ref >60)
GFR calc non Af Amer: >60 mL/min (ref >60)
Glucose, Bld: 135 mg/dL — ABNORMAL HIGH (ref 70–99)
Potassium: 3.8 mmol/L (ref 3.5–5.1)
Sodium: 137 mmol/L (ref 135–145)
Total Bilirubin: 0.5 mg/dL (ref 0.3–1.2)
Total Protein: 6.3 g/dL — ABNORMAL LOW (ref 6.5–8.1)

## 2019-12-26 LAB — CBC
HCT: 37.1 % (ref 36.0–46.0)
Hemoglobin: 11.9 g/dL — ABNORMAL LOW (ref 12.0–15.0)
MCH: 28.1 pg (ref 26.0–34.0)
MCHC: 32.1 g/dL (ref 30.0–36.0)
MCV: 87.5 fL (ref 80.0–100.0)
Platelets: 181 10*3/uL (ref 150–400)
RBC: 4.24 MIL/uL (ref 3.87–5.11)
RDW: 13.9 % (ref 11.5–15.5)
WBC: 8.9 10*3/uL (ref 4.0–10.5)
nRBC: 0 % (ref 0.0–0.2)

## 2019-12-26 LAB — SARS CORONAVIRUS 2 (TAT 6-24 HRS): SARS Coronavirus 2: NEGATIVE

## 2019-12-26 LAB — TYPE AND SCREEN
ABO/RH(D): B POS
Antibody Screen: NEGATIVE

## 2019-12-26 LAB — ABO/RH: ABO/RH(D): B POS

## 2019-12-26 NOTE — MAU Note (Signed)
Pt here for PAT covid swab and lab draw. Denies symptoms. Swab collected. 

## 2019-12-27 ENCOUNTER — Encounter (HOSPITAL_COMMUNITY): Payer: Self-pay | Admitting: Obstetrics and Gynecology

## 2019-12-27 LAB — RPR: RPR Ser Ql: NONREACTIVE

## 2019-12-28 ENCOUNTER — Other Ambulatory Visit: Payer: Self-pay

## 2019-12-28 ENCOUNTER — Inpatient Hospital Stay (HOSPITAL_COMMUNITY): Payer: 59

## 2019-12-28 ENCOUNTER — Encounter (HOSPITAL_COMMUNITY): Payer: Self-pay | Admitting: Obstetrics and Gynecology

## 2019-12-28 ENCOUNTER — Inpatient Hospital Stay (HOSPITAL_COMMUNITY)
Admission: RE | Admit: 2019-12-28 | Discharge: 2019-12-30 | DRG: 788 | Disposition: A | Discharge status: Home / Self Care | Payer: 59 | Attending: Obstetrics and Gynecology | Admitting: Obstetrics and Gynecology

## 2019-12-28 ENCOUNTER — Encounter (HOSPITAL_COMMUNITY)
Admission: RE | Disposition: A | Discharge status: Home / Self Care | Payer: Self-pay | Source: Home / Self Care | Attending: Obstetrics and Gynecology

## 2019-12-28 DIAGNOSIS — E119 Type 2 diabetes mellitus without complications: Secondary | ICD-10-CM | POA: Diagnosis present

## 2019-12-28 DIAGNOSIS — Z794 Long term (current) use of insulin: Secondary | ICD-10-CM | POA: Diagnosis not present

## 2019-12-28 DIAGNOSIS — Z3490 Encounter for supervision of normal pregnancy, unspecified, unspecified trimester: Secondary | ICD-10-CM

## 2019-12-28 DIAGNOSIS — F17210 Nicotine dependence, cigarettes, uncomplicated: Secondary | ICD-10-CM | POA: Diagnosis present

## 2019-12-28 DIAGNOSIS — O99334 Smoking (tobacco) complicating childbirth: Secondary | ICD-10-CM | POA: Diagnosis present

## 2019-12-28 DIAGNOSIS — Z3A37 37 weeks gestation of pregnancy: Secondary | ICD-10-CM | POA: Diagnosis not present

## 2019-12-28 DIAGNOSIS — Z20822 Contact with and (suspected) exposure to covid-19: Secondary | ICD-10-CM | POA: Diagnosis present

## 2019-12-28 DIAGNOSIS — O26893 Other specified pregnancy related conditions, third trimester: Secondary | ICD-10-CM | POA: Diagnosis present

## 2019-12-28 DIAGNOSIS — O2412 Pre-existing diabetes mellitus, type 2, in childbirth: Principal | ICD-10-CM | POA: Diagnosis present

## 2019-12-28 DIAGNOSIS — Z98891 History of uterine scar from previous surgery: Secondary | ICD-10-CM

## 2019-12-28 HISTORY — DX: History of uterine scar from previous surgery: Z98.891

## 2019-12-28 LAB — GLUCOSE, CAPILLARY
Glucose-Capillary: 102 mg/dL — ABNORMAL HIGH (ref 70–99)
Glucose-Capillary: 101 mg/dL — ABNORMAL HIGH (ref 70–99)

## 2019-12-28 SURGERY — Surgical Case
Anesthesia: Spinal | Wound class: Clean Contaminated

## 2019-12-28 MED ORDER — NALBUPHINE HCL 10 MG/ML IJ SOLN: 5.0000 mg | INTRAMUSCULAR | Status: DC | PRN

## 2019-12-28 MED ORDER — PRENATAL MULTIVITAMIN CH
1.0000 | ORAL_TABLET | Freq: Every day | ORAL | Status: DC
  Administered 2019-12-29: 1 via ORAL
  Filled 2019-12-28 (×2): qty 1

## 2019-12-28 MED ORDER — OXYTOCIN 40 UNITS IN NORMAL SALINE INFUSION - SIMPLE MED
INTRAVENOUS | Status: AC
  Filled 2019-12-28: qty 1000

## 2019-12-28 MED ORDER — SIMETHICONE 80 MG PO CHEW
80.0000 mg | CHEWABLE_TABLET | ORAL | Status: DC
  Administered 2019-12-29 (×2): 80 mg via ORAL
  Filled 2019-12-28 (×2): qty 1

## 2019-12-28 MED ORDER — METOCLOPRAMIDE HCL 5 MG/ML IJ SOLN
INTRAMUSCULAR | Status: AC
  Filled 2019-12-28: qty 2

## 2019-12-28 MED ORDER — FENTANYL CITRATE (PF) 250 MCG/5ML IJ SOLN
INTRAMUSCULAR | Status: AC
  Filled 2019-12-28: qty 5

## 2019-12-28 MED ORDER — ONDANSETRON HCL 4 MG/2ML IJ SOLN
INTRAMUSCULAR | Status: DC | PRN
  Administered 2019-12-28: 4 mg via INTRAVENOUS

## 2019-12-28 MED ORDER — FENTANYL CITRATE (PF) 100 MCG/2ML IJ SOLN
INTRAMUSCULAR | Status: AC
  Filled 2019-12-28: qty 2

## 2019-12-28 MED ORDER — INSULIN ASPART 100 UNIT/ML ~~LOC~~ SOLN: 14.0000 [IU] | Freq: Three times a day (TID) | SUBCUTANEOUS | Status: DC

## 2019-12-28 MED ORDER — NALOXONE HCL 4 MG/10ML IJ SOLN
1.0000 ug/kg/h | INTRAVENOUS | Status: DC | PRN
  Filled 2019-12-28: qty 5

## 2019-12-28 MED ORDER — CEFAZOLIN SODIUM-DEXTROSE 2-3 GM-%(50ML) IV SOLR
INTRAVENOUS | Status: DC | PRN
  Administered 2019-12-28: 2 g via INTRAVENOUS

## 2019-12-28 MED ORDER — KETOROLAC TROMETHAMINE 30 MG/ML IJ SOLN
INTRAMUSCULAR | Status: AC
  Filled 2019-12-28: qty 1

## 2019-12-28 MED ORDER — INSULIN DETEMIR 100 UNIT/ML ~~LOC~~ SOLN
12.0000 [IU] | SUBCUTANEOUS | Status: DC
  Filled 2019-12-28: qty 0.12

## 2019-12-28 MED ORDER — DIPHENHYDRAMINE HCL 50 MG/ML IJ SOLN: 12.5000 mg | INTRAMUSCULAR | Status: DC | PRN

## 2019-12-28 MED ORDER — FENTANYL CITRATE (PF) 100 MCG/2ML IJ SOLN
INTRAMUSCULAR | Status: DC | PRN
  Administered 2019-12-28: 15 ug via INTRATHECAL

## 2019-12-28 MED ORDER — OXYTOCIN 10 UNIT/ML IJ SOLN
INTRAMUSCULAR | Status: DC | PRN
  Administered 2019-12-28: 40 [IU]

## 2019-12-28 MED ORDER — BUPIVACAINE IN DEXTROSE 0.75-8.25 % IT SOLN
INTRATHECAL | Status: DC | PRN
  Administered 2019-12-28: 1 mL via INTRATHECAL
  Administered 2019-12-28: .5 mL via INTRATHECAL

## 2019-12-28 MED ORDER — MENTHOL 3 MG MT LOZG: 1.0000 | LOZENGE | OROMUCOSAL | Status: DC | PRN

## 2019-12-28 MED ORDER — MEPERIDINE HCL 25 MG/ML IJ SOLN: 6.2500 mg | INTRAMUSCULAR | Status: DC | PRN

## 2019-12-28 MED ORDER — ACETAMINOPHEN 160 MG/5ML PO SOLN: 325.0000 mg | ORAL | Status: DC | PRN

## 2019-12-28 MED ORDER — SIMETHICONE 80 MG PO CHEW: 80.0000 mg | CHEWABLE_TABLET | ORAL | Status: DC | PRN

## 2019-12-28 MED ORDER — MORPHINE SULFATE (PF) 0.5 MG/ML IJ SOLN
INTRAMUSCULAR | Status: AC
  Filled 2019-12-28: qty 10

## 2019-12-28 MED ORDER — ZOLPIDEM TARTRATE 5 MG PO TABS: 5.0000 mg | ORAL_TABLET | Freq: Every evening | ORAL | Status: DC | PRN

## 2019-12-28 MED ORDER — DIPHENHYDRAMINE HCL 25 MG PO CAPS: 25.0000 mg | ORAL_CAPSULE | ORAL | Status: DC | PRN

## 2019-12-28 MED ORDER — ONDANSETRON HCL 4 MG/2ML IJ SOLN: 4.0000 mg | Freq: Three times a day (TID) | INTRAMUSCULAR | Status: DC | PRN

## 2019-12-28 MED ORDER — SCOPOLAMINE 1 MG/3DAYS TD PT72
1.0000 | MEDICATED_PATCH | Freq: Once | TRANSDERMAL | Status: DC
  Filled 2019-12-28: qty 1

## 2019-12-28 MED ORDER — NALOXONE HCL 0.4 MG/ML IJ SOLN: 0.4000 mg | INTRAMUSCULAR | Status: DC | PRN

## 2019-12-28 MED ORDER — SENNOSIDES-DOCUSATE SODIUM 8.6-50 MG PO TABS
2.0000 | ORAL_TABLET | ORAL | Status: DC
  Administered 2019-12-29 (×2): 2 via ORAL
  Filled 2019-12-28 (×2): qty 2

## 2019-12-28 MED ORDER — CEFAZOLIN SODIUM-DEXTROSE 2-4 GM/100ML-% IV SOLN
INTRAVENOUS | Status: AC
  Filled 2019-12-28: qty 100

## 2019-12-28 MED ORDER — LACTATED RINGERS IV SOLN: INTRAVENOUS | Status: DC

## 2019-12-28 MED ORDER — FREESTYLE LIBRE 14 DAY SENSOR MISC: 1.0000 | Status: DC

## 2019-12-28 MED ORDER — LACTATED RINGERS IV SOLN: INTRAVENOUS | Status: DC | PRN

## 2019-12-28 MED ORDER — ONDANSETRON HCL 4 MG/2ML IJ SOLN
INTRAMUSCULAR | Status: AC
  Filled 2019-12-28: qty 2

## 2019-12-28 MED ORDER — ONDANSETRON HCL 4 MG/2ML IJ SOLN: 4.0000 mg | Freq: Once | INTRAMUSCULAR | Status: DC | PRN

## 2019-12-28 MED ORDER — IBUPROFEN 800 MG PO TABS
800.0000 mg | ORAL_TABLET | Freq: Three times a day (TID) | ORAL | Status: DC
  Administered 2019-12-29 – 2019-12-30 (×4): 800 mg via ORAL
  Filled 2019-12-28 (×4): qty 1

## 2019-12-28 MED ORDER — KETOROLAC TROMETHAMINE 30 MG/ML IJ SOLN
30.0000 mg | Freq: Four times a day (QID) | INTRAMUSCULAR | Status: AC | PRN
  Administered 2019-12-28 – 2019-12-29 (×2): 30 mg via INTRAVENOUS
  Filled 2019-12-28 (×2): qty 1

## 2019-12-28 MED ORDER — ACETAMINOPHEN 500 MG PO TABS
1000.0000 mg | ORAL_TABLET | ORAL | Status: AC
  Administered 2019-12-28: 1000 mg via ORAL

## 2019-12-28 MED ORDER — MORPHINE SULFATE (PF) 0.5 MG/ML IJ SOLN
INTRAMUSCULAR | Status: DC | PRN
  Administered 2019-12-28: .15 mg via INTRATHECAL

## 2019-12-28 MED ORDER — ACETAMINOPHEN 325 MG PO TABS: 325.0000 mg | ORAL_TABLET | ORAL | Status: DC | PRN

## 2019-12-28 MED ORDER — PHENYLEPHRINE 40 MCG/ML (10ML) SYRINGE FOR IV PUSH (FOR BLOOD PRESSURE SUPPORT)
PREFILLED_SYRINGE | INTRAVENOUS | Status: DC | PRN
  Administered 2019-12-28: 40 ug via INTRAVENOUS

## 2019-12-28 MED ORDER — DIPHENHYDRAMINE HCL 25 MG PO CAPS: 25.0000 mg | ORAL_CAPSULE | Freq: Four times a day (QID) | ORAL | Status: DC | PRN

## 2019-12-28 MED ORDER — ACETAMINOPHEN 500 MG PO TABS
ORAL_TABLET | ORAL | Status: AC
  Filled 2019-12-28: qty 2

## 2019-12-28 MED ORDER — SODIUM CHLORIDE 0.9 % IR SOLN
Status: DC | PRN
  Administered 2019-12-28: 1000 mL

## 2019-12-28 MED ORDER — PHENYLEPHRINE 40 MCG/ML (10ML) SYRINGE FOR IV PUSH (FOR BLOOD PRESSURE SUPPORT)
PREFILLED_SYRINGE | INTRAVENOUS | Status: AC
  Filled 2019-12-28: qty 10

## 2019-12-28 MED ORDER — ACETAMINOPHEN 325 MG PO TABS
650.0000 mg | ORAL_TABLET | Freq: Four times a day (QID) | ORAL | Status: DC | PRN
  Administered 2019-12-28 – 2019-12-30 (×4): 650 mg via ORAL
  Filled 2019-12-28 (×4): qty 2

## 2019-12-28 MED ORDER — OXYCODONE HCL 5 MG PO TABS
5.0000 mg | ORAL_TABLET | ORAL | Status: DC | PRN
  Administered 2019-12-29: 10 mg via ORAL
  Administered 2019-12-29: 5 mg via ORAL
  Administered 2019-12-29: 10 mg via ORAL
  Administered 2019-12-29 (×2): 5 mg via ORAL
  Administered 2019-12-30 (×2): 10 mg via ORAL
  Filled 2019-12-28: qty 2
  Filled 2019-12-28: qty 1
  Filled 2019-12-28: qty 2
  Filled 2019-12-28 (×2): qty 1
  Filled 2019-12-28 (×2): qty 2

## 2019-12-28 MED ORDER — TRANEXAMIC ACID-NACL 1000-0.7 MG/100ML-% IV SOLN
INTRAVENOUS | Status: AC
  Filled 2019-12-28: qty 100

## 2019-12-28 MED ORDER — KETOROLAC TROMETHAMINE 30 MG/ML IJ SOLN: 30.0000 mg | Freq: Once | INTRAMUSCULAR | Status: DC

## 2019-12-28 MED ORDER — WITCH HAZEL-GLYCERIN EX PADS: 1.0000 "application " | MEDICATED_PAD | CUTANEOUS | Status: DC | PRN

## 2019-12-28 MED ORDER — SIMETHICONE 80 MG PO CHEW
80.0000 mg | CHEWABLE_TABLET | Freq: Three times a day (TID) | ORAL | Status: DC
  Administered 2019-12-29 – 2019-12-30 (×4): 80 mg via ORAL
  Filled 2019-12-28 (×5): qty 1

## 2019-12-28 MED ORDER — SODIUM CHLORIDE 0.9 % IV SOLN: INTRAVENOUS | Status: DC | PRN

## 2019-12-28 MED ORDER — SCOPOLAMINE 1 MG/3DAYS TD PT72
MEDICATED_PATCH | TRANSDERMAL | Status: AC
  Filled 2019-12-28: qty 1

## 2019-12-28 MED ORDER — TETANUS-DIPHTH-ACELL PERTUSSIS 5-2.5-18.5 LF-MCG/0.5 IM SUSP: 0.5000 mL | Freq: Once | INTRAMUSCULAR | Status: DC

## 2019-12-28 MED ORDER — INSULIN ASPART 100 UNIT/ML ~~LOC~~ SOLN
0.0000 [IU] | Freq: Three times a day (TID) | SUBCUTANEOUS | Status: DC
  Administered 2019-12-28: 21:00:00 3 [IU] via SUBCUTANEOUS

## 2019-12-28 MED ORDER — SODIUM CHLORIDE 0.9% FLUSH: 3.0000 mL | INTRAVENOUS | Status: DC | PRN

## 2019-12-28 MED ORDER — DIBUCAINE (PERIANAL) 1 % EX OINT: 1.0000 "application " | TOPICAL_OINTMENT | CUTANEOUS | Status: DC | PRN

## 2019-12-28 MED ORDER — FENTANYL CITRATE (PF) 100 MCG/2ML IJ SOLN: 25.0000 ug | INTRAMUSCULAR | Status: DC | PRN

## 2019-12-28 MED ORDER — INSULIN ASPART 100 UNIT/ML ~~LOC~~ SOLN: 0.0000 [IU] | Freq: Three times a day (TID) | SUBCUTANEOUS | Status: DC

## 2019-12-28 MED ORDER — DEXAMETHASONE SODIUM PHOSPHATE 4 MG/ML IJ SOLN
INTRAMUSCULAR | Status: DC | PRN
  Administered 2019-12-28: 10 mg via INTRAVENOUS

## 2019-12-28 MED ORDER — PHENYLEPHRINE HCL-NACL 20-0.9 MG/250ML-% IV SOLN
INTRAVENOUS | Status: DC | PRN
  Administered 2019-12-28: 40 ug/min via INTRAVENOUS
  Administered 2019-12-28: 60 ug/min via INTRAVENOUS

## 2019-12-28 MED ORDER — COCONUT OIL OIL
1.0000 "application " | TOPICAL_OIL | Status: DC | PRN
  Administered 2019-12-29: 1 via TOPICAL

## 2019-12-28 MED ORDER — OXYCODONE HCL 5 MG PO TABS: 5.0000 mg | ORAL_TABLET | Freq: Once | ORAL | Status: DC | PRN

## 2019-12-28 MED ORDER — CEFAZOLIN SODIUM-DEXTROSE 2-4 GM/100ML-% IV SOLN: 2.0000 g | INTRAVENOUS | Status: DC

## 2019-12-28 MED ORDER — OXYTOCIN 40 UNITS IN NORMAL SALINE INFUSION - SIMPLE MED: 2.5000 [IU]/h | INTRAVENOUS | Status: AC

## 2019-12-28 MED ORDER — DEXAMETHASONE SODIUM PHOSPHATE 4 MG/ML IJ SOLN
INTRAMUSCULAR | Status: AC
  Filled 2019-12-28: qty 1

## 2019-12-28 MED ORDER — NALBUPHINE HCL 10 MG/ML IJ SOLN: 5.0000 mg | Freq: Once | INTRAMUSCULAR | Status: DC | PRN

## 2019-12-28 MED ORDER — OXYCODONE HCL 5 MG/5ML PO SOLN: 5.0000 mg | Freq: Once | ORAL | Status: DC | PRN

## 2019-12-28 MED ORDER — KETOROLAC TROMETHAMINE 30 MG/ML IJ SOLN
30.0000 mg | Freq: Four times a day (QID) | INTRAMUSCULAR | Status: AC | PRN
  Administered 2019-12-28: 30 mg via INTRAMUSCULAR

## 2019-12-28 SURGICAL SUPPLY — 39 items
BENZOIN TINCTURE PRP APPL 2/3 (GAUZE/BANDAGES/DRESSINGS) ×1 IMPLANT
CHLORAPREP W/TINT 26ML (MISCELLANEOUS) ×2 IMPLANT
CLAMP CORD UMBIL (MISCELLANEOUS) IMPLANT
CLOTH BEACON ORANGE TIMEOUT ST (SAFETY) ×2 IMPLANT
DRAPE C SECTION CLR SCREEN (DRAPES) ×2 IMPLANT
DRSG OPSITE POSTOP 4X10 (GAUZE/BANDAGES/DRESSINGS) ×2 IMPLANT
ELECT REM PT RETURN 9FT ADLT (ELECTROSURGICAL) ×2
ELECTRODE REM PT RTRN 9FT ADLT (ELECTROSURGICAL) ×1 IMPLANT
EXTRACTOR VACUUM KIWI (MISCELLANEOUS) IMPLANT
GAUZE SPONGE 4X4 12PLY STRL LF (GAUZE/BANDAGES/DRESSINGS) ×2 IMPLANT
GLOVE BIO SURGEON STRL SZ 6.5 (GLOVE) ×2 IMPLANT
GLOVE BIOGEL PI IND STRL 7.0 (GLOVE) ×2 IMPLANT
GLOVE BIOGEL PI INDICATOR 7.0 (GLOVE) ×2
GOWN STRL REUS W/TWL LRG LVL3 (GOWN DISPOSABLE) ×4 IMPLANT
HEMOSTAT ARISTA ABSORB 3G PWDR (HEMOSTASIS) ×1 IMPLANT
KIT ABG SYR 3ML LUER SLIP (SYRINGE) IMPLANT
NDL HYPO 25X5/8 SAFETYGLIDE (NEEDLE) IMPLANT
NEEDLE HYPO 25X5/8 SAFETYGLIDE (NEEDLE) IMPLANT
NS IRRIG 1000ML POUR BTL (IV SOLUTION) ×2 IMPLANT
PACK C SECTION WH (CUSTOM PROCEDURE TRAY) ×2 IMPLANT
PAD ABD 7.5X8 STRL (GAUZE/BANDAGES/DRESSINGS) ×1 IMPLANT
PAD OB MATERNITY 4.3X12.25 (PERSONAL CARE ITEMS) ×2 IMPLANT
RETRACTOR WND ALEXIS 25 LRG (MISCELLANEOUS) ×1 IMPLANT
RTRCTR C-SECT PINK 25CM LRG (MISCELLANEOUS) IMPLANT
RTRCTR WOUND ALEXIS 25CM LRG (MISCELLANEOUS) ×2
STRIP CLOSURE SKIN 1/2X4 (GAUZE/BANDAGES/DRESSINGS) ×1 IMPLANT
SUT CHROMIC 1 CTX 36 (SUTURE) ×4 IMPLANT
SUT PLAIN 0 NONE (SUTURE) IMPLANT
SUT PLAIN 2 0 XLH (SUTURE) ×2 IMPLANT
SUT VIC AB 0 CT1 27 (SUTURE) ×6
SUT VIC AB 0 CT1 27XBRD ANBCTR (SUTURE) ×2 IMPLANT
SUT VIC AB 2-0 CT1 27 (SUTURE) ×2
SUT VIC AB 2-0 CT1 TAPERPNT 27 (SUTURE) ×1 IMPLANT
SUT VIC AB 3-0 CT1 27 (SUTURE)
SUT VIC AB 3-0 CT1 TAPERPNT 27 (SUTURE) IMPLANT
SUT VIC AB 4-0 KS 27 (SUTURE) ×2 IMPLANT
TOWEL OR 17X24 6PK STRL BLUE (TOWEL DISPOSABLE) ×2 IMPLANT
TRAY FOLEY W/BAG SLVR 14FR LF (SET/KITS/TRAYS/PACK) ×2 IMPLANT
WATER STERILE IRR 1000ML POUR (IV SOLUTION) ×2 IMPLANT

## 2019-12-28 NOTE — Lactation Note (Signed)
This note was copied from a baby's chart. Lactation Consultation Note  Patient Name: Andrea [name] REQJE'A Date: 12/28/2019 Reason for consult: Initial assessment;Early term 69-38.6wks  Baby is 6 hours old, experienced breast feeder with challenges per mom.  1st child  21 years  and 39 years old. Per mom has been hand expressing well. Mom receptive to review and with assistance, expressed 2-3 ml for when the baby wakes.  Per mom baby fed at 5 :15 pm for 15 mins  And the feeding was comfortable.  Mom aware sh cane call for assistance for latch.  LC provided the information for LPT for her ET just incase she starts feeding like an  Earlier GA baby.  LC provided the [facility] pamphlet with phone numbers.     Maternal Data Has patient been taught Hand Expression?: Yes Does the patient have breastfeeding experience prior to this delivery?: Yes  Feeding Feeding Type: (baby last 1715 for 15 mins)  LATCH Score                   Interventions Interventions: Breast feeding basics reviewed  Lactation Tools Discussed/Used Pump Review: Milk Storage   Consult Status Consult Status: Follow-up Date: 12/29/19 Follow-up type: In-patient    Matilde Sprang Kailene Steinhart 12/28/2019, 5:54 PM

## 2019-12-28 NOTE — Op Note (Signed)
Operative Note    Preoperative Diagnosis: IUP at 37 6/7wks                                              Elective request for cesarean section                                             Hx shoulder dystocia x 2                                             Tyoe 2 DM - moderate control   Postoperative Diagnosis: Same   Procedure: Prime cesarean section   Surgeon: Britt Bottom DO Assist: Webb Silversmith RNFA  Anesthesia: Spinal   Fluids: LR EBL: UOP:   Findings: Viable female infant in vertex; grossly normal uterus, tubes and ovaries                   Apgars 9,9; weight pending   Specimen: Placenta to pathology   Procedure Note Consent verified pre-op. All questions answered   Patient was taken to the operating room where spinal anesthesia was administered. She was prepped and draped in the normal sterile fashion and placed in the dorsal supine position with a leftward tilt. An appropriate time out was performed. A Pfannenstiel skin incision was then made with the scalpel and carried through to the underlying layer of fascia by sharp dissection and Bovie cautery. The were several small bleeders noted. Hemostasis obtained. The fascia was then nicked in the midline and the incision was extended laterally with Mayo scissors. The superior and inferior aspects of the incision were grasped Coker clamps and dissected off the underlying rectus muscles.Rectus muscles were separated in the midline  and the peritoneal cavity entered bluntly. Small bleeding vessels were again cauterized with great hemostasis noted. The peritoneal incision was then extended both superiorly and inferiorly with careful attention to avoid both bowel and bladder. The Alexis self-retaining wound retractor was then placed and the lower uterine segment exposed. The bladder flap was developed with Metzenbaum scissors and pushed away from the lower uterine segment. The lower uterine segment was then  incised in a transverse fashion and the cavity itself entered bluntly.  The infant's head was then lifted and delivered from the incision without difficulty. Vigorous spontaneous cry was noted during delivery.  The remainder of the infant delivered and the nose and mouth bulb suctioned. The cord was clamped and cut after a minute delay. The infant was handed off to the waiting pediatricians. The placenta was then spontaneously expressed from the uterus and the uterus cleared of all clots and debris with moist lap sponge. The uterine incision was then repaired in 2 layers the first layer was a running locked layer 1-0 chromic and the second an imbricating layer of the same suture. The right corner of the incision was suture ligated twice with figure 8 stitches each time to effect hemostasis. The bladder flap edge was also cauterized. Arista was applied over the remaining oozing surfaces. Hemostasis appreciated. The tubes and ovaries were inspected and the gutters cleared of all clots and  debris. The uterine incision was inspected and found to be hemostatic. All instruments and sponges were then removed from the abdomen. The peritoneum was then reapproximated in a running fashion using 2-0 Vicryl.  The fascia was then closed with 0 Vicryl in a running fashion. Subcutaneous tissue was reapproximated with 3-0 plain in a running fashion. The skin was closed with a subcuticular stitch of 4-0 Vicryl on a Keith needle and then reinforced with benzoin and Steri-Strips.A pressure dressing was applied. At the conclusion of the procedure all instruments and sponge counts were correct. Patient was taken to the recovery room in good condition with her baby accompanying her skin to skin.

## 2019-12-28 NOTE — Anesthesia Preprocedure Evaluation (Signed)
Anesthesia Evaluation  Patient identified by MRN, date of birth, ID band Patient awake    Reviewed: Allergy & Precautions, H&P , NPO status , Patient's Chart, lab work & pertinent test results, reviewed documented beta blocker date and time   Airway Mallampati: II  TM Distance: >3 FB Neck ROM: full    Dental no notable dental hx.    Pulmonary neg pulmonary ROS, Current Smoker and Patient abstained from smoking.,    Pulmonary exam normal breath sounds clear to auscultation       Cardiovascular negative cardio ROS Normal cardiovascular exam Rhythm:regular Rate:Normal     Neuro/Psych negative neurological ROS  negative psych ROS   GI/Hepatic negative GI ROS, Neg liver ROS,   Endo/Other  negative endocrine ROSdiabetes  Renal/GU negative Renal ROS  negative genitourinary   Musculoskeletal   Abdominal   Peds  Hematology negative hematology ROS (+)   Anesthesia Other Findings   Reproductive/Obstetrics (+) Pregnancy                             Anesthesia Physical Anesthesia Plan  ASA: II  Anesthesia Plan: Spinal   Post-op Pain Management:    Induction:   PONV Risk Score and Plan:   Airway Management Planned:   Additional Equipment:   Intra-op Plan:   Post-operative Plan:   Informed Consent: I have reviewed the patients History and Physical, chart, labs and discussed the procedure including the risks, benefits and alternatives for the proposed anesthesia with the patient or authorized representative who has indicated his/her understanding and acceptance.       Plan Discussed with:   Anesthesia Plan Comments:         Anesthesia Quick Evaluation

## 2019-12-28 NOTE — Interval H&P Note (Signed)
History and Physical Interval Note: No change from H/P Reviewed intraop expectations To Or when ready  12/28/2019 10:16 AM  Andrea [name]  has presented today for surgery, with the diagnosis of primary C-Section; hx. shoulder dystocia x2.  The various methods of treatment have been discussed with the patient and family. After consideration of risks, benefits and other options for treatment, the patient has consented to  Procedure(s) with comments: CESAREAN SECTION (N/A) - [name], RNFA as a surgical intervention.  The patient's history has been reviewed, patient examined, no change in status, stable for surgery.  I have reviewed the patient's chart and labs.  Questions were answered to the patient's satisfaction.     [name]

## 2019-12-28 NOTE — Anesthesia Postprocedure Evaluation (Signed)
Anesthesia Post Note  Patient: Andrea [name]  Procedure(s) Performed: CESAREAN SECTION (N/A )     Patient location during evaluation: PACU Anesthesia Type: Spinal Level of consciousness: oriented and awake and alert Pain management: pain level controlled Vital Signs Assessment: post-procedure vital signs reviewed and stable Respiratory status: spontaneous breathing, respiratory function stable and patient connected to nasal cannula oxygen Cardiovascular status: blood pressure returned to baseline and stable Postop Assessment: no headache, no backache and no apparent nausea or vomiting Anesthetic complications: no    Last Vitals:  Vitals:   12/28/19 1232 12/28/19 1233  BP:    Pulse: 76 88  Resp: 14 16  Temp:    SpO2: 98% 97%    Last Pain:  Vitals:   12/28/19 1225  TempSrc: Oral   Pain Goal:                Epidural/Spinal Function Cutaneous sensation: Tingles (12/28/19 1225), Patient able to flex knees: No (12/28/19 1225), Patient able to lift hips off bed: No (12/28/19 1225), Back pain beyond tenderness at insertion site: No (12/28/19 1225), Progressively worsening motor and/or sensory loss: No (12/28/19 1225), Bowel and/or bladder incontinence post epidural: No (12/28/19 1225)  Tenisha Fleece

## 2019-12-28 NOTE — Progress Notes (Signed)
Inpatient Diabetes Program Recommendations  AACE/ADA: New Consensus Statement on Inpatient Glycemic Control   Target Ranges:  Prepandial:   less than 140 mg/dL      Peak postprandial:   less than 180 mg/dL (1-2 hours)      Critically ill patients:  140 - 180 mg/dL   Results for [name], Andrea [name] (MRN [id number]) as of 12/28/2019 15:06  Ref. Range 12/28/2019 08:53 12/28/2019 10:53 12/28/2019 12:03  Glucose-Capillary Latest Ref Range: 70 - 99 mg/dL 696 (H)    Decadron 10 mg 101 (H)   Review of Glycemic Control  Diabetes history: DM2 Outpatient Diabetes medications: Levemir 12 units QAM, Novolog 14 units TID before meals, Metformin 500 mg BID Current orders for Inpatient glycemic control: Levemir 12 units QAM, Novolog 14 units TID before meals  Inpatient Diabetes Program Recommendations:    Insulin-Please consider discontinuing Levemir 12 units QAM and Novolog 14 units TID with meals. Then please use Diabetes Treatment for Pregnant/Postpartum Patient order set and go to Postpartum selection to order CBGs AC&HS with Novolog 0-9 units TID with meals.   NOTE: Received page from RN regarding question about ordering CBGs as MD ordered for patient post c-section. Chart reviewed. Noted patient has DM2 hx and was only taking Metformin prior to pregnancy. Patient was currently taking Levemir, Novolog, and Metformin as noted above at time of admission.  Noted patient had c-section today. Anticipate insulin needs to change significantly since patient is now postpartum. Would recommend discontinuing current orders for Levemir and Novolog meal coverage and use Diabetes Treatment for Pregnant/Postpartum Patient order set to order CBGs AC&HS with Novolog correction.   Thanks, Orlando Penner, RN, MSN, CDE Diabetes Coordinator Inpatient Diabetes Program [phone] (Team Pager from 8am to 5pm)

## 2019-12-28 NOTE — Transfer of Care (Signed)
Immediate Anesthesia Transfer of Care Note  Patient: Andrea [name]  Procedure(s) Performed: CESAREAN SECTION (N/A )  Patient Location: PACU  Anesthesia Type:Spinal  Level of Consciousness: awake, alert  and oriented  Airway & Oxygen Therapy: Patient Spontanous Breathing  Post-op Assessment: Report given to RN and Post -op Vital signs reviewed and stable  Post vital signs: Reviewed and stable  Last Vitals:  Vitals Value Taken Time  BP 114/83 12/28/19 1153  Temp 36.6 C 12/28/19 1153  Pulse 78 12/28/19 1157  Resp 15 12/28/19 1157  SpO2 99 % 12/28/19 1157  Vitals shown include unvalidated device data.  Last Pain:  Vitals:   12/28/19 1153  TempSrc: Oral         Complications: No apparent anesthesia complications

## 2019-12-28 NOTE — Progress Notes (Signed)
RN discussed recommendation from the diabetic coordinator with Dr. Mindi Slicker.  Order received to put in AC/HS CBG orders with novolog coverage in the Diabetes Treatment for Pregnant/Postpartum Patient order set. RN also received order to discontinued the current insulin orders.  Dr. Mindi Slicker also gave order for Tylenol 650mg  q6 hrs prn.  Andrea [name], 

## 2019-12-28 NOTE — Interval H&P Note (Signed)
History and Physical Interval Note:  12/28/2019 10:16 AM  Andrea [name]  has presented today for surgery, with the diagnosis of primary C-Section; hx. shoulder dystocia x2.  The various methods of treatment have been discussed with the patient and family. After consideration of risks, benefits and other options for treatment, the patient has consented to  Procedure(s) with comments: CESAREAN SECTION (N/A) - [name], RNFA as a surgical intervention.  The patient's history has been reviewed, patient examined, no change in status, stable for surgery.  I have reviewed the patient's chart and labs.  Questions were answered to the patient's satisfaction.     [name]

## 2019-12-29 LAB — CBC
HCT: 27.1 % — ABNORMAL LOW (ref 36.0–46.0)
Hemoglobin: 8.7 g/dL — ABNORMAL LOW (ref 12.0–15.0)
MCH: 28.4 pg (ref 26.0–34.0)
MCHC: 32.1 g/dL (ref 30.0–36.0)
MCV: 88.6 fL (ref 80.0–100.0)
Platelets: 170 10*3/uL (ref 150–400)
RBC: 3.06 MIL/uL — ABNORMAL LOW (ref 3.87–5.11)
RDW: 13.8 % (ref 11.5–15.5)
WBC: 10.1 10*3/uL (ref 4.0–10.5)
nRBC: 0 % (ref 0.0–0.2)

## 2019-12-29 LAB — BIRTH TISSUE RECOVERY COLLECTION (PLACENTA DONATION)

## 2019-12-29 MED ORDER — PNEUMOCOCCAL VAC POLYVALENT 25 MCG/0.5ML IJ INJ: 0.5000 mL | INJECTION | INTRAMUSCULAR | Status: DC

## 2019-12-29 MED ORDER — METFORMIN HCL 500 MG PO TABS
1000.0000 mg | ORAL_TABLET | Freq: Two times a day (BID) | ORAL | Status: DC
  Administered 2019-12-29 – 2019-12-30 (×2): 1000 mg via ORAL
  Filled 2019-12-29 (×2): qty 2

## 2019-12-29 NOTE — Progress Notes (Signed)
Patient ID: Andrea [name], female   DOB: 01/10/1981, 39 y.o.   MRN: [id number] Reviewed BS and all have been normal today on metformin 1000mg  po BID  D/c sliding scale  Continue to record BS fasting and one hour pp in chart from CGM device. Will have patient continue metformin and f/u with Dr. after discharge to adjust regimen

## 2019-12-29 NOTE — Lactation Note (Addendum)
This note was copied from a baby's chart. Lactation Consultation Note  Patient Name: Andrea [name] VFIEP'P Date: 12/29/2019 Reason for consult: Follow-up assessment;Mother's request P3, 34 hour ETI female infant. Per parents, infant had two voids and two stools today. Per mom, she really wants to breastfeed with baby #3 , she only breastfeed 2 weeks first child had low milk supply and 2nd child she stopped early  1 week due to developing mastitis. Mom was given DEBP but she only pumped once she was discouraged because she did not see colostrum.  LC explained the importance of using DEBP is for breast stimulation, induction and to help establish milk supply. LC explained colostrum is thick and suggested she hand expressed after using DEBP, LC reviewed hand expression and mom expressed 2 mls of colostrum that was spoon fed to infant. Infant started cuing, LC assisted mom with latching infant at breast, mom latched infant on right breast using the football hold position, LC asked mom to wait until infant's mouth is wide, tongue down, bring infant to breast chin first. Infant latched without difficulty, swallows observed, infant breastfeed for 12 minutes, mom was pleased, latch was not painful like before. LC discussed with mom to try different positions with feeding infant, not to wear tight clothing or underwire bra to prevent pressure points to avoid mastitis.  Mom will continue to breastfeed infant according to cues, on demand, 8 to 12 times within 24 hours and not exceed 3 hours without breastfeeding infant. Mom knows to call RN or LC if she needs assistance with latching infant at breast. Mom was using DEBP as LC left room, she plans to start pumping every 3 hours for 15 minutes as previously advised by other LC. LC discussed with mom breastfeeding community support after hospital discharge: LC hot line, LC out patient clinic and LC online breastfeeding support group.   Maternal Data     Feeding    LATCH Score Latch: Grasps breast easily, tongue down, lips flanged, rhythmical sucking.  Audible Swallowing: Spontaneous and intermittent  Type of Nipple: Everted at rest and after stimulation  Comfort (Breast/Nipple): Soft / non-tender  Hold (Positioning): Assistance needed to correctly position infant at breast and maintain latch.  LATCH Score: 9  Interventions Interventions: Skin to skin;Support pillows;Adjust position;Assisted with latch;Breast compression;Position options;Hand express;Expressed milk  Lactation Tools Discussed/Used     Consult Status Consult Status: Follow-up Date: 12/30/19 Follow-up type: In-patient    Danelle Earthly 12/29/2019, 9:20 PM

## 2019-12-29 NOTE — Progress Notes (Signed)
Subjective: Postpartum Day 1: Cesarean Delivery Patient reports tolerating PO and no problems voiding.  Ambulating  Objective: Vital signs in last 24 hours: Temp:  [97.7 F (36.5 C)-98.8 F (37.1 C)] 97.7 F (36.5 C) (03/17 0557) Pulse Rate:  [72-99] 72 (03/17 0557) Resp:  [12-24] 18 (03/17 0557) BP: (88-114)/(62-83) 106/62 (03/17 0557) SpO2:  [95 %-100 %] 96 % (03/17 0557)  Physical Exam:  General: alert and cooperative Lochia: appropriate Uterine Fundus: firm Incision: C/D/I   Recent Labs    12/29/19 0644  HGB 8.7*  HCT 27.1*    Assessment/Plan: Status post Cesarean section. Doing well postoperatively.  Pt reports prepregnancy was managed with metformin 1000mg  po BID--ordered to resume.  Will see what BS run today to see if will need to add additional coverage.  FBS per pt this am in the 70's.  12/29/2019, 10:39 AM

## 2019-12-29 NOTE — Progress Notes (Signed)
Dr. Senaida Ores updated on Patients CBG and insulin coverage status. She wanted to start Patient on  Metformin 1000mg  BID, and to  Check CBG via FreeStyle one hour after meals today only in addition to previous orders.

## 2019-12-29 NOTE — Progress Notes (Signed)
One hour CBG 117 per patient.

## 2019-12-29 NOTE — Progress Notes (Signed)
Dr. Senaida Ores updated on patient's CBGS. She stated it was ok discontinue sliding scale.

## 2019-12-29 NOTE — Progress Notes (Signed)
CBG at lunch was 102, 1 hour after 127.

## 2019-12-29 NOTE — Progress Notes (Signed)
Spoke with Diabetic coordinator regarding protocol for the [employer]. It is ok to have patient tell RN the [place] results with meals and do insulin coverage as needed. No carbohydrate counts needed for this patient per Diabetic Coordinator since she is a type 2 DM.

## 2019-12-29 NOTE — Progress Notes (Signed)
AC CBG check done by patient per her FreeStyle Libre at breakfast was 72. No insulin coverage needed. Mom knows to call out for RN with each meal for CBG result she understood. RN night shifts states that they have been just using the patient's FreeStyle Libre and not using our CBG monitor. Patient encourage to call out for pain meds and use incentive spirometer. Patient encouraged to walk halls today and keep up with yellow feeding log.

## 2019-12-29 NOTE — Clinical Social Work Maternal (Signed)
CLINICAL SOCIAL WORK MATERNAL/CHILD NOTE  Patient Details  Name: Andrea [name] MRN: [id number] Date of Birth: Sep 01, 1981  Date:  12/29/2019  Clinical Social Worker Initiating Note:  Durward Fortes, LCSW Date/Time: Initiated:  12/29/19/0900     Child's Name:  Andrea [name]   Biological Parents:  Mother, Father(Andrea [name], Andrea [name])   Need for Interpreter:  None   Reason for Referral:  Behavioral Health Concerns   Address:  [address] [place] [address]    Phone number:  [phone] (home) [phone] (work)    Additional phone number: none   Household Members/Support Persons (HM/SP):   Household Member/Support Person 2   HM/SP Name Relationship DOB or Age  HM/SP -1   John Deshotels  FOB     HM/SP -2 Jaelene Brune MOB  39 years old  HM/SP -78   Zoey Basham  daughter   67 years old   HM/SP -4        HM/SP -5        HM/SP -6        HM/SP -7        HM/SP -8          Natural Supports (not living in the home):  Friends, Immediate Family   Professional Supports: Therapist(not seeing currently but reports that she has access to therapist.)   Employment: Full-time   Type of Work: did not disclose of job.   Education:  Some College   Homebound arranged:  n/a  Museum/gallery curator Resources:  Nutritional therapist.)   Other Resources:    none requested.   Cultural/Religious Considerations Which May Impact Care:  none reported.   Strengths:  Ability to meet basic needs , Compliance with medical plan , Home prepared for child , Pediatrician chosen, Psychotropic Medications   Psychotropic Medications:  Prozac      Pediatrician:    [place] area  Pediatrician List:   [place])  [place]      Pediatrician Fax Number:    Risk Factors/Current Problems:  None   Cognitive State:  Able to Concentrate , Insightful ,  Alert    Mood/Affect:  Relaxed , Comfortable , Calm , Happy , Interested    CSW Assessment: CSW consulted as MOB has a history of Bipolar, anxiety, depression, and manic depression. ,CSW went to speak with MOB to address further needs.  CSW congratulated MOB and FOB on the birth of infant. CSW advised MOB and FOB of CSW's role and the reason for CSW coming to visit with her.  MOB reported that she was diagnosed with depression and anxiety at the age of 39. MOB reported that she was placed on medications at that time and prior to pregnancy, MOB reported that she was on Prozac, which was very helpful for her. MOB was asked about having Bipolar and MOB reports that she has never been diagnosed with Bipolar and isnt sure why that is in her chart. CSW understanding of this and inquired from MOB on any other mental health diagnosis that MOB may have. MOB expressed that she only has anxiety and depression. MOB informed CSW that she hasn't been feeling SI or HI and reports that she has been feeling fine since giving birth. MOB did report that if she feels that she needs to restart her prozac, then she would once she was finished breast  feeding. CSW did advise MOB that if for any reason she feels that she needs medications before breast feeding is complete, she should speak with OB provider about getting placed on a different medications that is better suitable with breast feeding. MOB understanding of this.   MOB advised CSW that she has all needed items to care for infant with support from her spouse and other relatives.   CSW took time to provide MOB with PPD and SIDS education. MOB was given PPD Checklist in order to keep track of her feelings as they relate to PPD.   CSW Plan/Description:  No Further Intervention Required/No Barriers to Discharge, Sudden Infant Death Syndrome (SIDS) Education, Perinatal Mood and Anxiety Disorder (PMADs) Education    Robb Matar, LCSWA 12/29/2019, 9:23 AM

## 2019-12-29 NOTE — Progress Notes (Signed)
Dr. Senaida Ores updated on all CBGs today. She states it is ok just to do a fasting CBG in am and 1 hour post meal CBG only. No AC CBGs needed at this point.

## 2019-12-30 ENCOUNTER — Other Ambulatory Visit (HOSPITAL_COMMUNITY): Payer: Self-pay | Admitting: Obstetrics and Gynecology

## 2019-12-30 MED ORDER — IBUPROFEN 800 MG PO TABS: 800.0000 mg | ORAL_TABLET | Freq: Three times a day (TID) | ORAL | 3 refills | Status: DC | PRN

## 2019-12-30 MED ORDER — METFORMIN HCL 1000 MG PO TABS: 1000.0000 mg | ORAL_TABLET | Freq: Two times a day (BID) | ORAL | 3 refills | Status: DC

## 2019-12-30 MED ORDER — OXYCODONE HCL 5 MG PO TABS: 5.0000 mg | ORAL_TABLET | Freq: Four times a day (QID) | ORAL | 0 refills | Status: DC | PRN

## 2019-12-30 MED ORDER — PRENATAL MULTIVITAMIN CH: 1.0000 | ORAL_TABLET | Freq: Every day | ORAL | 3 refills | Status: DC

## 2019-12-30 NOTE — Discharge Summary (Signed)
OB Discharge Summary     Patient Name: Andrea [name] DOB: 09/16/1981 MRN: [id number]  Date of admission: 12/28/2019 Delivering MD: Pryor Ochoa [facility]   Date of discharge: 12/30/2019  Admitting diagnosis: Postpartum care following cesarean delivery [Z39.2] Term pregnancy [Z34.90] Intrauterine pregnancy: [redacted]w[redacted]d     Secondary diagnosis:  Active Problems:   Status post cesarean delivery   Postpartum care following cesarean delivery   Term pregnancy  Additional problems: N/A     Discharge diagnosis: Term Pregnancy Delivered                                                                                                Post partum procedures:N/A  Augmentation: N/A  Complications: None  Hospital course:  Sceduled C/S   39 y.o. yo G3P3003 at [redacted]w[redacted]d was admitted to the hospital 12/28/2019 for scheduled cesarean section with the following indication:h/o shoulder dystocia x 2.  Membrane Rupture Time/Date: 10:50 AM ,12/28/2019   Patient delivered a Viable infant.12/28/2019  Details of operation can be found in separate operative note.  Pateint had an uncomplicated postpartum course.  She is ambulating, tolerating a regular diet, passing flatus, and urinating well. Patient is discharged home in stable condition on  12/30/19         Physical exam  Vitals:   12/29/19 0557 12/29/19 1758 12/29/19 2037 12/30/19 0501  BP: 106/62 107/68 100/69 95/62  Pulse: 72 85 86 77  Resp: 18 14 15 18   Temp: 97.7 F (36.5 C) 97.9 F (36.6 C) 97.8 F (36.6 C) 98 F (36.7 C)  TempSrc: Oral Oral Oral Oral  SpO2: 96%  99% 100%  Weight:      Height:       General: alert and no distress Lochia: appropriate Uterine Fundus: firm Incision: Healing well with no significant drainage DVT Evaluation: No evidence of DVT seen on physical exam. Labs: Lab Results  Component Value Date   WBC 10.1 12/29/2019   HGB 8.7 (L) 12/29/2019   HCT 27.1 (L) 12/29/2019   MCV 88.6 12/29/2019   PLT 170 12/29/2019    CMP Latest Ref Rng & Units 12/26/2019  Glucose 70 - 99 mg/dL 12/28/2019)  BUN 6 - 20 mg/dL 12  Creatinine 250(N - 3.97 mg/dL 6.73  Sodium 4.19 - 379 mmol/L 137  Potassium 3.5 - 5.1 mmol/L 3.8  Chloride 98 - 111 mmol/L 103  CO2 22 - 32 mmol/L 20(L)  Calcium 8.9 - 10.3 mg/dL 9.6  Total Protein 6.5 - 8.1 g/dL 6.3(L)  Total Bilirubin 0.3 - 1.2 mg/dL 0.5  Alkaline Phos 38 - 126 U/L 134(H)  AST 15 - 41 U/L 17  ALT 0 - 44 U/L 18    Discharge instruction: per After Visit Summary and "Baby and Me Booklet".  After visit meds:  Allergies as of 12/30/2019   No Known Allergies     Medication List    STOP taking these medications   aspirin EC 81 MG tablet   insulin aspart 100 UNIT/ML injection Commonly known as: novoLOG   insulin detemir 100 UNIT/ML injection Commonly known as: LEVEMIR  TAKE these medications   acetaminophen 500 MG tablet Commonly known as: TYLENOL Take 1,000 mg by mouth every 6 (six) hours as needed for mild pain or headache.   folic acid 741 MCG tablet Commonly known as: FOLVITE Take 2,400 mcg by mouth daily.   FreeStyle Libre 14 Day Sensor Misc 1 each by Does not apply route every 14 (fourteen) days.   ibuprofen 800 MG tablet Commonly known as: ADVIL Take 1 tablet (800 mg total) by mouth every 8 (eight) hours as needed.   metFORMIN 1000 MG tablet Commonly known as: GLUCOPHAGE Take 1 tablet (1,000 mg total) by mouth 2 (two) times daily with a meal. What changed:   medication strength  how much to take  when to take this   oxyCODONE 5 MG immediate release tablet Commonly known as: Oxy IR/ROXICODONE Take 1-2 tablets (5-10 mg total) by mouth every 6 (six) hours as needed for moderate pain.   prenatal multivitamin Tabs tablet Take 1 tablet by mouth daily at 12 noon.       Diet: routine diet  Activity: Advance as tolerated. Pelvic rest for 6 weeks.   Outpatient follow up:2 and 6 weeks Follow up Appt:No future appointments. Follow up Visit:No  follow-ups on file.  Postpartum contraception: Undecided  Newborn Data: Live born female  Birth Weight: 7 lb 15.9 oz (3625 g) APGAR: 66, 9  Newborn Delivery   Birth date/time: 12/28/2019 10:51:00 Delivery type: C-Section, Low Transverse Trial of labor: No C-section categorization: Primary      Baby Feeding: Breast Disposition:home with mother   12/30/2019 Janyth Contes, MD

## 2019-12-30 NOTE — Progress Notes (Signed)
Talked with patient about ways to remove pressure dressing, stressing the need to have this dressing removed prior to discharge. Pt can shower using water to help, use alcohol pads to help, or just pull tape off (not recommended). Pt falling asleep through conversation and asking RN to come back later.

## 2019-12-30 NOTE — Progress Notes (Addendum)
Subjective: Postpartum Day 2: Cesarean Delivery Patient reports incisional pain, tolerating PO and no problems voiding.  Pain controlled, nl lochia  Objective: Vital signs in last 24 hours: Temp:  [97.8 F (36.6 C)-98 F (36.7 C)] 98 F (36.7 C) (03/18 0501) Pulse Rate:  [77-86] 77 (03/18 0501) Resp:  [14-18] 18 (03/18 0501) BP: (95-107)/(62-69) 95/62 (03/18 0501) SpO2:  [99 %-100 %] 100 % (03/18 0501) FSBS 80-100 on metformin  Physical Exam:  General: alert and no distress Lochia: appropriate Uterine Fundus: firm Incision: healing well DVT Evaluation: No evidence of DVT seen on physical exam.  Recent Labs    12/29/19 0644  HGB 8.7*  HCT 27.1*    Assessment/Plan: Status post Cesarean section. Doing well postoperatively.  Continue current care.  Desires d/c to home - Will d/c w Motrin, oxycodone and PNV,  F/u in 2 and 6 weeks Also d/w w metformin  Andrea [name] 12/30/2019, 8:12 AM

## 2019-12-30 NOTE — Addendum Note (Signed)
Addendum  created 12/30/19 0747 by Bethena Midget, MD   Child order released for a procedure order, Clinical Note Signed, Intraprocedure Blocks edited

## 2019-12-30 NOTE — Anesthesia Procedure Notes (Signed)
Spinal  Patient location during procedure: OR Start time: 12/28/2019 7:46 AM End time: 12/28/2019 10:25 AM Preanesthetic Checklist Completed: patient identified, IV checked, site marked, risks and benefits discussed, surgical consent, monitors and equipment checked, pre-op evaluation and timeout performed Spinal Block Patient position: sitting Prep: DuraPrep Patient monitoring: heart rate, cardiac monitor, continuous pulse ox and blood pressure Approach: midline Location: L3-4 Injection technique: single-shot Needle Needle type: Sprotte  Needle gauge: 24 G Needle length: 9 cm Assessment Sensory level: T4

## 2019-12-30 NOTE — Lactation Note (Signed)
This note was copied from a baby's chart. Lactation Consultation Note  Patient Name: Andrea [name] PHXTA'V Date: 12/30/2019 Reason for consult: Follow-up assessment Baby is 46 hours old/6% weight loss.  Mom feels baby is feeding well.  She is cluster feeding.  Mom with a little soreness.  Discomfort with initial latch which improves after the first minute.  Discussed milk coming to volume and the prevention and treatment of engorgement.  Mom has a DEBP at home.  She denies questions or concerns.  Reviewed outpatient services and encouraged to call prn.  Maternal Data    Feeding Feeding Type: Breast Fed  LATCH Score                   Interventions    Lactation Tools Discussed/Used     Consult Status Consult Status: Complete Follow-up type: Call as needed    Huston Foley 12/30/2019, 8:53 AM

## 2019-12-31 ENCOUNTER — Encounter: Payer: Self-pay | Admitting: Family Medicine

## 2020-01-04 ENCOUNTER — Encounter: Payer: Self-pay | Admitting: Family Medicine

## 2020-01-09 ENCOUNTER — Ambulatory Visit: Payer: 59 | Attending: Internal Medicine

## 2020-01-09 DIAGNOSIS — Z23 Encounter for immunization: Secondary | ICD-10-CM

## 2020-01-09 NOTE — Progress Notes (Signed)
   Covid-19 Vaccination Clinic  Name:  Andrea [name]    MRN: [id number] DOB: May 18, 1981  01/09/2020  Andrea [name] was observed post Covid-19 immunization for 15 minutes without incident. She was provided with Vaccine Information Sheet and instruction to access the V-Safe system.   Andrea [name] was instructed to call 911 with any severe reactions post vaccine: Marland Kitchen Difficulty breathing  . Swelling of face and throat  . A fast heartbeat  . A bad rash all over body  . Dizziness and weakness   Immunizations Administered    Name Date Dose VIS Date Route   Pfizer COVID-19 Vaccine 01/09/2020  2:09 PM 0.3 mL 09/24/2019 Intramuscular   Manufacturer: [employer], [employer]   Lot: [id number]   NDC: [address]-8

## 2020-02-01 ENCOUNTER — Ambulatory Visit: Payer: 59 | Attending: Internal Medicine

## 2020-02-01 DIAGNOSIS — Z23 Encounter for immunization: Secondary | ICD-10-CM

## 2020-02-01 NOTE — Progress Notes (Signed)
   Covid-19 Vaccination Clinic  Name:  Andrea [name]    MRN: [id number] DOB: [birth year]  02/01/2020  [name] was observed post Covid-19 immunization for 15 minutes without incident. She was provided with Vaccine Information Sheet and instruction to access the V-Safe system.   [name] was instructed to call 911 with any severe reactions post vaccine: Marland Kitchen Difficulty breathing  . Swelling of face and throat  . A fast heartbeat  . A bad rash all over body  . Dizziness and weakness   Immunizations Administered    Name Date Dose VIS Date Route   Pfizer COVID-19 Vaccine 02/01/2020  3:40 PM 0.3 mL 12/08/2018 Intramuscular   Manufacturer: [employer], [employer]   Lot: [id number]   NDC: [address]-6

## 2020-02-15 ENCOUNTER — Encounter: Payer: Self-pay | Admitting: Family Medicine

## 2020-02-16 ENCOUNTER — Encounter: Payer: Self-pay | Admitting: Family Medicine

## 2020-02-16 ENCOUNTER — Other Ambulatory Visit: Payer: Self-pay

## 2020-02-16 ENCOUNTER — Ambulatory Visit (INDEPENDENT_AMBULATORY_CARE_PROVIDER_SITE_OTHER): Payer: 59 | Admitting: Family Medicine

## 2020-02-16 VITALS — BP 126/80 | HR 92 | Temp 97.9°F | Resp 14 | Ht 61.0 in | Wt 168.0 lb

## 2020-02-16 DIAGNOSIS — R197 Diarrhea, unspecified: Secondary | ICD-10-CM

## 2020-02-16 DIAGNOSIS — E119 Type 2 diabetes mellitus without complications: Secondary | ICD-10-CM

## 2020-02-16 MED ORDER — METFORMIN HCL ER 500 MG PO TB24: 500.0000 mg | ORAL_TABLET | Freq: Two times a day (BID) | ORAL | 2 refills | Status: DC

## 2020-02-16 NOTE — Assessment & Plan Note (Signed)
A1C looks great, Diarrhea likely medication affect Will reduce back to ER version which is tolerated better  500MG  BID Check CBG, if CBG are low will reduce to once daily dosing No red flags on abd exam Check metabolic panel for renal function and K If improved no other work up needed

## 2020-02-16 NOTE — Progress Notes (Signed)
   Subjective:    Patient ID: Andrea [name], female    DOB: 1981/10/06, 39 y.o.   MRN: [id number]  Patient presents for GI Upset (increased diarrhea- thinks it's new dose of MTF- would like to go back on ER )  Pt here with diarrhea, GI upset - diarrhea started  4/19, she is concerned it was due to higher dose of metformin. She was on    extended release 500mg   BID during most of pregnancy at discharge from hospital given 1000mg  intermediate release BID.   Last A1C  5.4% on 4/28    No vomiting, nodizziness   No cramping  No UTI has some soreness in lower abd   No UTI symptoms  No blood in stool  She went back to old dose yesterday and today and no diarrhea   She otherwise feels well          Review Of Systems:  GEN- denies fatigue, fever, weight loss,weakness, recent illness HEENT- denies eye drainage, change in vision, nasal discharge, CVS- denies chest pain, palpitations RESP- denies SOB, cough, wheeze ABD- denies N/V, +change in stools, abd pain GU- denies dysuria, hematuria, dribbling, incontinence MSK- denies joint pain, muscle aches, injury Neuro- denies headache, dizziness, syncope, seizure activity       Objective:    BP 126/80   Pulse 92   Temp 97.9 F (36.6 C) (Temporal)   Resp 14   Ht 5\' 1"  (1.549 m)   Wt 168 lb (76.2 kg)   SpO2 99%   BMI 31.74 kg/m  GEN- NAD, alert and oriented x3,well appearing  HEENT- PERRL, EOMI, non injected sclera, pink conjunctiva, MMM, oropharynx clear Neck- Supple, CVS- RRR, no murmur RESP-CTAB ABD-NABS,soft,NT,ND EXT- No edema Pulses- Radial  2+        Assessment & Plan:      Problem List Items Addressed This Visit      Unprioritized   Diabetes mellitus type 2, uncomplicated (HCC) - Primary    A1C looks great, Diarrhea likely medication affect Will reduce back to ER version which is tolerated better  500MG  BID Check CBG, if CBG are low will reduce to once daily dosing No red flags on abd exam Check  metabolic panel for renal function and K If improved no other work up needed       Relevant Medications   metFORMIN (GLUCOPHAGE XR) 500 MG 24 hr tablet   Other Relevant Orders   CBC with Differential/Platelet   Comprehensive metabolic panel    Other Visit Diagnoses    Diarrhea, unspecified type          Note: This dictation was prepared with Dragon dictation along with smaller phrase technology. Any transcriptional errors that result from this process are unintentional.

## 2020-02-16 NOTE — Patient Instructions (Signed)
F/u 3 MONTHS I will send results via Mychart

## 2020-02-17 LAB — CBC WITH DIFFERENTIAL/PLATELET
Absolute Monocytes: 728 cells/uL (ref 200–950)
Basophils Absolute: 73 cells/uL (ref 0–200)
Basophils Relative: 0.7 %
Eosinophils Absolute: 250 cells/uL (ref 15–500)
Eosinophils Relative: 2.4 %
HCT: 40.6 % (ref 35.0–45.0)
Hemoglobin: 13.3 g/dL (ref 11.7–15.5)
Lymphs Abs: 3058 cells/uL (ref 850–3900)
MCH: 27.8 pg (ref 27.0–33.0)
MCHC: 32.8 g/dL (ref 32.0–36.0)
MCV: 84.8 fL (ref 80.0–100.0)
MPV: 10.9 fL (ref 7.5–12.5)
Monocytes Relative: 7 %
Neutro Abs: 6292 cells/uL (ref 1500–7800)
Neutrophils Relative %: 60.5 %
Platelets: 320 10*3/uL (ref 140–400)
RBC: 4.79 10*6/uL (ref 3.80–5.10)
RDW: 12.6 % (ref 11.0–15.0)
Total Lymphocyte: 29.4 %
WBC: 10.4 10*3/uL (ref 3.8–10.8)

## 2020-02-17 LAB — COMPREHENSIVE METABOLIC PANEL
AG Ratio: 1.8 (calc) (ref 1.0–2.5)
ALT: 22 U/L (ref 6–29)
AST: 14 U/L (ref 10–30)
Albumin: 4.5 g/dL (ref 3.6–5.1)
Alkaline phosphatase (APISO): 86 U/L (ref 31–125)
BUN: 14 mg/dL (ref 7–25)
CO2: 26 mmol/L (ref 20–32)
Calcium: 9.6 mg/dL (ref 8.6–10.2)
Chloride: 105 mmol/L (ref 98–110)
Creat: 0.7 mg/dL (ref 0.50–1.10)
Globulin: 2.5 g/dL (calc) (ref 1.9–3.7)
Glucose, Bld: 110 mg/dL — ABNORMAL HIGH (ref 65–99)
Potassium: 4.6 mmol/L (ref 3.5–5.3)
Sodium: 141 mmol/L (ref 135–146)
Total Bilirubin: 0.4 mg/dL (ref 0.2–1.2)
Total Protein: 7 g/dL (ref 6.1–8.1)

## 2020-04-19 ENCOUNTER — Other Ambulatory Visit: Payer: Self-pay | Admitting: *Deleted

## 2020-04-19 ENCOUNTER — Other Ambulatory Visit: Payer: Self-pay | Admitting: Family Medicine

## 2020-04-19 MED ORDER — METFORMIN HCL ER 500 MG PO TB24: 500.0000 mg | ORAL_TABLET | Freq: Two times a day (BID) | ORAL | 3 refills | Status: DC

## 2020-04-19 MED FILL — METFORMIN HCL ER 500 MG TB2: 500 | 90 days supply | Qty: 180 | Fill #0

## 2020-05-17 ENCOUNTER — Encounter: Payer: Self-pay | Admitting: Family Medicine

## 2020-06-27 MED FILL — PRENATAL 27-1 MG TABS: 27-1 | 90 days supply | Qty: 90 | Fill #0

## 2020-07-21 ENCOUNTER — Encounter: Payer: Self-pay | Admitting: *Deleted

## 2020-08-10 ENCOUNTER — Ambulatory Visit: Payer: 59

## 2020-08-17 ENCOUNTER — Ambulatory Visit (INDEPENDENT_AMBULATORY_CARE_PROVIDER_SITE_OTHER): Payer: No Typology Code available for payment source | Admitting: *Deleted

## 2020-08-17 ENCOUNTER — Other Ambulatory Visit: Payer: Self-pay

## 2020-08-17 DIAGNOSIS — Z23 Encounter for immunization: Secondary | ICD-10-CM

## 2020-09-26 ENCOUNTER — Encounter: Payer: Self-pay | Admitting: Family Medicine

## 2020-09-29 ENCOUNTER — Encounter: Payer: Self-pay | Admitting: Family Medicine

## 2020-09-29 NOTE — Telephone Encounter (Signed)
I already placed referral Okay to send to Dr. Magnus Ivan, looks like she is in [place]

## 2020-10-05 ENCOUNTER — Encounter: Payer: Self-pay | Admitting: Family Medicine

## 2020-10-18 MED FILL — PRENATAL 27-1 MG TABS: 27-1 | 90 days supply | Qty: 90 | Fill #1

## 2020-10-18 MED FILL — metFORMIN HCL ER 500 MG TB2: 500 | 90 days supply | Qty: 180 | Fill #0

## 2020-12-15 ENCOUNTER — Encounter: Payer: Self-pay | Admitting: Family Medicine

## 2020-12-25 ENCOUNTER — Encounter: Payer: Self-pay | Admitting: Nurse Practitioner

## 2020-12-25 ENCOUNTER — Telehealth (INDEPENDENT_AMBULATORY_CARE_PROVIDER_SITE_OTHER): Payer: No Typology Code available for payment source | Admitting: Nurse Practitioner

## 2020-12-25 ENCOUNTER — Other Ambulatory Visit (HOSPITAL_COMMUNITY): Payer: Self-pay | Admitting: Nurse Practitioner

## 2020-12-25 DIAGNOSIS — J069 Acute upper respiratory infection, unspecified: Secondary | ICD-10-CM | POA: Diagnosis not present

## 2020-12-25 DIAGNOSIS — R0981 Nasal congestion: Secondary | ICD-10-CM

## 2020-12-25 MED ORDER — SALINE SPRAY 0.65 % NA SOLN: 1.0000 | NASAL | 0 refills | Status: DC | PRN

## 2020-12-25 NOTE — Patient Instructions (Signed)
Viral Respiratory Infection A viral respiratory infection is an illness that affects parts of the body that are used for breathing. These include the lungs, nose, and throat. It is caused by a germ called a virus. Some examples of this kind of infection are:  A cold.  The flu (influenza).  A respiratory syncytial virus (RSV) infection. A person who gets this illness may have the following symptoms:  A stuffy or runny nose.  Yellow or green fluid in the nose.  A cough.  Sneezing.  Tiredness (fatigue).  Achy muscles.  A sore throat.  Sweating or chills.  A fever.  A headache. Follow these instructions at home: Managing pain and congestion  Take over-the-counter and prescription medicines only as told by your doctor.  If you have a sore throat, gargle with salt water. Do this 3-4 times per day or as needed. To make a salt-water mixture, dissolve -1 tsp of salt in 1 cup of warm water. Make sure that all the salt dissolves.  Use nose drops made from salt water. This helps with stuffiness (congestion). It also helps soften the skin around your nose.  Drink enough fluid to keep your pee (urine) pale yellow. General instructions  Rest as much as possible.  Do not drink alcohol.  Do not use any products that have nicotine or tobacco, such as cigarettes and e-cigarettes. If you need help quitting, ask your doctor.  Keep all follow-up visits as told by your doctor. This is important.   How is this prevented?  Get a flu shot every year. Ask your doctor when you should get your flu shot.  Do not let other people get your germs. If you are sick: ? Stay home from work or school. ? Wash your hands with soap and water often. Wash your hands after you cough or sneeze. If soap and water are not available, use hand sanitizer.  Avoid contact with people who are sick during cold and flu season. This is in fall and winter.   Get help if:  Your symptoms last for 10 days or  longer.  Your symptoms get worse over time.  You have a fever.  You have very bad pain in your face or forehead.  Parts of your jaw or neck become very swollen. Get help right away if:  You feel pain or pressure in your chest.  You have shortness of breath.  You faint or feel like you will faint.  You keep throwing up (vomiting).  You feel confused. Summary  A viral respiratory infection is an illness that affects parts of the body that are used for breathing.  Examples of this illness include a cold, the flu, and respiratory syncytial virus (RSV) infection.  The infection can cause a runny nose, cough, sneezing, sore throat, and fever.  Follow what your doctor tells you about taking medicines, drinking lots of fluid, washing your hands, resting at home, and avoiding people who are sick. This information is not intended to replace advice given to you by your health care provider. Make sure you discuss any questions you have with your health care provider. Document Revised: 10/08/2018 Document Reviewed: 11/10/2017 Elsevier Patient Education  2021 [employer]  

## 2020-12-25 NOTE — Progress Notes (Signed)
Subjective:    Patient ID: Andrea [name], female    DOB: March 25, 1981, 40 y.o.   MRN: [id number]  HPI: Andrea [name] is a 40 y.o. female presenting virtually for congestion and cough.  Chief Complaint  Patient presents with  . Nasal Congestion    Sx began this wkn, coughing, chest tightness, fatigue and body aches. Took home covid test, results are neg. Has been vaccinated with covid and boostered. Does not remember booster date   UPPER RESPIRATORY TRACT INFECTION Is currently breastfeeding COVID testing history: tested yesterday at home - negative COVID vaccination status: Onset: weekend - Saturday Fever: no; was having chills   Cough: productive  Shortness of breath: no Wheezing: no Chest pain: no Chest tightness: at first, improving now Chest congestion: yes Nasal congestion: yes  Runny nose: yes Post nasal drip: yes Sneezing: yes Sore throat: at first, improving  Body aches: yes Swollen glands: yes Sinus pressure: no Headache: no Face pain: no Toothache: no Ear pain: no  Ear pressure: no  Eyes red/itching:no Eye drainage/crusting: no  Nausea: no  Vomiting: no Diarrhea: yes  Change in appetite: no  Loss of taste/smell: no  Rash: no Fatigue: yes Sick contacts: yes; Zoe was sick first - goes to school Strep contacts: no  Context: fluctuating Recurrent sinusitis: no Treatments attempted: Tylenol, tea with honey and lemon, guaifenesin Relief with OTC medications: yes  No Known Allergies  Outpatient Encounter Medications as of 12/25/2020  Medication Sig  . acetaminophen (TYLENOL) 500 MG tablet Take 1,000 mg by mouth every 6 (six) hours as needed for mild pain or headache.  . Continuous Blood Gluc Sensor (FREESTYLE LIBRE 14 DAY SENSOR) MISC 1 each by Does not apply route every 14 (fourteen) days.  Marland Kitchen ibuprofen (ADVIL) 800 MG tablet Take 1 tablet (800 mg total) by mouth every 8 (eight) hours as needed.  . metFORMIN (GLUCOPHAGE XR) 500 MG 24 hr tablet  Take 1 tablet (500 mg total) by mouth 2 (two) times daily.  . Prenatal Vit-Fe Fumarate-FA (PRENATAL MULTIVITAMIN) TABS tablet Take 1 tablet by mouth daily at 12 noon.  . [DISCONTINUED] sodium chloride (OCEAN) 0.65 % SOLN nasal spray Place 1 spray into both nostrils as needed for congestion.  . sodium chloride (OCEAN) 0.65 % SOLN nasal spray Place 1 spray into both nostrils as needed for congestion.   No facility-administered encounter medications on file as of 12/25/2020.    Patient Active Problem List   Diagnosis Date Noted  . Status post cesarean delivery 12/28/2019  . Postpartum care following cesarean delivery 12/28/2019  . Term pregnancy 12/28/2019  . Depression, major, single episode, mild (HCC) 03/23/2018  . COPD (chronic obstructive pulmonary disease) (HCC) 06/18/2017  . Carpal tunnel syndrome 06/18/2017  . Eczema, dyshidrotic 07/29/2016  . GAD (generalized anxiety disorder) 02/22/2016  . Diabetes mellitus type 2, uncomplicated (HCC) 02/22/2016  . Chest pain on exertion 05/02/2013  . Obstructive sleep apnea 05/02/2013  . Obesity 05/02/2013  . Tobacco abuse 05/02/2013    Past Medical History:  Diagnosis Date  . Abdominal pain   . Anxiety   . Bipolar disorder (HCC)   . Diabetes mellitus without complication (HCC)    type 2   . Headache   . High cholesterol   . Hyperlipidemia   . Kidney stones   . Status post cesarean delivery 12/28/2019    Relevant past medical, surgical, family and social history reviewed and updated as indicated. Interim medical history since our last visit reviewed.  Review of Systems  Per HPI unless specifically indicated above     Objective:    There were no vitals taken for this visit.  Wt Readings from Last 3 Encounters:  02/16/20 168 lb (76.2 kg)  12/28/19 193 lb (87.5 kg)  12/16/19 192 lb (87.1 kg)    Physical Exam Vitals and nursing note reviewed.  Constitutional:      General: She is not in acute distress.    Appearance: Normal  appearance. She is not toxic-appearing.  HENT:     Head: Normocephalic and atraumatic.     Right Ear: External ear normal.     Left Ear: External ear normal.     Nose: Nose normal. No congestion or rhinorrhea.     Mouth/Throat:     Mouth: Mucous membranes are moist.     Pharynx: Oropharynx is clear.  Eyes:     General: No scleral icterus.    Extraocular Movements: Extraocular movements intact.  Cardiovascular:     Comments: Unable to assess heart sounds via virtual visit Pulmonary:     Effort: Pulmonary effort is normal. No respiratory distress.     Comments: Unable to assess lung sounds via virtual visit; patient talking in complete sentence during telemedicine visit with infrequent congested cough Skin:    Coloration: Skin is not jaundiced or pale.     Findings: No erythema.  Neurological:     Mental Status: She is alert and oriented to person, place, and time.  Psychiatric:        Mood and Affect: Mood normal.        Behavior: Behavior normal.        Thought Content: Thought content normal.        Judgment: Judgment normal.       Assessment & Plan:  1. Upper respiratory tract infection, unspecified type Acute.  Reassured patient that symptoms and exam findings are most consistent with a viral upper respiratory infection and explained lack of efficacy of antibiotics against viruses.  Discussed expected course and features suggestive of secondary bacterial infection.  Continue supportive care. Increase fluid intake with water or electrolyte solution like pedialyte. Encouraged acetaminophen as needed for fever/pain.  Encouraged saline sinus flushes and/or neti with humidified air.  If symptoms do not continue to gradually improve over next week, return to clinic for re-evaluation.   2. Nasal congestion  - sodium chloride (OCEAN) 0.65 % SOLN nasal spray; Place 1 spray into both nostrils as needed for congestion.  Dispense: 88 mL; Refill: 0     Follow up plan: Return if  symptoms worsen or fail to improve.  Due to the catastrophic nature of the COVID-19 pandemic, this video visit was completed soley via audio and visual contact via [facility] due to the restrictions of the COVID-19 pandemic.  All issues as above were discussed and addressed. Physical exam was done as above through visual confirmation on [employer]. If it was felt that the patient should be evaluated in the office, they were directed there. The patient verbally consented to this visit. . Location of the patient: home . Location of the provider: work . Those involved with this call:  . Provider: Cathlean Marseilles, DNP, FNP-C . CMA: Moises Blood, CMA . Front Desk/Registration: Claudine Mouton  . Time spent on call: 14 minutes with patient face to face via video conference. More than 50% of this time was spent in counseling and coordination of care. 20 minutes total spent in review of patient's record and preparation  of their chart.  I verified patient identity using two factors (patient name and date of birth). Patient consents verbally to being seen via telemedicine visit today.

## 2020-12-27 ENCOUNTER — Encounter: Payer: Self-pay | Admitting: Nurse Practitioner

## 2021-02-26 ENCOUNTER — Telehealth: Payer: Self-pay

## 2021-02-26 ENCOUNTER — Telehealth (INDEPENDENT_AMBULATORY_CARE_PROVIDER_SITE_OTHER): Payer: No Typology Code available for payment source | Admitting: Nurse Practitioner

## 2021-02-26 ENCOUNTER — Other Ambulatory Visit: Payer: Self-pay

## 2021-02-26 NOTE — Telephone Encounter (Signed)
Spoke with pt, pt says she has been having excessive bleeding, clotting and cramping for several days. Believes she is having a miscarriage. Pt was instructed to go to the ER ASAP

## 2021-02-27 NOTE — Progress Notes (Signed)
Erroneous encounter

## 2021-03-03 ENCOUNTER — Other Ambulatory Visit: Payer: Self-pay | Admitting: Obstetrics and Gynecology

## 2021-03-05 ENCOUNTER — Other Ambulatory Visit (HOSPITAL_COMMUNITY): Payer: Self-pay

## 2021-03-06 ENCOUNTER — Other Ambulatory Visit (HOSPITAL_COMMUNITY): Payer: Self-pay

## 2021-03-06 MED ORDER — PRENATAL 27-0.8 MG PO TABS: ORAL_TABLET | ORAL | 1 refills | Status: DC

## 2021-03-07 ENCOUNTER — Ambulatory Visit (INDEPENDENT_AMBULATORY_CARE_PROVIDER_SITE_OTHER): Payer: No Typology Code available for payment source | Admitting: Nurse Practitioner

## 2021-03-07 ENCOUNTER — Other Ambulatory Visit: Payer: Self-pay

## 2021-03-07 ENCOUNTER — Encounter: Payer: Self-pay | Admitting: Nurse Practitioner

## 2021-03-07 VITALS — Ht 61.0 in | Wt 178.4 lb

## 2021-03-07 DIAGNOSIS — R200 Anesthesia of skin: Secondary | ICD-10-CM

## 2021-03-07 DIAGNOSIS — E119 Type 2 diabetes mellitus without complications: Secondary | ICD-10-CM

## 2021-03-07 DIAGNOSIS — R42 Dizziness and giddiness: Secondary | ICD-10-CM | POA: Diagnosis not present

## 2021-03-07 DIAGNOSIS — R0789 Other chest pain: Secondary | ICD-10-CM

## 2021-03-07 DIAGNOSIS — O09529 Supervision of elderly multigravida, unspecified trimester: Secondary | ICD-10-CM | POA: Insufficient documentation

## 2021-03-07 DIAGNOSIS — F411 Generalized anxiety disorder: Secondary | ICD-10-CM

## 2021-03-07 DIAGNOSIS — Z720 Tobacco use: Secondary | ICD-10-CM

## 2021-03-07 NOTE — Progress Notes (Signed)
Subjective:    Patient ID: Andrea [name], female    DOB: 1981/04/07, 40 y.o.   MRN: [id number]  HPI: Andrea [name] is a 40 y.o. female presenting with multiple concerns.  Chief Complaint  Patient presents with  . Dizziness    Chest tightness and numbness in the hand and dizzyness   DIZZINESS Patient reports dizziness for the past week.  She also endorses increased stress and anxiety at home.  She has previously taken Lexapro and tolerated well and helped significantly with her anxiety, however stopped taking due to decreased libido.  Of note, patient is currently trying to conceive and is nervous about starting any medicine that may be harmful to the fetus.  Patient does have a history of early coronary artery disease in her family.  She has previously been evaluated by cardiology for similar symptoms and had a stress test years ago.  She was also diagnosed with sleep apnea-is not currently wearing her CPAP.  She is also currently smoking cigarettes and desires to quit, however is not ready to at this time due to stress levels. Duration: week Description of symptoms: lightheaded; feels like she is going to pass out Duration of episode:5-10 minutes Dizziness frequency: multiple times per day; 2-4 times daily Aggravating/provoking factors:  worse when upset, sometimes comes on before panic attack Triggered by rolling over in bed: no Triggered by bending over: yes Aggravated by head movement: yes  Changing positions: yes Aggravated by exertion: no Aggravated by coughing: no Aggravated by loud noises: no Recent head injury: no  Headaches: yes and more frequent Recent or current viral symptoms: no History of vasovagal episodes: no Nausea: yes Vomiting: no Tinnitus: no Hearing loss: no Aural fullness: yes Headache: yes Photophobia:no Phonophobia: no Unsteady gait: no Postural instability: yes; feels like she is going to pass out Diplopia: no Dysarthria: no Dysphagia:  no Weakness: no Related to exertion: no Pallor: no Diaphoresis: no Dyspnea: yes Chest pain: yes; sometimes -pressure/uncomfortable feeling in chsest  NUMBNESS IN HANDS Patient reports she was told she has carpal tunnel in the past. Duration: months - years Onset: sudden; when she wakes up from sleeping Location:  hands Bilateral: yes Symmetric: yes Decreased sensation: yes  Weakness: no Pain: yes Quality: numbness/tingling/electric Severity: moderate  Frequency: Nightly Trauma: no Recent illness: no Diabetes: yes Thyroid disease: no  HIV: no  Alcoholism: no  Spinal cord injury: no Alleviating factors: Nothing Aggravating factors: Sleep-laying on, overuse Status: stable Treatments attempted: Nothing  Allergies  Allergen Reactions  . Wellbutrin [Bupropion] Other (See Comments)    Suicidality    Outpatient Encounter Medications as of 03/07/2021  Medication Sig  . acetaminophen (TYLENOL) 500 MG tablet Take 1,000 mg by mouth every 6 (six) hours as needed for mild pain or headache.  . Continuous Blood Gluc Sensor (FREESTYLE LIBRE 14 DAY SENSOR) MISC 1 each by Does not apply route every 14 (fourteen) days.  Marland Kitchen ibuprofen (ADVIL) 800 MG tablet Take 1 tablet (800 mg total) by mouth every 8 (eight) hours as needed.  . metFORMIN (GLUCOPHAGE-XR) 500 MG 24 hr tablet TAKE 1 TABLET BY MOUTH 2 TIMES DAILY.  Marland Kitchen Prenatal Vit-Fe Fumarate-FA (PRENATAL MULTIVITAMIN) TABS tablet Take 1 tablet by mouth daily at 12 noon.  . sodium chloride (OCEAN) 0.65 % SOLN nasal spray Place 1 spray into both nostrils as needed for congestion.  . [DISCONTINUED] Prenatal Vit-Fe Fumarate-FA (MULTIVITAMIN-PRENATAL) 27-0.8 MG TABS tablet Take 1 tablet by  mouth once a day at 12  PM  . [DISCONTINUED] sodium chloride (OCEAN) 0.65 % nasal spray PLACE 1 SPRAY INTO BOTH NOSTRILS AS NEEDED FOR CONGESTION.  Marland Kitchen Continuous Blood Gluc Sensor (FREESTYLE LIBRE 14 DAY SENSOR) MISC See admin instructions.   No  facility-administered encounter medications on file as of 03/07/2021.    Patient Active Problem List   Diagnosis Date Noted  . Maternal age 40+, multigravida, antepartum 03/07/2021  . Status post cesarean delivery 12/28/2019  . Postpartum care following cesarean delivery 12/28/2019  . Term pregnancy 12/28/2019  . Pre-existing type 2 diabetes mellitus during pregnancy, antepartum 09/16/2019  . Depression, major, single episode, mild (HCC) 03/23/2018  . COPD (chronic obstructive pulmonary disease) (HCC) 06/18/2017  . Carpal tunnel syndrome 06/18/2017  . Eczema, dyshidrotic 07/29/2016  . GAD (generalized anxiety disorder) 02/22/2016  . Diabetes mellitus type 2, uncomplicated (HCC) 02/22/2016  . Chest pain on exertion 05/02/2013  . Obstructive sleep apnea 05/02/2013  . Obesity 05/02/2013  . Tobacco abuse 05/02/2013    Past Medical History:  Diagnosis Date  . Abdominal pain   . Anxiety   . Bipolar disorder (HCC)   . Diabetes mellitus without complication (HCC)    type 2   . Headache   . High cholesterol   . Hyperlipidemia   . Kidney stones   . Status post cesarean delivery 12/28/2019    Relevant past medical, surgical, family and social history reviewed and updated as indicated. Interim medical history since our last visit reviewed.  Review of Systems Per HPI unless specifically indicated above     Objective:    Ht 5\' 1"  (1.549 m)   Wt 178 lb 6.4 oz (80.9 kg)   SpO2 100%   BMI 33.71 kg/m   Wt Readings from Last 3 Encounters:  03/07/21 178 lb 6.4 oz (80.9 kg)  02/16/20 168 lb (76.2 kg)  12/28/19 193 lb (87.5 kg)    Orthostatic vital signs: Lying: Blood pressure-104/67 heart rate-105 Standing: Blood pressure-124/70 heart rate-110 Standing at 2 minutes: Blood pressure-110/84 heart rate-110  Physical Exam Vitals and nursing note reviewed.  Constitutional:      General: She is not in acute distress.    Appearance: Normal appearance. She is obese. She is not  toxic-appearing.  HENT:     Head: Normocephalic and atraumatic.     Right Ear: Tympanic membrane, ear canal and external ear normal.     Left Ear: Tympanic membrane, ear canal and external ear normal.     Nose: Nose normal. No congestion.     Mouth/Throat:     Mouth: Mucous membranes are moist.     Pharynx: Oropharynx is clear.  Eyes:     General: No scleral icterus.    Extraocular Movements: Extraocular movements intact.  Cardiovascular:     Rate and Rhythm: Regular rhythm. Tachycardia present.     Heart sounds: Normal heart sounds. No murmur heard.   Pulmonary:     Effort: Pulmonary effort is normal. No respiratory distress.     Breath sounds: Normal breath sounds. No wheezing, rhonchi or rales.  Abdominal:     General: Abdomen is flat. Bowel sounds are normal. There is no distension.     Palpations: Abdomen is soft. There is no mass.  Musculoskeletal:        General: Normal range of motion.     Right lower leg: No edema.     Left lower leg: No edema.  Skin:    General: Skin is warm and dry.     Capillary  Refill: Capillary refill takes less than 2 seconds.     Coloration: Skin is not jaundiced or pale.     Findings: No erythema.  Neurological:     Mental Status: She is alert and oriented to person, place, and time.     Motor: No weakness.     Gait: Gait normal.       Assessment & Plan:  1. Dizziness Acute.  Unclear etiology, although differentials include anxiety, panic, orthostatic hypotension, hormone imbalance, anemia, coronary etiology.  EKG in the office today similar when compared with most recent EKG in 2019.  We will check blood work.  Orthostatic vital signs not indicative of orthostasis today.  Question stress component.  Given early coronary artery disease, some chest tightness with shortness of breath and previous evaluation with cardiologist, will replace urgent referral to cardiologist for further possible work-up.  No chest pain or shortness of breath today  during examination- encouraged patient to seek emergent care if this returns.  - TSH; Future - Ambulatory referral to Cardiology  2. Chest tightness Acute.  Unclear etiology, although differentials include anxiety, panic, orthostatic hypotension, hormone imbalance, anemia, coronary etiology.  EKG in the office today similar when compared with most recent EKG in 2019.  We will check blood work.  Orthostatic vital signs not indicative of orthostasis today.  Question stress component.  Given early coronary artery disease, some chest tightness with shortness of breath and previous evaluation with cardiologist, will replace urgent referral to cardiologist for further possible work-up.  No chest pain or shortness of breath today during examination- encouraged patient to seek emergent care if this returns.  - EKG 12-Lead - Lipid Panel; Future - CBC with Differential; Future - COMPLETE METABOLIC PANEL WITH GFR; Future - TSH; Future - Ambulatory referral to Cardiology  3. Hand numbness Positive Phalen test-likely carpal tunnel syndrome.  Encouraged use of wrist braces at nighttime to help with symptoms.  Will likely need referral to hand specialist for definitive treatment.  4. Type 2 diabetes mellitus without complication, without long-term current use of insulin (HCC) Chronic.  Patient has not been checking blood sugar, but has been taking metformin XR 500 mg twice daily.  We will check hemoglobin A1c today.  Encouraged refocusing on limiting carbohydrates and simple sugars.  - Hemoglobin A1c; Future  5. Tobacco abuse Chronic.  Patient is in precontemplation phase.  Not interested in quitting at this time due to increased stress level.  Strongly encouraged smoking cessation given history of early coronary artery disease and recent increase in shortness of breath and chest tightness.  EKG today similar when compared with EKG in 2019.  Urgent referral placed to cardiology.  Encouraged to seek emergent  care with return of chest pain or shortness of breath.  6. GAD (generalized anxiety disorder) Chronic.  Patient not willing to resume escitalopram even though she had good response in the past due to decreased libido.  She is currently trying to conceive.  Reviewed medications with her that are safe in pregnancy- buspirone pregnancy category B.  Will start trial of buspirone but patient to check with OB/GYN in the next couple weeks to ensure this is safe for her during potential pregnancy.  - CBC with Differential; Future - TSH; Future    Follow up plan: Return in about 1 month (around 04/07/2021).

## 2021-03-08 ENCOUNTER — Other Ambulatory Visit: Payer: No Typology Code available for payment source

## 2021-03-08 DIAGNOSIS — R42 Dizziness and giddiness: Secondary | ICD-10-CM

## 2021-03-08 DIAGNOSIS — R0789 Other chest pain: Secondary | ICD-10-CM

## 2021-03-08 DIAGNOSIS — E119 Type 2 diabetes mellitus without complications: Secondary | ICD-10-CM

## 2021-03-08 DIAGNOSIS — F411 Generalized anxiety disorder: Secondary | ICD-10-CM

## 2021-03-09 ENCOUNTER — Other Ambulatory Visit (HOSPITAL_COMMUNITY): Payer: Self-pay

## 2021-03-09 LAB — COMPLETE METABOLIC PANEL WITH GFR
AG Ratio: 1.9 (calc) (ref 1.0–2.5)
ALT: 15 U/L (ref 6–29)
AST: 14 U/L (ref 10–30)
Albumin: 4.4 g/dL (ref 3.6–5.1)
Alkaline phosphatase (APISO): 89 U/L (ref 31–125)
BUN: 12 mg/dL (ref 7–25)
CO2: 25 mmol/L (ref 20–32)
Calcium: 9.3 mg/dL (ref 8.6–10.2)
Chloride: 102 mmol/L (ref 98–110)
Creat: 0.69 mg/dL (ref 0.50–1.10)
GFR, Est African American: 125 mL/min/{1.73_m2} (ref >=60)
GFR, Est Non African American: 108 mL/min/{1.73_m2} (ref >=60)
Globulin: 2.3 g/dL (calc) (ref 1.9–3.7)
Glucose, Bld: 128 mg/dL — ABNORMAL HIGH (ref 65–99)
Potassium: 4.8 mmol/L (ref 3.5–5.3)
Sodium: 138 mmol/L (ref 135–146)
Total Bilirubin: 0.3 mg/dL (ref 0.2–1.2)
Total Protein: 6.7 g/dL (ref 6.1–8.1)

## 2021-03-09 LAB — CBC WITH DIFFERENTIAL/PLATELET
Absolute Monocytes: 737 cells/uL (ref 200–950)
Basophils Absolute: 77 cells/uL (ref 0–200)
Basophils Relative: 0.7 %
Eosinophils Absolute: 308 cells/uL (ref 15–500)
Eosinophils Relative: 2.8 %
HCT: 44 % (ref 35.0–45.0)
Hemoglobin: 14.8 g/dL (ref 11.7–15.5)
Lymphs Abs: 2541 cells/uL (ref 850–3900)
MCH: 30.5 pg (ref 27.0–33.0)
MCHC: 33.6 g/dL (ref 32.0–36.0)
MCV: 90.5 fL (ref 80.0–100.0)
MPV: 11.6 fL (ref 7.5–12.5)
Monocytes Relative: 6.7 %
Neutro Abs: 7337 cells/uL (ref 1500–7800)
Neutrophils Relative %: 66.7 %
Platelets: 270 10*3/uL (ref 140–400)
RBC: 4.86 10*6/uL (ref 3.80–5.10)
RDW: 11.8 % (ref 11.0–15.0)
Total Lymphocyte: 23.1 %
WBC: 11 10*3/uL — ABNORMAL HIGH (ref 3.8–10.8)

## 2021-03-09 LAB — LIPID PANEL
Cholesterol: 217 mg/dL — ABNORMAL HIGH (ref <200)
HDL: 36 mg/dL — ABNORMAL LOW (ref >=50)
LDL Cholesterol (Calc): 147 mg/dL (calc) — ABNORMAL HIGH
Non-HDL Cholesterol (Calc): 181 mg/dL (calc) — ABNORMAL HIGH (ref <130)
Total CHOL/HDL Ratio: 6 (calc) — ABNORMAL HIGH (ref <5.0)
Triglycerides: 207 mg/dL — ABNORMAL HIGH (ref <150)

## 2021-03-09 LAB — HEMOGLOBIN A1C
Hgb A1c MFr Bld: 6 % of total Hgb — ABNORMAL HIGH (ref <5.7)
Mean Plasma Glucose: 126 mg/dL
eAG (mmol/L): 7 mmol/L

## 2021-03-09 LAB — TSH: TSH: 1.31 mIU/L

## 2021-03-09 MED ORDER — BUSPIRONE HCL 5 MG PO TABS
5.0000 mg | ORAL_TABLET | Freq: Two times a day (BID) | ORAL | 1 refills | Status: DC
  Filled 2021-03-09: qty 60, 30d supply, fill #0

## 2021-03-15 ENCOUNTER — Encounter: Payer: Self-pay | Admitting: Nurse Practitioner

## 2021-03-17 ENCOUNTER — Other Ambulatory Visit (HOSPITAL_COMMUNITY): Payer: Self-pay

## 2021-03-29 ENCOUNTER — Other Ambulatory Visit: Payer: Self-pay

## 2021-03-30 ENCOUNTER — Ambulatory Visit: Payer: No Typology Code available for payment source | Admitting: Internal Medicine

## 2021-04-03 ENCOUNTER — Encounter: Payer: Self-pay | Admitting: Internal Medicine

## 2021-04-03 ENCOUNTER — Other Ambulatory Visit: Payer: Self-pay

## 2021-04-03 ENCOUNTER — Other Ambulatory Visit (HOSPITAL_COMMUNITY): Payer: Self-pay

## 2021-04-03 ENCOUNTER — Ambulatory Visit (INDEPENDENT_AMBULATORY_CARE_PROVIDER_SITE_OTHER): Payer: No Typology Code available for payment source | Admitting: Internal Medicine

## 2021-04-03 VITALS — BP 110/80 | HR 105 | Temp 98.1°F | Ht 61.0 in | Wt 181.8 lb

## 2021-04-03 DIAGNOSIS — F411 Generalized anxiety disorder: Secondary | ICD-10-CM | POA: Diagnosis not present

## 2021-04-03 DIAGNOSIS — E66811 Obesity, class 1: Secondary | ICD-10-CM

## 2021-04-03 DIAGNOSIS — E669 Obesity, unspecified: Secondary | ICD-10-CM

## 2021-04-03 DIAGNOSIS — Z720 Tobacco use: Secondary | ICD-10-CM | POA: Diagnosis not present

## 2021-04-03 DIAGNOSIS — E785 Hyperlipidemia, unspecified: Secondary | ICD-10-CM

## 2021-04-03 DIAGNOSIS — E1169 Type 2 diabetes mellitus with other specified complication: Secondary | ICD-10-CM | POA: Diagnosis not present

## 2021-04-03 MED ORDER — ATORVASTATIN CALCIUM 40 MG PO TABS
40.0000 mg | ORAL_TABLET | Freq: Every day | ORAL | 1 refills | Status: DC
  Filled 2021-04-03: qty 90, 90d supply, fill #0

## 2021-04-03 MED FILL — Metformin HCl Tab ER 24HR 500 MG: ORAL | 90 days supply | Qty: 180 | Fill #0 | Status: AC

## 2021-04-03 NOTE — Patient Instructions (Signed)
-  Nice seeing you today!!  -Start lipitor 40 mg at bedtime.  -Schedule follow up in 3 months. Please come in fasting for labs.

## 2021-04-03 NOTE — Progress Notes (Signed)
Established Patient Office Visit     This visit occurred during the SARS-CoV-2 public health emergency.  Safety protocols were in place, including screening questions prior to the visit, additional usage of staff PPE, and extensive cleaning of exam room while observing appropriate contact time as indicated for disinfecting solutions.    CC/Reason for Visit: Establish care, discuss chronic medical conditions  HPI: Andrea [name] is a 40 y.o. female who is coming in today for the above mentioned reasons. Past Medical History is significant for: Obesity, hyperlipidemia, type 2 diabetes, generalized anxiety disorder, ongoing nicotine dependence.  She is transferring care from [facility] family practice.  She has a strong family history of coronary artery disease with her mother having died at age 27, paternal grandfather and paternal uncle having had an MI in their 49s and a maternal uncle having passed from coronary artery disease in his 40s.  Her past surgical history significant for a cholecystectomy and a C-section.  She has 3 children, she is married, she is a stay-at-home mother.  She has smoked 1 pack a day for over 30 years.  She drinks alcohol only occasionally, she has no known drug allergies.  Her previous primary care provider has referred her to cardiology given her family history and some ongoing episodes of dizziness which patient states have improved.  She was prescribed buspirone for anxiety but decided not to take.   Past Medical/Surgical History: Past Medical History:  Diagnosis Date   Abdominal pain    Anxiety    Bipolar disorder (HCC)    Diabetes mellitus without complication (HCC)    type 2    Headache    High cholesterol    Hyperlipidemia    Kidney stones    Status post cesarean delivery 12/28/2019    Past Surgical History:  Procedure Laterality Date   CESAREAN SECTION N/A 12/28/2019   Procedure: CESAREAN SECTION;  Surgeon: Edwinna Areola, DO;   Location: [facility];  Service: Obstetrics;  Laterality: N/A;  [name], RNFA   CHOLECYSTECTOMY  08/05/11   KIDNEY STONE SURGERY      Social History:  reports that she has been smoking cigarettes. She has a 19.00 pack-year smoking history. She has never used smokeless tobacco. She reports previous alcohol use. She reports that she does not use drugs.  Allergies: Allergies  Allergen Reactions   Wellbutrin [Bupropion] Other (See Comments)    Suicidality    Family History:  Family History  Problem Relation Age of Onset   Heart attack Mother    Early death Mother    Depression Mother    Heart disease Mother    Hyperlipidemia Mother    Heart disease Father    Depression Father    Heart disease Maternal Uncle    Heart attack Maternal Uncle    Heart attack Paternal Aunt        During a stress test   Heart attack Paternal Uncle    Early death Paternal Uncle    Heart attack Paternal Grandfather    Early death Paternal Grandfather    Heart disease Paternal Grandfather    Hyperlipidemia Paternal Grandfather    Heart disease Maternal Grandmother    Miscarriages / Stillbirths Paternal Grandmother      Current Outpatient Medications:    acetaminophen (TYLENOL) 500 MG tablet, Take 1,000 mg by mouth every 6 (six) hours as needed for mild pain or headache., Disp: , Rfl:    atorvastatin (LIPITOR) 40 MG tablet,  Take 1 tablet (40 mg total) by mouth daily., Disp: 90 tablet, Rfl: 1   ibuprofen (ADVIL) 800 MG tablet, Take 1 tablet (800 mg total) by mouth every 8 (eight) hours as needed., Disp: 100 tablet, Rfl: 3   metFORMIN (GLUCOPHAGE-XR) 500 MG 24 hr tablet, TAKE 1 TABLET BY MOUTH 2 TIMES DAILY., Disp: 180 tablet, Rfl: 2   Prenatal Vit-Fe Fumarate-FA (PRENATAL MULTIVITAMIN) TABS tablet, Take 1 tablet by mouth daily at 12 noon., Disp: 100 tablet, Rfl: 3  Review of Systems:  Constitutional: Denies fever, chills, diaphoresis, appetite change and fatigue.  HEENT: Denies photophobia, eye pain,  redness, hearing loss, ear pain, congestion, sore throat, rhinorrhea, sneezing, mouth sores, trouble swallowing, neck pain, neck stiffness and tinnitus.   Respiratory: Denies SOB, DOE, cough, chest tightness,  and wheezing.   Cardiovascular: Denies chest pain, palpitations and leg swelling.  Gastrointestinal: Denies nausea, vomiting, abdominal pain, diarrhea, constipation, blood in stool and abdominal distention.  Genitourinary: Denies dysuria, urgency, frequency, hematuria, flank pain and difficulty urinating.  Endocrine: Denies: hot or cold intolerance, sweats, changes in hair or nails, polyuria, polydipsia. Musculoskeletal: Denies myalgias, back pain, joint swelling, arthralgias and gait problem.  Skin: Denies pallor, rash and wound.  Neurological: Denies dizziness, seizures, syncope, weakness, light-headedness, numbness and headaches.  Hematological: Denies adenopathy. Easy bruising, personal or family bleeding history  Psychiatric/Behavioral: Denies suicidal ideation, mood changes, confusion, nervousness, sleep disturbance and agitation    Physical Exam: Vitals:   04/03/21 0732  BP: 110/80  Pulse: (!) 105  Temp: 98.1 F (36.7 C)  TempSrc: Oral  SpO2: 97%  Weight: 181 lb 12.8 oz (82.5 kg)  Height: 5\' 1"  (1.549 m)    Body mass index is 34.35 kg/m.   Constitutional: NAD, calm, comfortable Eyes: PERRL, lids and conjunctivae normal ENMT: Mucous membranes are moist.  Respiratory: clear to auscultation bilaterally, no wheezing, no crackles. Normal respiratory effort. No accessory muscle use.  Cardiovascular: Regular rate and rhythm, no murmurs / rubs / gallops. No extremity edema. Neurologic: Grossly intact and nonfocal Psychiatric: Normal judgment and insight. Alert and oriented x 3. Normal mood.    Impression and Plan:  Tobacco abuse -We have not had time to address this today, will need to discuss smoking cessation at subsequent visits.  Type 2 diabetes mellitus with  other specified complication, without long-term current use of insulin (HCC) -Well-controlled with a recent A1c of 6.0 in May 2022. -She is on metformin twice daily. -Schedule follow-up in 3 months.  Obesity (BMI 30.0-34.9) -Discussed healthy lifestyle, including increased physical activity and better food choices to promote weight loss.  GAD (generalized anxiety disorder) -She is currently not on medication, feels like her anxiety is controlled.  Hyperlipidemia associated with type 2 diabetes mellitus (HCC)  - Plan: atorvastatin (LIPITOR) 40 MG tablet -Lipid panel from May 2022 with total cholesterol 217, triglycerides 207 and LDL 147, goal LDL less than 70. -I will start her on Lipitor 40 mg, recheck lipids in 3 months.    Patient Instructions  -Nice seeing you today!!  -Start lipitor 40 mg at bedtime.  -Schedule follow up in 3 months. Please come in fasting for labs.    June 2022, MD [facility]

## 2021-04-13 ENCOUNTER — Other Ambulatory Visit: Payer: Self-pay

## 2021-04-13 ENCOUNTER — Ambulatory Visit (INDEPENDENT_AMBULATORY_CARE_PROVIDER_SITE_OTHER): Payer: No Typology Code available for payment source

## 2021-04-13 ENCOUNTER — Ambulatory Visit (INDEPENDENT_AMBULATORY_CARE_PROVIDER_SITE_OTHER): Payer: No Typology Code available for payment source | Admitting: Cardiology

## 2021-04-13 ENCOUNTER — Encounter: Payer: Self-pay | Admitting: Cardiology

## 2021-04-13 VITALS — BP 110/70 | HR 88 | Resp 18 | Ht 61.0 in | Wt 181.4 lb

## 2021-04-13 DIAGNOSIS — Z720 Tobacco use: Secondary | ICD-10-CM

## 2021-04-13 DIAGNOSIS — R0789 Other chest pain: Secondary | ICD-10-CM

## 2021-04-13 DIAGNOSIS — R000 Tachycardia, unspecified: Secondary | ICD-10-CM | POA: Insufficient documentation

## 2021-04-13 DIAGNOSIS — F411 Generalized anxiety disorder: Secondary | ICD-10-CM | POA: Diagnosis not present

## 2021-04-13 DIAGNOSIS — E6609 Other obesity due to excess calories: Secondary | ICD-10-CM

## 2021-04-13 DIAGNOSIS — R42 Dizziness and giddiness: Secondary | ICD-10-CM

## 2021-04-13 DIAGNOSIS — R002 Palpitations: Secondary | ICD-10-CM

## 2021-04-13 DIAGNOSIS — Z6834 Body mass index (BMI) 34.0-34.9, adult: Secondary | ICD-10-CM

## 2021-04-13 DIAGNOSIS — G4733 Obstructive sleep apnea (adult) (pediatric): Secondary | ICD-10-CM

## 2021-04-13 DIAGNOSIS — J449 Chronic obstructive pulmonary disease, unspecified: Secondary | ICD-10-CM

## 2021-04-13 NOTE — Progress Notes (Signed)
Primary Care Provider: Philip Aspen, Limmie Patricia, MD Cardiologist: Bryan Lemma, MD Electrophysiologist: None  Clinic Note: Chief Complaint  Patient presents with   New Patient (Initial Visit)    Has not been seen by cardiology in 8 years   Dizziness    Intermittent dizzy spells off and on for the last few months.   Tachycardia    Intermittent episodes of feeling her heart rate racing-often associated with shortness of breath and uncomfortable sensation in her chest as though she may be having a "panic attack ". ~1-5 min.   ===================================  ASSESSMENT/PLAN   Problem List Items Addressed This Visit     Dizzy spells    Tough to tell what really causing the spells.  It may or may not be associated with the palpitations.  Does not seem positional.  She has not had true syncope.  Orthostatics were negative at PCPs office.  Thyroid level and chemistry level normal.  Plan: 3-day Zio patch monitor to assess for palpitations, carotid Dopplers to evaluate actually for vertebral artery stenosis or even potentially subclavian stenosis causing subclavian steal.  Not really describing true exertional chest pain, so we will hold off on stress test for now.       Relevant Orders   EKG 12-Lead   LONG TERM MONITOR (3-14 DAYS)   VAS US CAROTID   Discomfort in chest    Discomfort and chest seems to be associated with fast heart rate spells and not without fast heart rate.  I suspect that she may be somewhat symptomatic with fast heart rates.  We will evaluate monitor, if any abnormal findings noted, with low threshold to consider we do a stress test.    Her last stress test was 8 years ago which was normal.  She is still very young, so unlikely.       Obstructive sleep apnea    Supposedly uses nasal mask.  Hopefully this will help her sleeping.       Tobacco abuse   GAD (generalized anxiety disorder)    I do think her episodes probably are related to panic  attacks.  We will evaluate with a Zio patch monitor but also her dizziness we can evaluate with a carotid Doppler to exclude vertebral artery disease.  If these tests are negative, would probably recommend consideration of restarting Prozac or BuSpar.       COPD (chronic obstructive pulmonary disease) (HCC)    I suspect some of her shortness of breath may be associated with having some tachycardia and baseline COPD.       Rapid heart rate - Primary    She is having these weird dizzy spells plus or minus conjunction with tachycardia spells.  Happening relatively frequently.  Plan: 3-day Zio patch monitor       Relevant Orders   EKG 12-Lead   LONG TERM MONITOR (3-14 DAYS)   VAS US CAROTID   Obesity (Chronic)    I am sure that some of the dyspnea can be related to deconditioning with obesity.  Thankfully, she is not having real exertional chest pain.  Discussed importance of weight loss.       Other Visit Diagnoses     Palpitations       Relevant Orders   EKG 12-Lead   LONG TERM MONITOR (3-14 DAYS)       ===================================  HPI:    Andrea [name] is a obese 40 y.o. female current smoker with a PMH notable for  DM-2, HLD, and GENERALIZED ANXIETY DISORDER with Strong Family History of CAD who is being seen today for the evaluation of DIZZY SPELLS, CHEST DISCOMFORT-RAPID HEART RATES, SHORTNESS OF BREATH at the request of Valentino NoseMartinez, Jessica A, NP.  Modesta MessingLauren Amick Word was last seen by Dr. Royann Shiversroitoru in October 2014 for tachycardia-and following up stress test for atypical chest pain symptoms..   -> Nuclear stress test was completely normal.  Sleep study showed mild OSA-was supposed to be scheduled for CPAP titration. Smoking station counseling provided and discussed nicotine patches as well as Chantix and/or Wellbutrin. ->  Provided Chantix Dosepak.  She was seen by Ms. Bradly BienenstockMartinez on Mar 07, 2021 with complaints of dizziness, shortness of breath,  irregular heartbeats and chest discomfort.  During the visit, she was not complaining of any active chest pain or shortness of breath.  Urgent cardiology consult placed. For history of panic attacks,Declined rate restarting Celexa.  Plan was to restart BuSpar-she just had declined to take it in the past.(Currently not listed). -> Orthostatics were negative; CMP, CBC, TSH and lipid panel ordered. Also noted to have symptoms consistent with carpal tunnel syndrome/hand numbness.  Recent Hospitalizations: None  Reviewed  CV studies:    The following studies were reviewed today: (if available, images/films reviewed: From Epic Chart or Care Everywhere) Myoview 05/07/2013: NORMAL STUDY.  Good exercise capacity with normal BP response.  No EKG changes consistent with ischemia.  Normal LV function (EF 60%) with no R WMA.  No Ischemia or Infarction   Interval History:   Modesta MessingLauren Amick Spidle presents here today with multiple, unusual complaints.  Dizzy spells that happen intermittently, off and on without any association with any particular activity either standing or sitting-not exacerbated with activity. ->  May or may not be related to the fast heart rate spells noted below.  She describes it as a sensation like a panic attack. Fast heart spells that may or may not be associated with dizziness.  Last about 1 to 5 minutes.  Do not seem irregular.  Associated with shortness of breath and fatigue -> Describes this as an uncomfortable discomfort in her chest, once or twice it is felt like "pain"  She indicates that she is short of breath at baseline, mostly because she is quite deconditioned.  She does not really notice chest tightness or pressure/discomfort with exertion.  Only with the fast heart rate spells.  She does not really describe the dizziness as being orthostatic in nature and she was not notably orthostatic at the PCPs office.  No associatedSyncope, but she has felt borderline near  syncope.  No PND, orthopnea or edema.  CV Review of Symptoms (Summary) Cardiovascular ROS: positive for - dyspnea on exertion, irregular heartbeat, palpitations, rapid heart rate, shortness of breath, and Intermittent dizziness, occasional chest discomfort associated with palpitations negative for - edema, loss of consciousness, orthopnea, paroxysmal nocturnal dyspnea, shortness of breath, or Syncope/near syncope or TIA/amaurosis fugax.  Claudication.  REVIEWED OF SYSTEMS   Review of Systems  Constitutional:  Positive for malaise/fatigue (Mostly related to obesity and lack of activity.). Negative for weight loss.  HENT:  Negative for nosebleeds.   Respiratory:  Positive for shortness of breath (Per HPI-associated with dizziness/heart rate spells.). Negative for cough.   Cardiovascular:        Per HPI  Gastrointestinal:  Negative for blood in stool and constipation.  Genitourinary:  Negative for dysuria and hematuria.  Musculoskeletal:  Negative for joint pain.       Wrist  pain-positive carpal tunnel.  Neurological:  Positive for dizziness and tingling (Also noted tingling in the right arm/hand related to carpal tunnel). Negative for headaches.       Per HPI  Psychiatric/Behavioral:  Positive for depression. Negative for memory loss. The patient is nervous/anxious and has insomnia.    I have reviewed and (if needed) personally updated the patient's problem list, medications, allergies, past medical and surgical history, social and family history.   PAST MEDICAL HISTORY   Past Medical History:  Diagnosis Date   Abdominal pain    Anxiety    Bipolar disorder (HCC)    Carpal tunnel syndrome on both sides    Diabetes mellitus without complication (HCC)    type 2    Headache    High cholesterol    Hyperlipidemia    Kidney stones    Status post cesarean delivery 12/28/2019    PAST SURGICAL HISTORY   Past Surgical History:  Procedure Laterality Date   CESAREAN SECTION N/A  12/28/2019   Procedure: CESAREAN SECTION;  Surgeon: Edwinna Areola, DO;  Location: [facility] LD ORS;  Service: Obstetrics;  Laterality: N/AHerbert Seta, RNFA   CHOLECYSTECTOMY  08/05/2011   KIDNEY STONE SURGERY     NM MYOVIEW LTD  05/07/2013   NORMAL STUDY.  Good exercise capacity with normal BP response.  No EKG changes consistent with ischemia.  Normal LV function (EF 60%) with no R WMA.  No Ischemia or Infarction --> LOW RISK    Immunization History  Administered Date(s) Administered   Influenza Split 09/23/2012   Influenza,Quad,Nasal, Live 07/30/2013   Influenza,inj,Quad PF,6+ Mos 07/29/2016, 07/18/2017, 07/27/2018, 06/10/2019, 08/17/2020   Influenza,inj,quad, With Preservative 06/15/2019   Influenza-Unspecified 07/15/2019   PFIZER(Purple Top)SARS-COV-2 Vaccination 01/09/2020, 02/01/2020, 09/01/2020   Tdap 05/31/2011, 10/14/2019    MEDICATIONS/ALLERGIES   Current Meds  Medication Sig   acetaminophen (TYLENOL) 500 MG tablet Take 1,000 mg by mouth every 6 (six) hours as needed for mild pain or headache.   folic acid (FOLVITE) 1 MG tablet Take 1 mg by mouth daily. Pt is taking 1000 mcg daily   ibuprofen (ADVIL) 800 MG tablet Take 1 tablet (800 mg total) by mouth every 8 (eight) hours as needed.   metFORMIN (GLUCOPHAGE-XR) 500 MG 24 hr tablet TAKE 1 TABLET BY MOUTH 2 TIMES DAILY.    Allergies  Allergen Reactions   Wellbutrin [Bupropion] Other (See Comments)    Suicidality    SOCIAL HISTORY/FAMILY HISTORY   Reviewed in Epic:  Pertinent findings:  Social History   Tobacco Use   Smoking status: Some Days    Packs/day: 1.00    Years: 19.00    Pack years: 19.00    Types: Cigarettes   Smokeless tobacco: Never  Vaping Use   Vaping Use: Never used  Substance Use Topics   Alcohol use: Not Currently    Alcohol/week: 0.0 standard drinks    Comment: occ   Drug use: No   Social History   Social History Narrative   Trained as a Associate Professor.   Lives with her husband and  both their 3 children.   She is a stay-at-home mom.   Smoker ~ 1 PPD x 30 yr.    Occasional EtOH only   No other drugs.    ~1 can of soda per day       She is admittedly is not very active.  Does not do routine exercise.    OBJCTIVE -PE, EKG, labs   Wt Readings from  Last 3 Encounters:  04/13/21 181 lb 6.4 oz (82.3 kg)  04/03/21 181 lb 12.8 oz (82.5 kg)  03/07/21 178 lb 6.4 oz (80.9 kg)    Physical Exam: BP 110/70 (BP Location: Left Arm, Patient Position: Sitting, Cuff Size: Normal)   Pulse 88   Resp 18   Ht 5\' 1"  (1.549 m)   Wt 181 lb 6.4 oz (82.3 kg)   SpO2 94%   BMI 34.28 kg/m  Physical Exam Constitutional:      General: She is not in acute distress.    Appearance: Normal appearance. She is obese. She is not ill-appearing or toxic-appearing.  HENT:     Head: Normocephalic and atraumatic.  Neck:     Vascular: No carotid bruit.  Cardiovascular:     Rate and Rhythm: Normal rate and regular rhythm.     Pulses: Normal pulses.     Heart sounds: Normal heart sounds. No murmur heard.   No friction rub. No gallop.  Pulmonary:     Effort: Pulmonary effort is normal. No respiratory distress.     Breath sounds: Normal breath sounds. No wheezing, rhonchi or rales.  Chest:     Chest wall: No tenderness.  Abdominal:     General: Abdomen is flat.     Palpations: Abdomen is soft.     Comments: Obese.  NABS.  Musculoskeletal:        General: Swelling present.     Cervical back: Normal range of motion and neck supple.  Skin:    General: Skin is warm and dry.     Coloration: Skin is not jaundiced or pale.  Neurological:     General: No focal deficit present.     Mental Status: She is alert and oriented to person, place, and time.     Motor: No weakness.     Gait: Gait normal.  Psychiatric:        Thought Content: Thought content normal.        Judgment: Judgment normal.     Comments: Seems to be quite anxious.     Adult ECG Report  Rate: 88 ;  Rhythm: normal sinus  rhythm and RSR' (~ Inc RBBB).  Otherwise normal axis and intervals and durations. ;   Narrative Interpretation: Borderline EKG.  Recent Labs:  Reviewed.  Very poorly controlled LDL.  Lab Results  Component Value Date   CHOL 217 (H) 03/08/2021   HDL 36 (L) 03/08/2021   LDLCALC 147 (H) 03/08/2021   LDLDIRECT 171.2 12/29/2009   TRIG 207 (H) 03/08/2021   CHOLHDL 6.0 (H) 03/08/2021   Lab Results  Component Value Date   CREATININE 0.69 03/08/2021   BUN 12 03/08/2021   NA 138 03/08/2021   K 4.8 03/08/2021   CL 102 03/08/2021   CO2 25 03/08/2021   CBC Latest Ref Rng & Units 03/08/2021 02/16/2020 12/29/2019  WBC 3.8 - 10.8 Thousand/uL 11.0(H) 10.4 10.1  Hemoglobin 11.7 - 15.5 g/dL 12/31/2019 92.7 63.9)  Hematocrit 35.0 - 45.0 % 44.0 40.6 27.1(L)  Platelets 140 - 400 Thousand/uL 270 320 170    Lab Results  Component Value Date   TSH 1.31 03/08/2021    ==================================================  COVID-19 Education: The signs and symptoms of COVID-19 were discussed with the patient and how to seek care for testing (follow up with PCP or arrange E-visit).    I spent a total of 21 minutes with the patient spent in direct patient consultation.  Additional time spent with chart review  /  charting (studies, outside notes, etc): 20 min Total Time: 41 min  Current medicines are reviewed at length with the patient today.  (+/- concerns) n/a  This visit occurred during the SARS-CoV-2 public health emergency.  Safety protocols were in place, including screening questions prior to the visit, additional usage of staff PPE, and extensive cleaning of exam room while observing appropriate contact time as indicated for disinfecting solutions.  Notice: This dictation was prepared with Dragon dictation along with smaller phrase technology. Any transcriptional errors that result from this process are unintentional and may not be corrected upon review.  Patient Instructions / Medication Changes &  Studies & Tests Ordered   Patient Instructions  Medication Instructions:  No changes  *If you need a refill on your cardiac medications before your next appointment, please call your pharmacy*   Lab Work: Not needed    Testing/Procedures: Will be mailed to your home - Your physician has recommended that you wear a holter monitor 3 day  Zio . Holter monitors are medical devices that record the heart's electrical activity. Doctors most often use these monitors to diagnose arrhythmias. Arrhythmias are problems with the speed or rhythm of the heartbeat. The monitor is a small, portable device. You can wear one while you do your normal daily activities. This is usually used to diagnose what is causing palpitations/syncope (passing out).  And Will be schedule at [address] [facility] [address] Your physician has requested that you have a carotid duplex. This test is an ultrasound of the carotid arteries in your neck. It looks at blood flow through these arteries that supply the brain with blood. Allow one hour for this exam. There are no restrictions or special instructions.   Follow-Up: At [facility], you and your health needs are our priority.  As part of our continuing mission to provide you with exceptional heart care, we have created designated Provider Care Teams.  These Care Teams include your primary Cardiologist (physician) and Advanced Practice Providers (APPs -  Physician Assistants and Nurse Practitioners) who all work together to provide you with the care you need, when you need it.     Your next appointment:   2 month(s)  The format for your next appointment:   Virtual   Provider:    Bryan Lemma, MD   Other Instructions  ZIO XT- Long Term Monitor Instructions  Studies Ordered:   Orders Placed This Encounter  Procedures   LONG TERM MONITOR (3-14 DAYS)   EKG 12-Lead   VAS US CAROTID     Bryan Lemma, M.D., M.S. Interventional Cardiologist   Pager #  [phone] Phone # [phone] [address] [place], [place] [address]   Thank you for choosing [facility] at [facility]!!

## 2021-04-13 NOTE — Progress Notes (Unsigned)
Enrolled patient for a 3 day Zio XT monitor to be mailed to patients home  

## 2021-04-13 NOTE — Patient Instructions (Addendum)
Medication Instructions:  No changes  *If you need a refill on your cardiac medications before your next appointment, please call your pharmacy*   Lab Work: Not needed    Testing/Procedures: Will be mailed to your home - Your physician has recommended that you wear a holter monitor 3 day  Zio . Holter monitors are medical devices that record the heart's electrical activity. Doctors most often use these monitors to diagnose arrhythmias. Arrhythmias are problems with the speed or rhythm of the heartbeat. The monitor is a small, portable device. You can wear one while you do your normal daily activities. This is usually used to diagnose what is causing palpitations/syncope (passing out).  And Will be schedule at [address] [facility] [address] Your physician has requested that you have a carotid duplex. This test is an ultrasound of the carotid arteries in your neck. It looks at blood flow through these arteries that supply the brain with blood. Allow one hour for this exam. There are no restrictions or special instructions.   Follow-Up: At [facility], you and your health needs are our priority.  As part of our continuing mission to provide you with exceptional heart care, we have created designated Provider Care Teams.  These Care Teams include your primary Cardiologist (physician) and Advanced Practice Providers (APPs -  Physician Assistants and Nurse Practitioners) who all work together to provide you with the care you need, when you need it.     Your next appointment:   2 month(s)  The format for your next appointment:   Virtual   Provider:    Bryan Lemma, MD   Other Instructions  ZIO XT- Long Term Monitor Instructions  Your physician has requested you wear a ZIO patch monitor for 3 days.  This is a single patch monitor. [employer] supplies one patch monitor per enrollment. Additional stickers are not available. Please do not apply patch if you will be having a Nuclear Stress  Test,  Echocardiogram, Cardiac CT, MRI, or Chest Xray during the period you would be wearing the  monitor. The patch cannot be worn during these tests. You cannot remove and re-apply the  ZIO XT patch monitor.  Your ZIO patch monitor will be mailed 3 day USPS to your address on file. It may take 3-5 days  to receive your monitor after you have been enrolled.  Once you have received your monitor, please review the enclosed instructions. Your monitor  has already been registered assigning a specific monitor serial # to you.  Billing and Patient Assistance Program Information  We have supplied [employer] with any of your insurance information on file for billing purposes. [employer] offers a sliding scale Patient Assistance Program for patients that do not have  insurance, or whose insurance does not completely cover the cost of the ZIO monitor.  You must apply for the Patient Assistance Program to qualify for this discounted rate.  To apply, please call [employer] at [phone], select option 4, select option 2, ask to apply for  Patient Assistance Program. Meredeth Ide will ask your household income, and how many people  are in your household. They will quote your out-of-pocket cost based on that information.  [employer] will also be able to set up a 28-month, interest-free payment plan if needed.  Applying the monitor   Shave hair from upper left chest.  Hold abrader disc by orange tab. Rub abrader in 40 strokes over the upper left chest as  indicated in your monitor instructions.  Clean  area with 4 enclosed alcohol pads. Let dry.  Apply patch as indicated in monitor instructions. Patch will be placed under collarbone on left  side of chest with arrow pointing upward.  Rub patch adhesive wings for 2 minutes. Remove white label marked "1". Remove the white  label marked "2". Rub patch adhesive wings for 2 additional minutes.  While looking in a mirror, press and release button in center of patch. A  small green light will  flash 3-4 times. This will be your only indicator that the monitor has been turned on.  Do not shower for the first 24 hours. You may shower after the first 24 hours.  Press the button if you feel a symptom. You will hear a small click. Record Date, Time and  Symptom in the Patient Logbook.  When you are ready to remove the patch, follow instructions on the last 2 pages of Patient  Logbook. Stick patch monitor onto the last page of Patient Logbook.  Place Patient Logbook in the blue and white box. Use locking tab on box and tape box closed  securely. The blue and white box has prepaid postage on it. Please place it in the mailbox as  soon as possible. Your physician should have your test results approximately 7 days after the  monitor has been mailed back to [facility].  Call [facility] Customer Care at [phone] if you have questions regarding  your ZIO XT patch monitor. Call them immediately if you see an orange light blinking on your  monitor.  If your monitor falls off in less than 4 days, contact our Monitor department at [phone].  If your monitor becomes loose or falls off after 4 days call [employer] at [phone] for  suggestions on securing your monitor

## 2021-04-16 ENCOUNTER — Encounter: Payer: Self-pay | Admitting: Cardiology

## 2021-04-16 NOTE — Assessment & Plan Note (Signed)
Supposedly uses nasal mask.  Hopefully this will help her sleeping.

## 2021-04-16 NOTE — Assessment & Plan Note (Signed)
She is having these weird dizzy spells plus or minus conjunction with tachycardia spells.  Happening relatively frequently.  Plan: 3-day Zio patch monitor

## 2021-04-16 NOTE — Assessment & Plan Note (Signed)
I am sure that some of the dyspnea can be related to deconditioning with obesity.  Thankfully, she is not having real exertional chest pain.  Discussed importance of weight loss.

## 2021-04-16 NOTE — Assessment & Plan Note (Signed)
I suspect some of her shortness of breath may be associated with having some tachycardia and baseline COPD.

## 2021-04-16 NOTE — Assessment & Plan Note (Signed)
Discomfort and chest seems to be associated with fast heart rate spells and not without fast heart rate.  I suspect that she may be somewhat symptomatic with fast heart rates.  We will evaluate monitor, if any abnormal findings noted, with low threshold to consider we do a stress test.    Her last stress test was 8 years ago which was normal.  She is still very young, so unlikely.

## 2021-04-16 NOTE — Assessment & Plan Note (Signed)
I do think her episodes probably are related to panic attacks.  We will evaluate with a Zio patch monitor but also her dizziness we can evaluate with a carotid Doppler to exclude vertebral artery disease.  If these tests are negative, would probably recommend consideration of restarting Prozac or BuSpar.

## 2021-04-16 NOTE — Assessment & Plan Note (Signed)
Tough to tell what really causing the spells.  It may or may not be associated with the palpitations.  Does not seem positional.  She has not had true syncope.  Orthostatics were negative at PCPs office.  Thyroid level and chemistry level normal.  Plan: 3-day Zio patch monitor to assess for palpitations, carotid Dopplers to evaluate actually for vertebral artery stenosis or even potentially subclavian stenosis causing subclavian steal.  Not really describing true exertional chest pain, so we will hold off on stress test for now.

## 2021-04-18 ENCOUNTER — Encounter (HOSPITAL_COMMUNITY): Payer: No Typology Code available for payment source

## 2021-04-20 ENCOUNTER — Other Ambulatory Visit: Payer: Self-pay

## 2021-04-20 ENCOUNTER — Ambulatory Visit (HOSPITAL_COMMUNITY)
Admission: RE | Admit: 2021-04-20 | Discharge: 2021-04-20 | Disposition: A | Discharge status: Home / Self Care | Payer: No Typology Code available for payment source | Source: Ambulatory Visit | Attending: Cardiology | Admitting: Cardiology

## 2021-04-20 DIAGNOSIS — R42 Dizziness and giddiness: Secondary | ICD-10-CM

## 2021-04-20 DIAGNOSIS — R000 Tachycardia, unspecified: Secondary | ICD-10-CM

## 2021-04-22 DIAGNOSIS — R002 Palpitations: Secondary | ICD-10-CM | POA: Diagnosis not present

## 2021-04-22 DIAGNOSIS — R42 Dizziness and giddiness: Secondary | ICD-10-CM | POA: Diagnosis not present

## 2021-04-22 DIAGNOSIS — R000 Tachycardia, unspecified: Secondary | ICD-10-CM | POA: Diagnosis not present

## 2021-05-01 ENCOUNTER — Encounter (HOSPITAL_COMMUNITY): Payer: No Typology Code available for payment source

## 2021-05-14 ENCOUNTER — Telehealth: Payer: Self-pay

## 2021-05-14 NOTE — Telephone Encounter (Signed)
Patient is wanting to do a TOC from [place] to [place]. Okay for TOC?

## 2021-06-29 ENCOUNTER — Other Ambulatory Visit (HOSPITAL_COMMUNITY): Payer: Self-pay

## 2021-06-29 MED ORDER — PRENATAL DHA 200 MG PO CAPS
200.0000 mg | ORAL_CAPSULE | Freq: Every day | ORAL | 4 refills | Status: DC
  Filled 2021-06-29: qty 90, 90d supply, fill #0
  Filled 2022-01-09: qty 90, 90d supply, fill #1

## 2021-07-02 ENCOUNTER — Other Ambulatory Visit (HOSPITAL_COMMUNITY): Payer: Self-pay

## 2021-07-04 ENCOUNTER — Ambulatory Visit: Payer: No Typology Code available for payment source | Admitting: Internal Medicine

## 2021-07-27 ENCOUNTER — Telehealth: Payer: No Typology Code available for payment source | Admitting: Cardiology

## 2021-08-14 ENCOUNTER — Other Ambulatory Visit: Payer: Self-pay | Admitting: Obstetrics and Gynecology

## 2021-08-14 DIAGNOSIS — R928 Other abnormal and inconclusive findings on diagnostic imaging of breast: Secondary | ICD-10-CM

## 2021-08-20 ENCOUNTER — Encounter: Payer: Self-pay | Admitting: Physician Assistant

## 2021-08-20 ENCOUNTER — Ambulatory Visit (INDEPENDENT_AMBULATORY_CARE_PROVIDER_SITE_OTHER): Payer: No Typology Code available for payment source | Admitting: Physician Assistant

## 2021-08-20 ENCOUNTER — Other Ambulatory Visit (HOSPITAL_COMMUNITY): Payer: Self-pay

## 2021-08-20 ENCOUNTER — Other Ambulatory Visit: Payer: Self-pay

## 2021-08-20 VITALS — BP 110/68 | HR 110 | Temp 97.3°F | Ht 61.0 in | Wt 184.4 lb

## 2021-08-20 DIAGNOSIS — F411 Generalized anxiety disorder: Secondary | ICD-10-CM

## 2021-08-20 DIAGNOSIS — Z23 Encounter for immunization: Secondary | ICD-10-CM | POA: Diagnosis not present

## 2021-08-20 DIAGNOSIS — R21 Rash and other nonspecific skin eruption: Secondary | ICD-10-CM

## 2021-08-20 DIAGNOSIS — J449 Chronic obstructive pulmonary disease, unspecified: Secondary | ICD-10-CM | POA: Diagnosis not present

## 2021-08-20 DIAGNOSIS — E1169 Type 2 diabetes mellitus with other specified complication: Secondary | ICD-10-CM

## 2021-08-20 DIAGNOSIS — Z720 Tobacco use: Secondary | ICD-10-CM

## 2021-08-20 DIAGNOSIS — F320 Major depressive disorder, single episode, mild: Secondary | ICD-10-CM

## 2021-08-20 MED ORDER — KETOCONAZOLE 2 % EX CREA
TOPICAL_CREAM | CUTANEOUS | 0 refills | Status: DC
  Filled 2021-08-20: qty 15, 15d supply, fill #0

## 2021-08-20 MED ORDER — TRIAMCINOLONE ACETONIDE 0.1 % EX CREA
TOPICAL_CREAM | CUTANEOUS | 0 refills | Status: DC
  Filled 2021-08-20: qty 30, 30d supply, fill #0

## 2021-08-20 NOTE — Patient Instructions (Signed)
It was great to see you!  Apply ketoconazole (fungal ointment) to area 1-2 times daily If no improvement, stop this and use the other ointment triamcinolone (steroid ointment) to area 1-2 times daily  Let's follow-up in 3 months to address your diabetes, sooner if you have concerns. After done trying for baby, please come see me to discuss smoking cessation and cholesterol management.  Take care,  Jarold Motto PA-C

## 2021-08-20 NOTE — Progress Notes (Signed)
Andrea [name] is a 40 y.o. female here for a follow up referred by former PCP, Dr. Ardyth Harps.   History of Present Illness:   Chief Complaint  Patient presents with   Transfer of care   Diabetes   Foot Problem    Red spot on top of right foot, x 1.5 weeks    HPI  Fungal Rash  Andrea [name] presents with c/o a erythematous spot located on the top of her right foot. She states it has been present for a week and a half and has grown in size slightly. Initially she believed it to be fungal but isn't sure where she could pick something like that up from. Denies itching sensation, pain or drainage.   Diabetes States she is no longer taking the metformin BID due to stomach upset. Currently compliant with taking metformin 500 mg daily with no adverse effects. She reports she is trying to eat well but still struggles. Denies: hypoglycemic or hyperglycemic episodes or symptoms.   Lab Results  Component Value Date   HGBA1C 6.0 (H) 03/08/2021   COPD/Tobacco abuse Andrea [name] was dx with COPD around 2019 which provided her with a chronic cough. At that time she was given symbicort to use prn which provided major relief. Although she is still currently  smoking about a pack a day, she is no longer in need of the symbicort inhaler. She has tried to stop in the past permanently but has been unsuccessful. At this time she is not interested in stopping.   Anxiety/Depression Andrea [name] initially was taking lexapro to manage her mood, but soon had to stop due to decreased libido. Shortly after that she trialed prozac but later stop use of that due to her pregnancy. 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 Although she still experiences anxiety, she is managing well.    Past Medical History:  Diagnosis Date   Abdominal pain    Anxiety    Carpal tunnel syndrome on both sides    Diabetes mellitus without  complication (HCC)    type 2    Headache    High cholesterol    Hyperlipidemia    Status post cesarean delivery 12/28/2019     Social History   Tobacco Use   Smoking status: Every Day    Packs/day: 1.00    Years: 19.00    Pack years: 19.00    Types: Cigarettes   Smokeless tobacco: Never  Vaping Use   Vaping Use: Never used  Substance Use Topics   Alcohol use: Yes    Alcohol/week: 1.0 standard drink    Types: 1 Glasses of wine per week    Comment: very rare   Drug use: No    Past Surgical History:  Procedure Laterality Date   CESAREAN SECTION N/A 12/28/2019   Procedure: CESAREAN SECTION;  Surgeon: Edwinna Areola, DO;  Location: [facility] LD ORS;  Service: Obstetrics;  Laterality: N/AHerbert [name], RNFA   CHOLECYSTECTOMY  08/05/2011   KIDNEY STONE SURGERY     NM MYOVIEW LTD  05/07/2013   NORMAL STUDY.  Good exercise capacity with normal BP response.  No EKG changes consistent with ischemia.  Normal LV function (EF 60%) with no R WMA.  No Ischemia or Infarction --> LOW RISK    Family History  Problem Relation Age of Onset   Heart attack Mother 17   Early death  Mother 73   Depression Mother    Coronary artery disease Mother 51   Hyperlipidemia Mother    Heart failure Father 54   Depression Father    Healthy Brother    Heart disease Maternal Grandmother    Miscarriages / Stillbirths Paternal Grandmother    Heart attack Paternal Grandfather 7   Early death Paternal Grandfather 58       Died of complications CAD in his 35s   Coronary artery disease Paternal Grandfather 65   Hyperlipidemia Paternal Grandfather    Coronary artery disease Maternal Uncle 42   Heart attack Maternal Uncle 54   Early death Maternal Uncle        Died in his 46s from complications of CAD   Heart attack Paternal Aunt        During a stress test   Heart attack Paternal Uncle    Early death Paternal Uncle        Died in his 45s    Allergies  Allergen Reactions   Wellbutrin [Bupropion]  Other (See Comments)    Suicidality    Current Medications:   Current Outpatient Medications:    acetaminophen (TYLENOL) 500 MG tablet, Take 1,000 mg by mouth every 6 (six) hours as needed for mild pain or headache., Disp: , Rfl:    Docosahexaenoic Acid (PRENATAL DHA) 200 MG CAPS, Take 1 capsule (200 mg total) by mouth daily., Disp: 180 capsule, Rfl: 4   folic acid (FOLVITE) 1 MG tablet, Take 1 mg by mouth daily. Pt is taking 1000 mcg daily, Disp: , Rfl:    ibuprofen (ADVIL) 800 MG tablet, Take 1 tablet (800 mg total) by mouth every 8 (eight) hours as needed., Disp: 100 tablet, Rfl: 3   ketoconazole (NIZORAL) 2 % cream, Apply to affected area 1-2 times daily, Disp: 15 g, Rfl: 0   triamcinolone cream (KENALOG) 0.1 %, Apply to affected area 1-2 times daily, Disp: 30 g, Rfl: 0   metFORMIN (GLUCOPHAGE-XR) 500 MG 24 hr tablet, TAKE 1 TABLET BY MOUTH 2 TIMES DAILY., Disp: 180 tablet, Rfl: 2   Review of Systems:   ROS Negative unless otherwise specified per HPI.  Vitals:   Vitals:   08/20/21 1443  BP: 110/68  Pulse: (!) 110  Temp: (!) 97.3 F (36.3 C)  TempSrc: Temporal  SpO2: 97%  Weight: 184 lb 6.1 oz (83.6 kg)  Height: 5\' 1"  (1.549 m)     Body mass index is 34.84 kg/m.  Physical Exam:   Physical Exam Vitals and nursing note reviewed.  Constitutional:      General: She is not in acute distress.    Appearance: She is well-developed. She is not ill-appearing or toxic-appearing.  Cardiovascular:     Rate and Rhythm: Normal rate and regular rhythm.     Pulses: Normal pulses.     Heart sounds: Normal heart sounds, S1 normal and S2 normal.  Pulmonary:     Effort: Pulmonary effort is normal.     Breath sounds: Normal breath sounds.  Feet:     Right foot:     Skin integrity: Erythema present.     Toenail Condition: Right toenails are normal.     Left foot:     Skin integrity: Skin integrity normal.     Toenail Condition: Left toenails are normal.  Skin:    General: Skin  is warm and dry.     Comments: Right foot with 1 cm erythematous lesion with raised border to dorsum of  R foot  Neurological:     Mental Status: She is alert.     GCS: GCS eye subscore is 4. GCS verbal subscore is 5. GCS motor subscore is 6.  Psychiatric:        Speech: Speech normal.        Behavior: Behavior normal. Behavior is cooperative.    Assessment and Plan:   Type 2 diabetes mellitus with other specified complication, without long-term current use of insulin (HCC) Above goal per patient Continue to work on diet and exercise as able Continue metformin 500 mg XR daily Follow-up in 3 months  Tobacco abuse Counseled briefly, she is not ready to quit at this time Continue efforts at counseling  Chronic obstructive pulmonary disease, unspecified COPD type (HCC) Well controlled Declines need for inhalers at this time Continue to monitor  GAD (generalized anxiety disorder); Depression, major, single episode, mild (HCC) Well controlled overall Consider resumption of prozac after pregnancy if desired (she does not want to take this during pregnancy)  Rash Suspect possible fungal rash Advised as follows: Apply ketoconazole (fungal ointment) to area 1-2 times daily If no improvement, stop this and use the other ointment triamcinolone (steroid ointment) to area 1-2 times daily   I,Havlyn C Ratchford,acting as a scribe for [employer], PA.,have documented all relevant documentation on the behalf of Jarold Motto, PA,as directed by  Jarold Motto, PA while in the presence of Jarold Motto, [place].  I, Jarold Motto, [place], have reviewed all documentation for this visit. The documentation on 08/20/21 for the exam, diagnosis, procedures, and orders are all accurate and complete.   Jarold Motto, PA-C

## 2021-08-21 ENCOUNTER — Other Ambulatory Visit (HOSPITAL_COMMUNITY): Payer: Self-pay

## 2021-08-22 ENCOUNTER — Other Ambulatory Visit (HOSPITAL_COMMUNITY): Payer: Self-pay

## 2021-08-25 ENCOUNTER — Other Ambulatory Visit: Payer: Self-pay | Admitting: Obstetrics and Gynecology

## 2021-08-25 ENCOUNTER — Other Ambulatory Visit: Payer: Self-pay

## 2021-08-25 ENCOUNTER — Ambulatory Visit
Admission: RE | Admit: 2021-08-25 | Discharge: 2021-08-25 | Disposition: A | Discharge status: Home / Self Care | Payer: No Typology Code available for payment source | Source: Ambulatory Visit | Attending: Obstetrics and Gynecology | Admitting: Obstetrics and Gynecology

## 2021-08-25 DIAGNOSIS — N6320 Unspecified lump in the left breast, unspecified quadrant: Secondary | ICD-10-CM

## 2021-08-25 DIAGNOSIS — R928 Other abnormal and inconclusive findings on diagnostic imaging of breast: Secondary | ICD-10-CM

## 2021-09-03 ENCOUNTER — Other Ambulatory Visit (HOSPITAL_COMMUNITY): Payer: Self-pay | Admitting: Diagnostic Radiology

## 2021-09-03 ENCOUNTER — Ambulatory Visit
Admission: RE | Admit: 2021-09-03 | Discharge: 2021-09-03 | Disposition: A | Discharge status: Home / Self Care | Payer: No Typology Code available for payment source | Source: Ambulatory Visit | Attending: Obstetrics and Gynecology | Admitting: Obstetrics and Gynecology

## 2021-09-03 DIAGNOSIS — N6320 Unspecified lump in the left breast, unspecified quadrant: Secondary | ICD-10-CM

## 2021-09-05 ENCOUNTER — Other Ambulatory Visit: Payer: No Typology Code available for payment source

## 2021-09-13 ENCOUNTER — Other Ambulatory Visit: Payer: No Typology Code available for payment source

## 2021-09-25 ENCOUNTER — Other Ambulatory Visit (HOSPITAL_COMMUNITY): Payer: Self-pay

## 2021-09-25 ENCOUNTER — Encounter: Payer: Self-pay | Admitting: Physician Assistant

## 2021-09-25 ENCOUNTER — Telehealth (INDEPENDENT_AMBULATORY_CARE_PROVIDER_SITE_OTHER): Payer: No Typology Code available for payment source | Admitting: Physician Assistant

## 2021-09-25 DIAGNOSIS — R051 Acute cough: Secondary | ICD-10-CM

## 2021-09-25 MED ORDER — PREDNISONE 20 MG PO TABS
40.0000 mg | ORAL_TABLET | Freq: Every day | ORAL | 0 refills | Status: DC
  Filled 2021-09-25: qty 10, 5d supply, fill #0

## 2021-09-25 MED ORDER — AZITHROMYCIN 250 MG PO TABS
ORAL_TABLET | ORAL | 0 refills | Status: AC
  Filled 2021-09-25: qty 6, 5d supply, fill #0

## 2021-09-25 NOTE — Progress Notes (Signed)
Virtual Visit via Video Note   I, Jarold Motto, connected with  Andrea [name]  ([id number], August 26, 1981) on 09/25/21 at  3:00 PM EST by a video-enabled telemedicine application and verified that I am speaking with the correct person using two identifiers.  Location: Patient: Virtual Visit Location Patient: Home Provider: Virtual Visit Location Provider: Office/Clinic   I discussed the limitations of evaluation and management by telemedicine and the availability of in person appointments. The patient expressed understanding and agreed to proceed.    History of Present Illness: Andrea [name] is a 40 y.o. who identifies as a female who was assigned female at birth, and is being seen today for URI symptoms.  Daughters were both recently sick. They had flu-like symptoms but were not tested. Patient complains of fever, chills, congestion, body aches, extreme fatigue, productive cough since Thursday and keeps getting worse. Pt has not been tested for Covid, however her daughters were negative.  Has taken ibuprofen and tylenol.   Ongoing smoker. Hx of COPD but no regular or as needed inhalers.   Denies: chest pain, SOB, LE swelling, inability to stay well hydrated or keep fluids down   Problems:  Patient Active Problem List   Diagnosis Date Noted   Depression, major, single episode, mild (HCC) 03/23/2018   COPD (chronic obstructive pulmonary disease) (HCC) 06/18/2017   Carpal tunnel syndrome 06/18/2017   Eczema, dyshidrotic 07/29/2016   GAD (generalized anxiety disorder) 02/22/2016   Diabetes mellitus type 2, uncomplicated (HCC) 02/22/2016   Obstructive sleep apnea 05/02/2013   Obesity 05/02/2013   Tobacco abuse 05/02/2013    Allergies:  Allergies  Allergen Reactions   Wellbutrin [Bupropion] Other (See Comments)    Suicidality   Medications:  Current Outpatient Medications:    acetaminophen (TYLENOL) 500 MG tablet, Take 1,000 mg by mouth every 6 (six) hours as  needed for mild pain or headache., Disp: , Rfl:    azithromycin (ZITHROMAX) 250 MG tablet, Take 2 tablets by mouth on day 1, then 1 tablet daily on days 2 through 5, Disp: 6 tablet, Rfl: 0   Docosahexaenoic Acid (PRENATAL DHA) 200 MG CAPS, Take 1 capsule (200 mg total) by mouth daily., Disp: 180 capsule, Rfl: 4   folic acid (FOLVITE) 1 MG tablet, Take 1 mg by mouth daily. Pt is taking 1000 mcg daily, Disp: , Rfl:    ibuprofen (ADVIL) 800 MG tablet, Take 1 tablet (800 mg total) by mouth every 8 (eight) hours as needed., Disp: 100 tablet, Rfl: 3   predniSONE (DELTASONE) 20 MG tablet, Take 2 tablets (40 mg total) by mouth daily., Disp: 10 tablet, Rfl: 0   ketoconazole (NIZORAL) 2 % cream, Apply to affected area 1-2 times daily (Patient not taking: Reported on 09/25/2021), Disp: 15 g, Rfl: 0   metFORMIN (GLUCOPHAGE-XR) 500 MG 24 hr tablet, TAKE 1 TABLET BY MOUTH 2 TIMES DAILY., Disp: 180 tablet, Rfl: 2   triamcinolone cream (KENALOG) 0.1 %, Apply to affected area 1-2 times daily (Patient not taking: Reported on 09/25/2021), Disp: 30 g, Rfl: 0  Observations/Objective: Patient is well-developed, well-nourished in no acute distress.  Resting comfortably  at home.  Head is normocephalic, atraumatic.  No labored breathing.  Speech is clear and coherent with logical content.  Patient is alert and oriented at baseline.    Assessment and Plan: 1. Acute cough No red flags on discussion.  Will initiate oral azithromycin and prednisone 20 mg BID x 5 days per orders. Discussed taking medications as prescribed.  Reviewed return precautions including worsening fever, SOB, worsening cough or other concerns. Push fluids and rest. I recommend that patient follow-up if symptoms worsen or persist despite treatment x 7-10 days, sooner if needed.   Follow Up Instructions: I discussed the assessment and treatment plan with the patient. The patient was provided an opportunity to ask questions and all were answered. The  patient agreed with the plan and demonstrated an understanding of the instructions.  A copy of instructions were sent to the patient via MyChart unless otherwise noted below.   The patient was advised to call back or seek an in-person evaluation if the symptoms worsen or if the condition fails to improve as anticipated.  Jarold Motto, [place]

## 2021-09-28 ENCOUNTER — Other Ambulatory Visit (HOSPITAL_COMMUNITY): Payer: Self-pay

## 2021-09-28 ENCOUNTER — Encounter: Payer: Self-pay | Admitting: Physician Assistant

## 2021-09-28 MED ORDER — FLUCONAZOLE 150 MG PO TABS
150.0000 mg | ORAL_TABLET | Freq: Once | ORAL | 0 refills | Status: AC
  Filled 2021-09-28 (×2): qty 1, 1d supply, fill #0

## 2021-09-28 MED ORDER — FLUCONAZOLE 150 MG PO TABS: 150.0000 mg | ORAL_TABLET | Freq: Once | ORAL | 0 refills | Status: DC

## 2021-10-09 ENCOUNTER — Other Ambulatory Visit (HOSPITAL_COMMUNITY): Payer: Self-pay

## 2021-10-12 ENCOUNTER — Encounter: Payer: Self-pay | Admitting: Physician Assistant

## 2021-12-21 ENCOUNTER — Encounter: Payer: Self-pay | Admitting: Physician Assistant

## 2021-12-26 ENCOUNTER — Telehealth: Payer: Self-pay | Admitting: Physician Assistant

## 2021-12-26 NOTE — Telephone Encounter (Signed)
See note

## 2021-12-26 NOTE — Telephone Encounter (Signed)
Pt would like to do 320 as a vv apt instead of coming in office- Pt aware [facility] approval is needed first , pt agrees. Awaiting response.  ?Reason for apt: Medication review per patient  ?

## 2021-12-31 ENCOUNTER — Other Ambulatory Visit (HOSPITAL_COMMUNITY): Payer: Self-pay

## 2021-12-31 ENCOUNTER — Encounter: Payer: Self-pay | Admitting: Physician Assistant

## 2021-12-31 ENCOUNTER — Telehealth (INDEPENDENT_AMBULATORY_CARE_PROVIDER_SITE_OTHER): Payer: No Typology Code available for payment source | Admitting: Physician Assistant

## 2021-12-31 VITALS — Ht 61.0 in

## 2021-12-31 DIAGNOSIS — E1169 Type 2 diabetes mellitus with other specified complication: Secondary | ICD-10-CM | POA: Diagnosis not present

## 2021-12-31 DIAGNOSIS — E785 Hyperlipidemia, unspecified: Secondary | ICD-10-CM

## 2021-12-31 DIAGNOSIS — F411 Generalized anxiety disorder: Secondary | ICD-10-CM | POA: Diagnosis not present

## 2021-12-31 DIAGNOSIS — F320 Major depressive disorder, single episode, mild: Secondary | ICD-10-CM

## 2021-12-31 MED ORDER — FLUOXETINE HCL 20 MG PO CAPS
20.0000 mg | ORAL_CAPSULE | Freq: Every day | ORAL | 1 refills | Status: DC
  Filled 2021-12-31: qty 90, 90d supply, fill #0
  Filled 2022-09-04: qty 90, 90d supply, fill #1

## 2021-12-31 MED ORDER — ATORVASTATIN CALCIUM 20 MG PO TABS
20.0000 mg | ORAL_TABLET | Freq: Every day | ORAL | 3 refills | Status: DC
  Filled 2021-12-31: qty 90, 90d supply, fill #0

## 2021-12-31 NOTE — Progress Notes (Signed)
? ?Virtual Visit via Video Note  ? ?Andrea [name], connected with  Andrea [name]  ([id number], Aug 22, 1981) on 12/31/21 at 11:00 AM EDT by a video-enabled telemedicine application and verified that I am speaking with the correct person using two identifiers. ? ?Location: ?Patient: Home ?Provider: [facility] [place] office ?  ?I discussed the limitations of evaluation and management by telemedicine and the availability of in person appointments. The patient expressed understanding and agreed to proceed.   ? ?History of Present Illness: ?Andrea [name] is a 41 y.o. who identifies as a female who was assigned female at birth, and is being seen today for anxiety/depression and HLD. ? ?Anxiety/Depression ?Uncontrolled. She was on prozac prior to pregnancy and it worked well for her. She is not going to have anymore children. She would like to restart her prozac. Denies SI/HI. ? ?HLD ?Was told at last lab draw that she needs a statin. She was uncertain of this due to wanting to conceive. Now that she has decided to no longer pursue pregnancy, she would like to take this. She is a diabetic and a smoker. ? ? ?Problems:  ?Patient Active Problem List  ? Diagnosis Date Noted  ? Depression, major, single episode, mild (HCC) 03/23/2018  ? COPD (chronic obstructive pulmonary disease) (HCC) 06/18/2017  ? Carpal tunnel syndrome 06/18/2017  ? Eczema, dyshidrotic 07/29/2016  ? GAD (generalized anxiety disorder) 02/22/2016  ? Diabetes mellitus type 2, uncomplicated (HCC) 02/22/2016  ? Obstructive sleep apnea 05/02/2013  ? Obesity 05/02/2013  ? Tobacco abuse 05/02/2013  ?  ?Allergies:  ?Allergies  ?Allergen Reactions  ? Wellbutrin [Bupropion] Other (See Comments)  ?  Suicidality  ? ?Medications:  ?Current Outpatient Medications:  ?  acetaminophen (TYLENOL) 500 MG tablet, Take 1,000 mg by mouth every 6 (six) hours as needed for mild pain or headache., Disp: , Rfl:  ?  atorvastatin (LIPITOR) 20 MG tablet, Take 1  tablet (20 mg total) by mouth daily., Disp: 90 tablet, Rfl: 3 ?  Docosahexaenoic Acid (PRENATAL DHA) 200 MG CAPS, Take 1 capsule (200 mg total) by mouth daily., Disp: 180 capsule, Rfl: 4 ?  FLUoxetine (PROZAC) 20 MG capsule, Take 1 capsule (20 mg total) by mouth daily., Disp: 90 capsule, Rfl: 1 ?  folic acid (FOLVITE) 1 MG tablet, Take 1 mg by mouth daily. Pt is taking 1000 mcg daily, Disp: , Rfl:  ?  ibuprofen (ADVIL) 800 MG tablet, Take 1 tablet (800 mg total) by mouth every 8 (eight) hours as needed., Disp: 100 tablet, Rfl: 3 ?  metFORMIN (GLUCOPHAGE-XR) 500 MG 24 hr tablet, TAKE 1 TABLET BY MOUTH 2 TIMES DAILY., Disp: 180 tablet, Rfl: 2 ? ?Observations/Objective: ?Patient is well-developed, well-nourished in no acute distress.  ?Resting comfortably  at home.  ?Head is normocephalic, atraumatic.  ?No labored breathing.  ?Speech is clear and coherent with logical content.  ?Patient is alert and oriented at baseline.  ? ?Assessment and Plan: ?Depression, major, single episode, mild (HCC); GAD (generalized anxiety disorder) ?Uncontrolled ?Denies SI/HI ?Start prozac 20 mg daily ?Follow-up in 3-6 months, sooner if concerns ? ?Hyperlipidemia associated with type 2 diabetes mellitus (HCC) ?Uncontrolled ?ASCVD is 8.8%; counseled ?Start lipitor 20 mg daily ?Follow-up  ? ?Follow Up Instructions: ?I discussed the assessment and treatment plan with the patient. The patient was provided an opportunity to ask questions and all were answered. The patient agreed with the plan and demonstrated an understanding of the instructions.  A copy of instructions were sent  to the patient via MyChart unless otherwise noted below.  ? ?The patient was advised to call back or seek an in-person evaluation if the symptoms worsen or if the condition fails to improve as anticipated. ? ?Jarold Motto, PA ?

## 2022-01-04 ENCOUNTER — Other Ambulatory Visit (HOSPITAL_COMMUNITY): Payer: Self-pay

## 2022-01-04 ENCOUNTER — Encounter: Payer: Self-pay | Admitting: Family

## 2022-01-04 ENCOUNTER — Ambulatory Visit: Payer: No Typology Code available for payment source | Admitting: Physician Assistant

## 2022-01-04 ENCOUNTER — Ambulatory Visit (INDEPENDENT_AMBULATORY_CARE_PROVIDER_SITE_OTHER): Payer: No Typology Code available for payment source | Admitting: Family

## 2022-01-04 VITALS — BP 110/78 | HR 99 | Temp 99.1°F | Ht 61.0 in | Wt 185.0 lb

## 2022-01-04 DIAGNOSIS — J069 Acute upper respiratory infection, unspecified: Secondary | ICD-10-CM | POA: Diagnosis not present

## 2022-01-04 DIAGNOSIS — R52 Pain, unspecified: Secondary | ICD-10-CM | POA: Diagnosis not present

## 2022-01-04 LAB — POCT INFLUENZA A/B
Influenza A, POC: NEGATIVE
Influenza B, POC: NEGATIVE

## 2022-01-04 MED ORDER — AZITHROMYCIN 250 MG PO TABS
ORAL_TABLET | ORAL | 0 refills | Status: DC
  Filled 2022-01-04: qty 6, 5d supply, fill #0

## 2022-01-04 MED ORDER — FLUCONAZOLE 150 MG PO TABS
ORAL_TABLET | ORAL | 0 refills | Status: DC
  Filled 2022-01-04: qty 2, 3d supply, fill #0

## 2022-01-04 NOTE — Progress Notes (Signed)
?Andrea [name] is a 41 y.o. female with the following history as recorded in [facility]:  ?Patient Active Problem List  ? Diagnosis Date Noted  ? Depression, major, single episode, mild ([place]) 03/23/2018  ? COPD (chronic obstructive pulmonary disease) ([place]) 06/18/2017  ? Carpal tunnel syndrome 06/18/2017  ? Eczema, dyshidrotic 07/29/2016  ? GAD (generalized anxiety disorder) 02/22/2016  ? Diabetes mellitus type 2, uncomplicated ([place]) [id number]  ? Obstructive sleep apnea 05/02/2013  ? Obesity 05/02/2013  ? Tobacco abuse 05/02/2013  ?  ?Current Outpatient Medications  ?Medication Sig Dispense Refill  ? azithromycin (ZITHROMAX) 250 MG tablet Take 2 tablets by mouth for 1 day then take 1 tablet by mouth daily for 4 days 6 tablet 0  ? fluconazole (DIFLUCAN) 150 MG tablet Take at onset of symptoms; repeat after 72 hours 2 tablet 0  ? acetaminophen (TYLENOL) 500 MG tablet Take 1,000 mg by mouth every 6 (six) hours as needed for mild pain or headache.    ? atorvastatin (LIPITOR) 20 MG tablet Take 1 tablet (20 mg total) by mouth daily. 90 tablet 3  ? Docosahexaenoic Acid (PRENATAL DHA) 200 MG CAPS Take 1 capsule (200 mg total) by mouth daily. 180 capsule 4  ? FLUoxetine (PROZAC) 20 MG capsule Take 1 capsule (20 mg total) by mouth daily. 90 capsule 1  ? folic acid (FOLVITE) 1 MG tablet Take 1 mg by mouth daily. Pt is taking 1000 mcg daily    ? ibuprofen (ADVIL) 800 MG tablet Take 1 tablet (800 mg total) by mouth every 8 (eight) hours as needed. 100 tablet 3  ? metFORMIN (GLUCOPHAGE-XR) 500 MG 24 hr tablet TAKE 1 TABLET BY MOUTH 2 TIMES DAILY. 180 tablet 2  ? ?No current facility-administered medications for this visit.  ?  ?Allergies: Wellbutrin [bupropion]  ?Past Medical History:  ?Diagnosis Date  ? Abdominal pain   ? Anxiety   ? Carpal tunnel syndrome on both sides   ? Diabetes mellitus without complication ([place])   ? type 2   ? Headache   ? High cholesterol   ? Hyperlipidemia   ? Status post cesarean delivery  12/28/2019  ?  ?Past Surgical History:  ?Procedure Laterality Date  ? CESAREAN SECTION N/A 12/28/2019  ? Procedure: CESAREAN SECTION;  Surgeon: Sherlyn Hay, DO;  Location: [facility];  Service: Obstetrics;  Laterality: N/ANira Conn, RNFA  ? CHOLECYSTECTOMY  08/05/2011  ? KIDNEY STONE SURGERY    ? NM MYOVIEW LTD  05/07/2013  ? NORMAL STUDY.  Good exercise capacity with normal BP response.  No EKG changes consistent with ischemia.  Normal LV function (EF 60%) with no R WMA.  No Ischemia or Infarction --> LOW RISK  ?  ?Family History  ?Problem Relation Age of Onset  ? Heart attack Mother 82  ? Early death Mother 4  ? Depression Mother   ? Coronary artery disease Mother 9  ? Hyperlipidemia Mother   ? Heart failure Father 72  ? Depression Father   ? Healthy Brother   ? Heart disease Maternal Grandmother   ? Miscarriages / Stillbirths Paternal Grandmother   ? Heart attack Paternal Grandfather 20  ? Early death Paternal Grandfather 6  ?     Died of complications CAD in his 89s  ? Coronary artery disease Paternal Grandfather 49  ? Hyperlipidemia Paternal Grandfather   ? Coronary artery disease Maternal Uncle 42  ? Heart attack Maternal Uncle 42  ? Early death Maternal Uncle   ?  Died in his [id number] from complications of CAD  ? Heart attack Paternal Aunt   ?     During a stress test  ? Heart attack Paternal Uncle   ? Early death Paternal Uncle   ?     Died in his 51s  ?  ?Social History  ? ?Tobacco Use  ? Smoking status: Every Day  ?  Packs/day: 1.00  ?  Years: 19.00  ?  Pack years: 19.00  ?  Types: Cigarettes  ? Smokeless tobacco: Never  ?Substance Use Topics  ? Alcohol use: Yes  ?  Alcohol/week: 1.0 standard drink  ?  Types: 1 Glasses of wine per week  ?  Comment: very rare  ?  ?Subjective:  ? ?Started Tuesday with cough/ congestion/ body aches/ low grade fever; notes that both daughters are sick with similar symptoms; had birthday party at home prior to onset of symptoms; ?No chest pain or shortness of breath;   ?3 negative at home COVID tests;  ? ? ? ? ?Objective:  ?Vitals:  ? 01/04/22 1354  ?BP: 110/78  ?Pulse: 99  ?Temp: 99.1 ?F (37.3 ?C)  ?TempSrc: Oral  ?SpO2: 98%  ?Weight: 185 lb (83.9 kg)  ?Height: 5\' 1"  (1.549 m)  ?  ?General: Well developed, well nourished, in no acute distress  ?Skin : Warm and dry.  ?Head: Normocephalic and atraumatic  ?Eyes: Sclera and conjunctiva clear; pupils round and reactive to light; extraocular movements intact  ?Ears: External normal; canals clear; tympanic membranes normal  ?Oropharynx: Pink, supple. No suspicious lesions  ?Neck: Supple without thyromegaly, adenopathy  ?Lungs: Respirations unlabored; clear to auscultation bilaterally without wheeze, rales, rhonchi  ?CVS exam: normal rate and regular rhythm.  ?Neurologic: Alert and oriented; speech intact; face symmetrical; moves all extremities well; CNII-XII intact without focal deficit  ? ?Assessment:  ?1. Viral URI with cough   ?2. Body aches   ?  ?Plan:  ?Reassurance; negative flu test in office; symptomatic treatment discussed; increase fluids, rest; due to upcoming weekend, will send in [place] but patient understands to hold and only fill if symptoms worsen over the next 48 hours; follow up worse, no better.  ? ?This visit occurred during the SARS-CoV-2 public health emergency.  Safety protocols were in place, including screening questions prior to the visit, additional usage of staff PPE, and extensive cleaning of exam room while observing appropriate contact time as indicated for disinfecting solutions.  ? ? ?No follow-ups on file.  ?Orders Placed This Encounter  ?Procedures  ? POCT Influenza A/B  ?  ?Requested Prescriptions  ? ?Signed Prescriptions Disp Refills  ? azithromycin (ZITHROMAX) 250 MG tablet 6 tablet 0  ?  Sig: Take 2 tablets by mouth for 1 day then take 1 tablet by mouth daily for 4 days  ? fluconazole (DIFLUCAN) 150 MG tablet 2 tablet 0  ?  Sig: Take at onset of symptoms; repeat after 72 hours  ?  ? ?

## 2022-01-05 ENCOUNTER — Other Ambulatory Visit (HOSPITAL_COMMUNITY): Payer: Self-pay

## 2022-01-05 ENCOUNTER — Other Ambulatory Visit: Payer: Self-pay | Admitting: Physician Assistant

## 2022-01-07 ENCOUNTER — Other Ambulatory Visit (HOSPITAL_COMMUNITY): Payer: Self-pay

## 2022-01-07 MED ORDER — METFORMIN HCL ER 500 MG PO TB24
ORAL_TABLET | Freq: Two times a day (BID) | ORAL | 2 refills | Status: DC
  Filled 2022-01-07: qty 180, 90d supply, fill #0

## 2022-01-07 NOTE — Telephone Encounter (Signed)
Last refills 1 year ago by Salley Scarlet, MD ?

## 2022-01-09 ENCOUNTER — Other Ambulatory Visit (HOSPITAL_COMMUNITY): Payer: Self-pay

## 2022-01-10 ENCOUNTER — Other Ambulatory Visit (HOSPITAL_COMMUNITY): Payer: Self-pay

## 2022-01-11 ENCOUNTER — Other Ambulatory Visit (HOSPITAL_COMMUNITY): Payer: Self-pay

## 2022-03-02 IMAGING — MG MM BREAST LOCALIZATION CLIP
4 series · 4 of 12 positions shown · non-contrast
Comparison: Previous exam(s).

CLINICAL DATA: Status post ultrasound-guided core biopsy of the
left breast.

EXAM:
3D DIAGNOSTIC LEFT MAMMOGRAM POST ULTRASOUND BIOPSY

[L CC synth-2D]
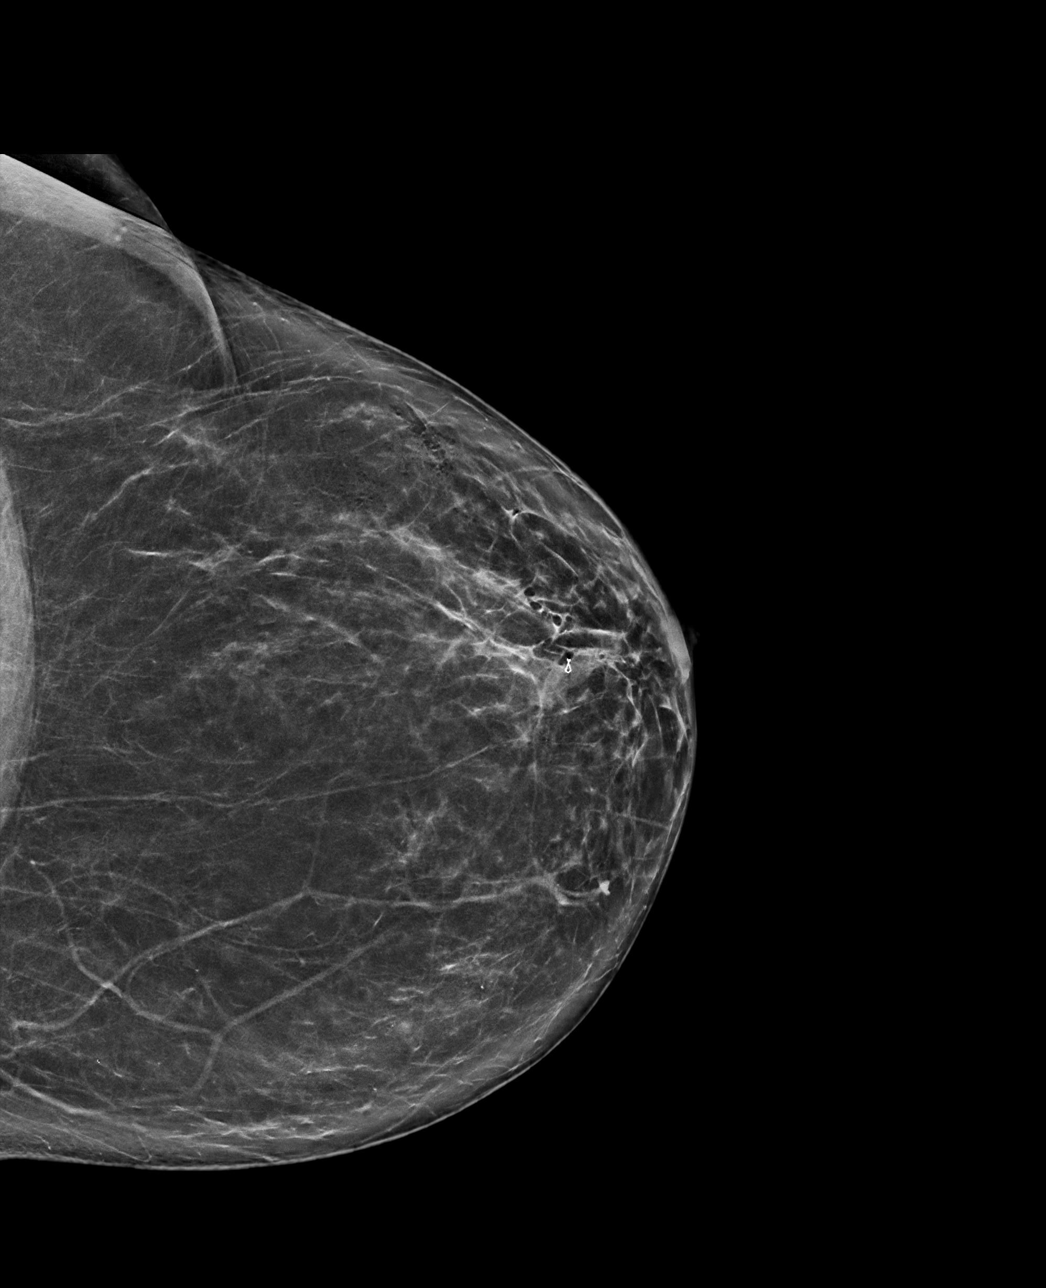

[L ML synth-2D]
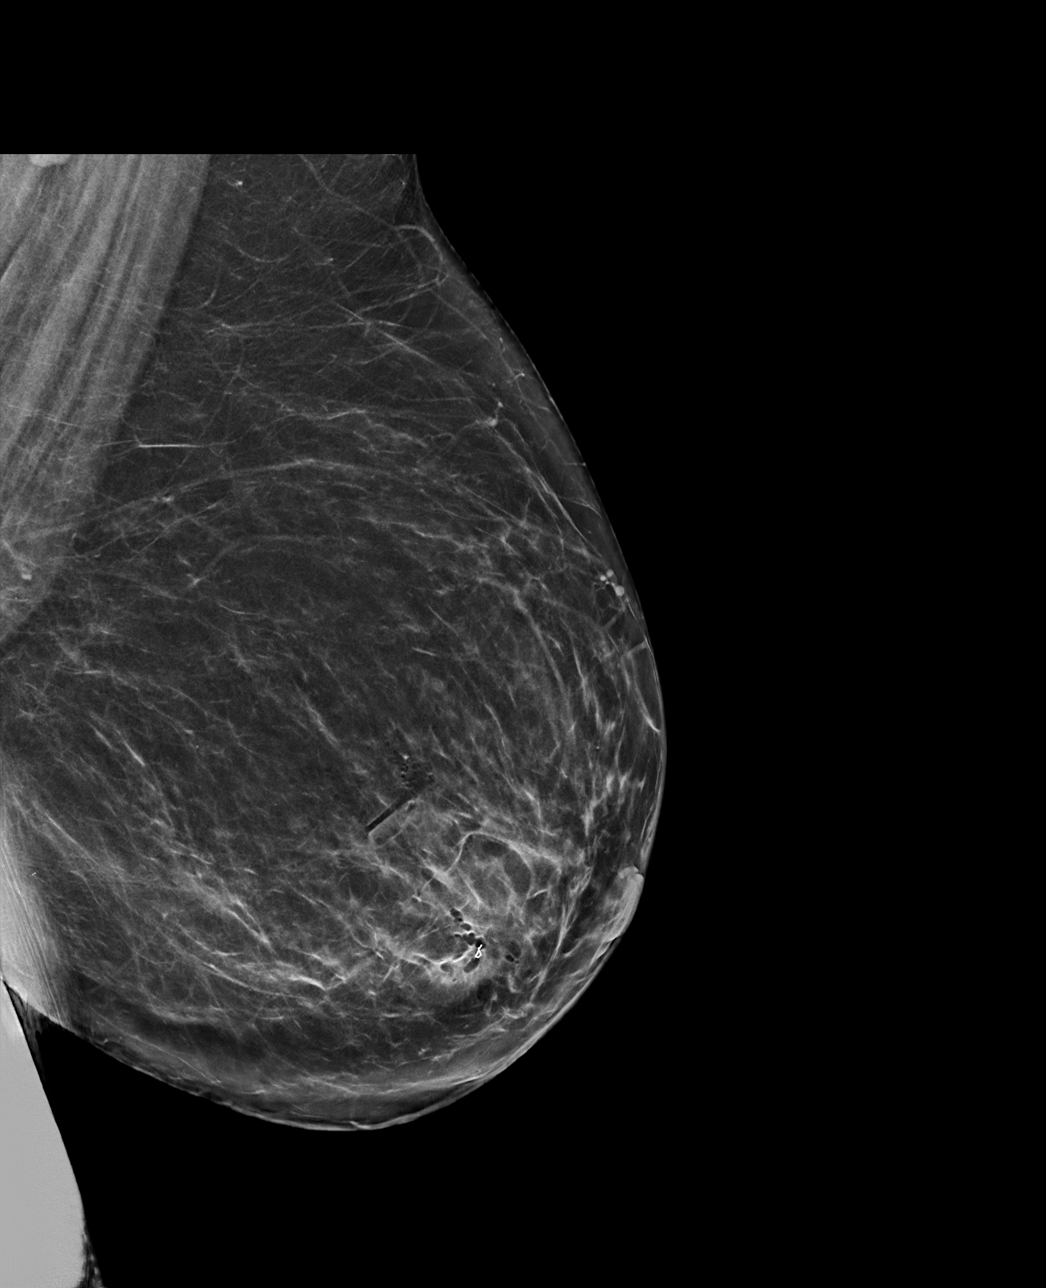

[L CC tomo · tomo slice 37/74.0]
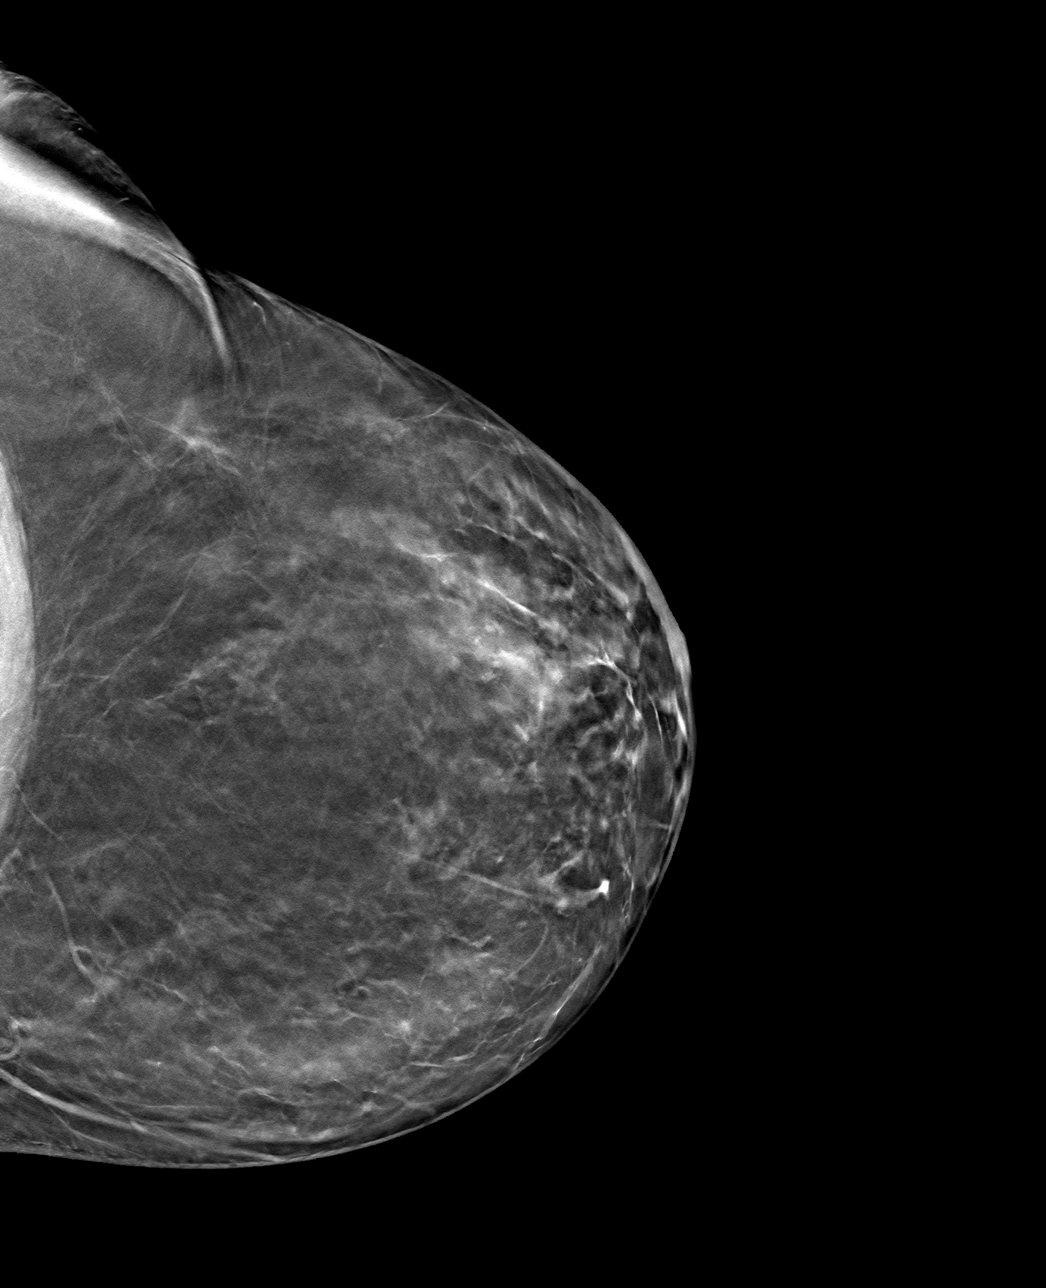

[L ML tomo · tomo slice 39/76.0]
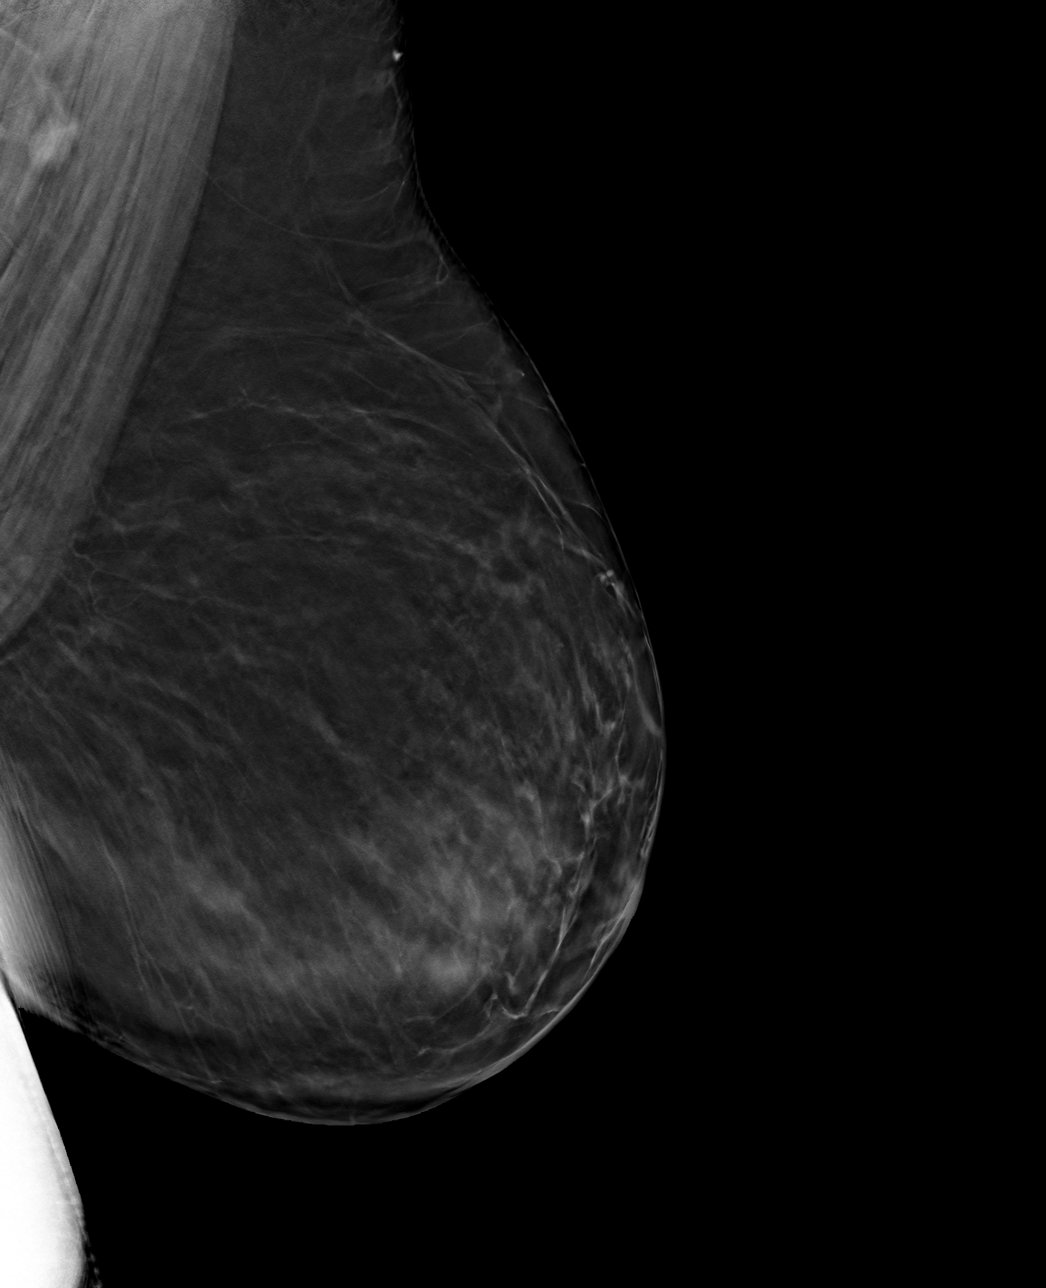

[4 of 12 positions shown; findings below may reference images not displayed]

FINDINGS: 3D Mammographic images were obtained following ultrasound guided
biopsy of a mass in the 5 o'clock region of the left breast. The
biopsy marking clip is in the 5 o'clock region of the left breast.
IMPRESSION: Appropriate positioning of the ribbon shaped biopsy marking clip at
the site of biopsy in the 5 o'clock region of the left breast.

Final Assessment: Post Procedure Mammograms for Marker Placement

## 2022-03-22 ENCOUNTER — Encounter: Payer: Self-pay | Admitting: Physician Assistant

## 2022-03-22 ENCOUNTER — Ambulatory Visit (INDEPENDENT_AMBULATORY_CARE_PROVIDER_SITE_OTHER): Payer: No Typology Code available for payment source | Admitting: Physician Assistant

## 2022-03-22 ENCOUNTER — Other Ambulatory Visit (HOSPITAL_COMMUNITY): Payer: Self-pay

## 2022-03-22 VITALS — BP 110/74 | HR 96 | Temp 98.0°F | Ht 61.0 in | Wt 190.0 lb

## 2022-03-22 DIAGNOSIS — R591 Generalized enlarged lymph nodes: Secondary | ICD-10-CM

## 2022-03-22 DIAGNOSIS — E1169 Type 2 diabetes mellitus with other specified complication: Secondary | ICD-10-CM | POA: Diagnosis not present

## 2022-03-22 LAB — C-REACTIVE PROTEIN: CRP: <1 mg/dL (ref 0.5–20.0)

## 2022-03-22 LAB — CBC WITH DIFFERENTIAL/PLATELET
Basophils Absolute: 0.1 10*3/uL (ref 0.0–0.1)
Basophils Relative: 0.8 % (ref 0.0–3.0)
Eosinophils Absolute: 0.3 10*3/uL (ref 0.0–0.7)
Eosinophils Relative: 3.1 % (ref 0.0–5.0)
HCT: 39.6 % (ref 36.0–46.0)
Hemoglobin: 13.2 g/dL (ref 12.0–15.0)
Lymphocytes Relative: 21.1 % (ref 12.0–46.0)
Lymphs Abs: 1.8 10*3/uL (ref 0.7–4.0)
MCHC: 33.3 g/dL (ref 30.0–36.0)
MCV: 86.5 fl (ref 78.0–100.0)
Monocytes Absolute: 0.5 10*3/uL (ref 0.1–1.0)
Monocytes Relative: 5.9 % (ref 3.0–12.0)
Neutro Abs: 6 10*3/uL (ref 1.4–7.7)
Neutrophils Relative %: 69.1 % (ref 43.0–77.0)
Platelets: 235 10*3/uL (ref 150.0–400.0)
RBC: 4.58 Mil/uL (ref 3.87–5.11)
RDW: 13 % (ref 11.5–15.5)
WBC: 8.7 10*3/uL (ref 4.0–10.5)

## 2022-03-22 LAB — COMPREHENSIVE METABOLIC PANEL
ALT: 20 U/L (ref 0–35)
AST: 14 U/L (ref 0–37)
Albumin: 3.9 g/dL (ref 3.5–5.2)
Alkaline Phosphatase: 91 U/L (ref 39–117)
BUN: 9 mg/dL (ref 6–23)
CO2: 25 mEq/L (ref 19–32)
Calcium: 9.1 mg/dL (ref 8.4–10.5)
Chloride: 100 mEq/L (ref 96–112)
Creatinine, Ser: 0.69 mg/dL (ref 0.40–1.20)
GFR: 106.64 mL/min (ref >60.00)
Glucose, Bld: 249 mg/dL — ABNORMAL HIGH (ref 70–99)
Potassium: 4.2 mEq/L (ref 3.5–5.1)
Sodium: 135 mEq/L (ref 135–145)
Total Bilirubin: 0.3 mg/dL (ref 0.2–1.2)
Total Protein: 6.5 g/dL (ref 6.0–8.3)

## 2022-03-22 LAB — HEMOGLOBIN A1C: Hgb A1c MFr Bld: 7.8 % — ABNORMAL HIGH (ref 4.6–6.5)

## 2022-03-22 LAB — SEDIMENTATION RATE: Sed Rate: 13 mm/hr (ref 0–20)

## 2022-03-22 MED ORDER — IBUPROFEN 800 MG PO TABS
800.0000 mg | ORAL_TABLET | Freq: Three times a day (TID) | ORAL | 3 refills | Status: DC | PRN
  Filled 2022-03-22: qty 90, 30d supply, fill #0

## 2022-03-22 NOTE — Patient Instructions (Signed)
It was great to see you!  For your lymphnode: --You may be getting sick soon with an upper respiratory infection --If symptoms significantly worsen over the weekend, please go to the ER --Continue ibuprofen (high dose sent in for you) --If no improvement by 2 weeks, let me know --Keep me updated on progress via MyChart  For your diabetes: --Update blood work today --We will start 0.25 mg Ozempic weekly -- do not start this until we get your lymphnode under control  Let's follow-up in 2 weeks via Mychart, sooner if you have concerns.  Take care,  Jarold Motto PA-C

## 2022-03-22 NOTE — Progress Notes (Signed)
Andrea [name] is a 41 y.o. female here for a new acute issue and a chronic medical issue follow-up.  History of Present Illness:   Chief Complaint  Patient presents with   Mass    Pt c/o right side of neck swollen and found lymph node Wed night and is getting bigger, also having pain. She has been taking Ibuprofen.    HPI  Lymphadenopathy Patient reports on Wednesday she noticed increased swelling and pain to the right side of her neck. Denies: pain with swallowing, dental infection/pain, fever, chills, night sweats, swollen glands in axilla, sore throat, ear pain, recent infection, known sick contacts, unintentional weight changes. She has taken ibuprofen with some relief of her pain.  Diabetes 12 month follow-up. Current DM meds: metformin xr 500 mg BID but she admits to only taking this in the morning due to diarrhea from the medication. Blood sugars at home are: not regularly checked. Denies: hypoglycemic or hyperglycemic episodes or symptoms.   Lab Results  Component Value Date   HGBA1C 6.0 (H) 03/08/2021    Past Medical History:  Diagnosis Date   Abdominal pain    Anxiety    Carpal tunnel syndrome on both sides    Diabetes mellitus without complication (HCC)    type 2    Headache    High cholesterol    Hyperlipidemia    Status post cesarean delivery 12/28/2019     Social History   Tobacco Use   Smoking status: Every Day    Packs/day: 1.00    Years: 19.00    Total pack years: 19.00    Types: Cigarettes   Smokeless tobacco: Never  Vaping Use   Vaping Use: Never used  Substance Use Topics   Alcohol use: Yes    Alcohol/week: 1.0 standard drink of alcohol    Types: 1 Glasses of wine per week    Comment: very rare   Drug use: No    Past Surgical History:  Procedure Laterality Date   CESAREAN SECTION N/A 12/28/2019   Procedure: CESAREAN SECTION;  Surgeon: Edwinna AreolaBanga, Cecilia Worema, DO;  Location: [facility];  Service: Obstetrics;  Laterality: N/AHerbert Seta;  [name],  RNFA   CHOLECYSTECTOMY  08/05/2011   KIDNEY STONE SURGERY     NM MYOVIEW LTD  05/07/2013   NORMAL STUDY.  Good exercise capacity with normal BP response.  No EKG changes consistent with ischemia.  Normal LV function (EF 60%) with no R WMA.  No Ischemia or Infarction --> LOW RISK    Family History  Problem Relation Age of Onset   Heart attack Mother 656   Early death Mother 8556   Depression Mother    Coronary artery disease Mother 1756   Hyperlipidemia Mother    Heart failure Father 2769   Depression Father    Healthy Brother    Heart disease Maternal Grandmother    Miscarriages / Stillbirths Paternal Grandmother    Heart attack Paternal Grandfather 4850   Early death Paternal Grandfather 3154       Died of complications CAD in his 8950s   Coronary artery disease Paternal Grandfather 7450   Hyperlipidemia Paternal Grandfather    Coronary artery disease Maternal Uncle 42   Heart attack Maternal Uncle 5742   Early death Maternal Uncle        Died in his 6340s from complications of CAD   Heart attack Paternal Aunt        During a stress test   Heart attack Paternal  Uncle    Early death Paternal Uncle        Died in his 8s    Allergies  Allergen Reactions   Wellbutrin [Bupropion] Other (See Comments)    Suicidality    Current Medications:   Current Outpatient Medications:    acetaminophen (TYLENOL) 500 MG tablet, Take 1,000 mg by mouth every 6 (six) hours as needed for mild pain or headache., Disp: , Rfl:    Docosahexaenoic Acid (PRENATAL DHA) 200 MG CAPS, Take 1 capsule (200 mg total) by mouth daily., Disp: 180 capsule, Rfl: 4   folic acid (FOLVITE) 1 MG tablet, Take 1 mg by mouth daily. Pt is taking 1000 mcg daily, Disp: , Rfl:    ibuprofen (ADVIL) 800 MG tablet, Take 1 tablet (800 mg total) by mouth every 8 (eight) hours as needed., Disp: 100 tablet, Rfl: 3   metFORMIN (GLUCOPHAGE-XR) 500 MG 24 hr tablet, TAKE 1 TABLET BY MOUTH 2 TIMES DAILY., Disp: 180 tablet, Rfl: 2   atorvastatin  (LIPITOR) 20 MG tablet, Take 1 tablet (20 mg total) by mouth daily. (Patient not taking: Reported on 03/22/2022), Disp: 90 tablet, Rfl: 3   FLUoxetine (PROZAC) 20 MG capsule, Take 1 capsule (20 mg total) by mouth daily. (Patient not taking: Reported on 03/22/2022), Disp: 90 capsule, Rfl: 1   Review of Systems:   ROS Negative unless otherwise specified per HPI.   Vitals:   Vitals:   03/22/22 1007  BP: 110/74  Pulse: 96  Temp: 98 F (36.7 C)  TempSrc: Temporal  SpO2: 97%  Weight: 190 lb (86.2 kg)  Height: 5\' 1"  (1.549 m)     Body mass index is 35.9 kg/m.  Physical Exam:   Physical Exam Vitals and nursing note reviewed.  Constitutional:      General: She is not in acute distress.    Appearance: She is well-developed. She is not ill-appearing or toxic-appearing.  HENT:     Head: Normocephalic and atraumatic.     Right Ear: Tympanic membrane, ear canal and external ear normal. Tympanic membrane is not erythematous, retracted or bulging.     Left Ear: Tympanic membrane, ear canal and external ear normal. Tympanic membrane is not erythematous, retracted or bulging.     Nose: Nose normal.     Right Sinus: No maxillary sinus tenderness or frontal sinus tenderness.     Left Sinus: No maxillary sinus tenderness or frontal sinus tenderness.     Mouth/Throat:     Pharynx: Uvula midline. No posterior oropharyngeal erythema.  Eyes:     General: Lids are normal.     Conjunctiva/sclera: Conjunctivae normal.  Neck:     Trachea: Trachea normal.  Cardiovascular:     Rate and Rhythm: Normal rate and regular rhythm.     Heart sounds: Normal heart sounds, S1 normal and S2 normal.  Pulmonary:     Effort: Pulmonary effort is normal.     Breath sounds: Normal breath sounds. No decreased breath sounds, wheezing, rhonchi or rales.  Lymphadenopathy:     Cervical: Cervical adenopathy present.     Right cervical: Superficial cervical adenopathy (prominent, very tender) present.     Comments: Exam  of lymph node limited due to significance of pain  Skin:    General: Skin is warm and dry.  Neurological:     Mental Status: She is alert.  Psychiatric:        Speech: Speech normal.        Behavior: Behavior normal. Behavior is  cooperative.     Assessment and Plan:   Lymphadenopathy Discussed that most LAD is due to inflammation/reactive changes. No obvious source of infection on my exam Discussed that she may have some upcoming illness and to be on the look out for this Briefly discussed with Dr. Tana Conch in the office as well Recommend updating blood work today to assess for any significant abnormalities If no symptoms and remains persistently inflamed after the next two weeks, I have asked patient to send [place] a mychart message and then we will obtain u/s If worsening symptoms, recommend reaching out to [place] or seeking urgent/emergent care  Type 2 diabetes mellitus with other specified complication, without long-term current use of insulin (HCC) Update A1c She denies family hx of thyroid cancer or personal hx of pancreatitis -- Ozempic sample provided today -- recommend defer starting this until we get her LAD sorted Continue metformin for now  Jarold Motto, PA-C

## 2022-03-25 ENCOUNTER — Encounter: Payer: Self-pay | Admitting: Physician Assistant

## 2022-04-03 ENCOUNTER — Encounter: Payer: Self-pay | Admitting: Physician Assistant

## 2022-05-22 ENCOUNTER — Encounter: Payer: Self-pay | Admitting: Physician Assistant

## 2022-05-22 ENCOUNTER — Other Ambulatory Visit (HOSPITAL_COMMUNITY): Payer: Self-pay

## 2022-05-22 ENCOUNTER — Telehealth (INDEPENDENT_AMBULATORY_CARE_PROVIDER_SITE_OTHER): Payer: No Typology Code available for payment source | Admitting: Physician Assistant

## 2022-05-22 VITALS — Ht 61.0 in | Wt 180.0 lb

## 2022-05-22 DIAGNOSIS — N951 Menopausal and female climacteric states: Secondary | ICD-10-CM

## 2022-05-22 DIAGNOSIS — R519 Headache, unspecified: Secondary | ICD-10-CM | POA: Diagnosis not present

## 2022-05-22 MED ORDER — CYCLOBENZAPRINE HCL 10 MG PO TABS
ORAL_TABLET | ORAL | 0 refills | Status: DC
  Filled 2022-05-22: qty 30, 10d supply, fill #0

## 2022-05-22 NOTE — Progress Notes (Signed)
Virtual Visit via Video Note   I, , connected with  Andrea [name]  ([id number], 1981/08/17) on 05/22/22 at  2:40 PM EDT by a video-enabled telemedicine application and verified that I am speaking with the correct person using two identifiers.  Location: Patient: Home Provider: [facility] [place] office   I discussed the limitations of evaluation and management by telemedicine and the availability of in person appointments. The patient expressed understanding and agreed to proceed.    History of Present Illness: Andrea [name] is a 41 y.o. who identifies as a female who was assigned female at birth, and is being seen today for:   Vasomotor symptoms Pt is c/o hot flashes x 2-3 weeks. She is also experiencing insomnia and irritability. She has prozac 20 mg but has not taken because she was possibly trying to have another baby soon -- she does not think that she is going to pursue this anymore. She has hx of IUD prior to last child, does not want another IUD. Cannot take OCP due to smoking status. Having regular periods but they are becoming more painful and heavy.   Headache Pt has had a headache everyday for the past month. Headaches are sometimes all over, base of head radiating into neck. She has been taking Ibuprofen 600 mg once a day with relief. She does feel like she has worsening vision and is scheduling an appt to see an eye doctor soon. She denies changes in caffeine. She does need to work on regular food and water intake throughout the day. Denies "worst HA of life" or associated weakness/numbness/tingling.    Problems:  Patient Active Problem List   Diagnosis Date Noted   Depression, major, single episode, mild (HCC) 03/23/2018   COPD (chronic obstructive pulmonary disease) (HCC) 06/18/2017   Carpal tunnel syndrome 06/18/2017   Eczema, dyshidrotic 07/29/2016   GAD (generalized anxiety disorder) 02/22/2016   Diabetes mellitus type 2, uncomplicated (HCC)  02/22/2016   Obstructive sleep apnea 05/02/2013   Obesity 05/02/2013   Tobacco abuse 05/02/2013    Allergies:  Allergies  Allergen Reactions   Wellbutrin [Bupropion] Other (See Comments)    Suicidality   Medications:  Current Outpatient Medications:    acetaminophen (TYLENOL) 500 MG tablet, Take 1,000 mg by mouth every 6 (six) hours as needed for mild pain or headache., Disp: , Rfl:    cyclobenzaprine (FLEXERIL) 10 MG tablet, Take 1/2 to 1 tablet (5-10 mg) by mouth up to three times daily for headache or neck pain., Disp: 30 tablet, Rfl: 0   Docosahexaenoic Acid (PRENATAL DHA) 200 MG CAPS, Take 1 capsule (200 mg total) by mouth daily., Disp: 180 capsule, Rfl: 4   folic acid (FOLVITE) 1 MG tablet, Take 1 mg by mouth daily. Pt is taking 1000 mcg daily, Disp: , Rfl:    ibuprofen (ADVIL) 800 MG tablet, Take 1 tablet (800 mg total) by mouth every 8 (eight) hours as needed., Disp: 100 tablet, Rfl: 3   metFORMIN (GLUCOPHAGE-XR) 500 MG 24 hr tablet, TAKE 1 TABLET BY MOUTH 2 TIMES DAILY. (Patient taking differently: Take 500 mg by mouth daily with breakfast.), Disp: 180 tablet, Rfl: 2   atorvastatin (LIPITOR) 20 MG tablet, Take 1 tablet (20 mg total) by mouth daily. (Patient not taking: Reported on 03/22/2022), Disp: 90 tablet, Rfl: 3   FLUoxetine (PROZAC) 20 MG capsule, Take 1 capsule (20 mg total) by mouth daily. (Patient not taking: Reported on 03/22/2022), Disp: 90 capsule, Rfl: 1  Observations/Objective:  Patient is well-developed, well-nourished in no acute distress.  Resting comfortably  at home.  Head is normocephalic, atraumatic.  No labored breathing.  Speech is clear and coherent with logical content.  Patient is alert and oriented at baseline.   Assessment and Plan: 1. Perimenopausal vasomotor symptoms Uncontrolled Recommend starting prozac 20 mg daily and follow-up if this does not help Also consider in office evaluation for blood work if worsening or lack of improvement  2.  Nonintractable headache, unspecified chronicity pattern, unspecified headache type No red flags Recommend eye exam, adequate water and food intake throughout the day Try to limit NSAIDs Trial 5-10 mg flexeril prn for tension Hopefully sleep will improve with prozac and this can also help symptoms If lack of improvement or worsening, follow-up in office  Follow Up Instructions: I discussed the assessment and treatment plan with the patient. The patient was provided an opportunity to ask questions and all were answered. The patient agreed with the plan and demonstrated an understanding of the instructions.  A copy of instructions were sent to the patient via MyChart unless otherwise noted below.   The patient was advised to call back or seek an in-person evaluation if the symptoms worsen or if the condition fails to improve as anticipated.  Jarold Motto, [place]

## 2022-07-08 ENCOUNTER — Encounter: Payer: Self-pay | Admitting: *Deleted

## 2022-08-28 ENCOUNTER — Ambulatory Visit (INDEPENDENT_AMBULATORY_CARE_PROVIDER_SITE_OTHER): Payer: No Typology Code available for payment source

## 2022-08-28 DIAGNOSIS — Z23 Encounter for immunization: Secondary | ICD-10-CM

## 2022-09-04 ENCOUNTER — Other Ambulatory Visit (HOSPITAL_COMMUNITY): Payer: Self-pay

## 2022-09-04 ENCOUNTER — Telehealth: Payer: No Typology Code available for payment source | Admitting: Physician Assistant

## 2022-09-04 DIAGNOSIS — R6889 Other general symptoms and signs: Secondary | ICD-10-CM | POA: Diagnosis not present

## 2022-09-04 MED ORDER — OSELTAMIVIR PHOSPHATE 75 MG PO CAPS
75.0000 mg | ORAL_CAPSULE | Freq: Two times a day (BID) | ORAL | 0 refills | Status: DC
  Filled 2022-09-04: qty 10, 5d supply, fill #0

## 2022-09-04 NOTE — Progress Notes (Signed)
Virtual Visit Consent   Andrea [name], you are scheduled for a virtual visit with a [facility] provider today. Just as with appointments in the office, your consent must be obtained to participate. Your consent will be active for this visit and any virtual visit you may have with one of our providers in the next 365 days. If you have a MyChart account, a copy of this consent can be sent to you electronically.  As this is a virtual visit, video technology does not allow for your provider to perform a traditional examination. This may limit your provider's ability to fully assess your condition. If your provider identifies any concerns that need to be evaluated in person or the need to arrange testing (such as labs, EKG, etc.), we will make arrangements to do so. Although advances in technology are sophisticated, we cannot ensure that it will always work on either your end or our end. If the connection with a video visit is poor, the visit may have to be switched to a telephone visit. With either a video or telephone visit, we are not always able to ensure that we have a secure connection.  By engaging in this virtual visit, you consent to the provision of healthcare and authorize for your insurance to be billed (if applicable) for the services provided during this visit. Depending on your insurance coverage, you may receive a charge related to this service.  I need to obtain your verbal consent now. Are you willing to proceed with your visit today? Andrea [name] has provided verbal consent on 09/04/2022 for a virtual visit (video or telephone). Leeanne Rio, [place]  Date: 09/04/2022 5:10 PM  Virtual Visit via Video Note   I, Leeanne Rio, connected with  Andrea [name]  ([id number], 1980/11/27) on 09/04/22 at  5:45 PM EST by a video-enabled telemedicine application and verified that I am speaking with the correct person using two identifiers.  Location: Patient:  Virtual Visit Location Patient: Home Provider: Virtual Visit Location Provider: Home Office   I discussed the limitations of evaluation and management by telemedicine and the availability of in person appointments. The patient expressed understanding and agreed to proceed.    History of Present Illness: Andrea [name] is a 41 y.o. who identifies as a female who was assigned female at birth, and is being seen today for exposure to flu with onset of symptoms today including cough, fatigue and chest congestion. Denies fever, SOB. Tested for COVID to be cautious and negative. Daughter tested positive for the flu today at pediatricians office and started on tamiflu.   HPI: HPI  Problems:  Patient Active Problem List   Diagnosis Date Noted   Depression, major, single episode, mild ([place]) 03/23/2018   COPD (chronic obstructive pulmonary disease) ([place]) 06/18/2017   Carpal tunnel syndrome 06/18/2017   Eczema, dyshidrotic 07/29/2016   GAD (generalized anxiety disorder) 02/22/2016   Diabetes mellitus type 2, uncomplicated ([place]) [id number]   Obstructive sleep apnea 05/02/2013   Obesity 05/02/2013   Tobacco abuse 05/02/2013    Allergies:  Allergies  Allergen Reactions   Wellbutrin [Bupropion] Other (See Comments)    Suicidality   Medications:  Current Outpatient Medications:    oseltamivir (TAMIFLU) 75 MG capsule, Take 1 capsule (75 mg total) by mouth 2 (two) times daily., Disp: 10 capsule, Rfl: 0   acetaminophen (TYLENOL) 500 MG tablet, Take 1,000 mg by mouth every 6 (six) hours as needed for mild pain or headache., Disp: ,  Rfl:    atorvastatin (LIPITOR) 20 MG tablet, Take 1 tablet (20 mg total) by mouth daily. (Patient not taking: Reported on 03/22/2022), Disp: 90 tablet, Rfl: 3   cyclobenzaprine (FLEXERIL) 10 MG tablet, Take 1/2 to 1 tablet (5-10 mg) by mouth up to three times daily for headache or neck pain., Disp: 30 tablet, Rfl: 0   Docosahexaenoic Acid (PRENATAL DHA) 200 MG CAPS, Take  1 capsule (200 mg total) by mouth daily., Disp: 180 capsule, Rfl: 4   FLUoxetine (PROZAC) 20 MG capsule, Take 1 capsule (20 mg total) by mouth daily. (Patient not taking: Reported on 03/22/2022), Disp: 90 capsule, Rfl: 1   folic acid (FOLVITE) 1 MG tablet, Take 1 mg by mouth daily. Pt is taking 1000 mcg daily, Disp: , Rfl:    ibuprofen (ADVIL) 800 MG tablet, Take 1 tablet (800 mg total) by mouth every 8 (eight) hours as needed., Disp: 100 tablet, Rfl: 3   metFORMIN (GLUCOPHAGE-XR) 500 MG 24 hr tablet, TAKE 1 TABLET BY MOUTH 2 TIMES DAILY. (Patient taking differently: Take 500 mg by mouth daily with breakfast.), Disp: 180 tablet, Rfl: 2  Observations/Objective: Patient is well-developed, well-nourished in no acute distress.  Resting comfortably at home.  Head is normocephalic, atraumatic.  No labored breathing. Speech is clear and coherent with logical content.  Patient is alert and oriented at baseline.   Assessment and Plan: 1. Flu-like symptoms  With known household exposure. COVID negative. Giving medical history will stat Tamiflu. Supportive measures and OTC medications reviewed. Strict precautions for in-person evaluation discussed.   Follow Up Instructions: I discussed the assessment and treatment plan with the patient. The patient was provided an opportunity to ask questions and all were answered. The patient agreed with the plan and demonstrated an understanding of the instructions.  A copy of instructions were sent to the patient via MyChart unless otherwise noted below.   The patient was advised to call back or seek an in-person evaluation if the symptoms worsen or if the condition fails to improve as anticipated.  Time:  I spent 8 minutes with the patient via telehealth technology discussing the above problems/concerns.    Piedad Climes, PA-C

## 2022-09-04 NOTE — Patient Instructions (Signed)
Andrea [name], thank you for joining Andrea Climes, PA-C for today's virtual visit.  While this provider is not your primary care provider (PCP), if your PCP is located in our provider database this encounter information will be shared with them immediately following your visit.   A [facility] MyChart account gives you access to today's visit and all your visits, tests, and labs performed at [facility] " click here if you don't have a [facility] MyChart account or go to [email or web address]  Consent: (Patient) Andrea [name] provided verbal consent for this virtual visit at the beginning of the encounter.  Current Medications:  Current Outpatient Medications:    oseltamivir (TAMIFLU) 75 MG capsule, Take 1 capsule (75 mg total) by mouth 2 (two) times daily for 5 days., Disp: 10 capsule, Rfl: 0   acetaminophen (TYLENOL) 500 MG tablet, Take 1,000 mg by mouth every 6 (six) hours as needed for mild pain or headache., Disp: , Rfl:    atorvastatin (LIPITOR) 20 MG tablet, Take 1 tablet (20 mg total) by mouth daily. (Patient not taking: Reported on 03/22/2022), Disp: 90 tablet, Rfl: 3   cyclobenzaprine (FLEXERIL) 10 MG tablet, Take 1/2 to 1 tablet (5-10 mg) by mouth up to three times daily for headache or neck pain., Disp: 30 tablet, Rfl: 0   Docosahexaenoic Acid (PRENATAL DHA) 200 MG CAPS, Take 1 capsule (200 mg total) by mouth daily., Disp: 180 capsule, Rfl: 4   FLUoxetine (PROZAC) 20 MG capsule, Take 1 capsule (20 mg total) by mouth daily. (Patient not taking: Reported on 03/22/2022), Disp: 90 capsule, Rfl: 1   folic acid (FOLVITE) 1 MG tablet, Take 1 mg by mouth daily. Pt is taking 1000 mcg daily, Disp: , Rfl:    ibuprofen (ADVIL) 800 MG tablet, Take 1 tablet (800 mg total) by mouth every 8 (eight) hours as needed., Disp: 100 tablet, Rfl: 3   metFORMIN (GLUCOPHAGE-XR) 500 MG 24 hr tablet, TAKE 1 TABLET BY MOUTH 2 TIMES DAILY. (Patient taking differently: Take 500  mg by mouth daily with breakfast.), Disp: 180 tablet, Rfl: 2   Medications ordered in this encounter:  Meds ordered this encounter  Medications   oseltamivir (TAMIFLU) 75 MG capsule    Sig: Take 1 capsule (75 mg total) by mouth 2 (two) times daily for 5 days.    Dispense:  10 capsule    Refill:  0    Order Specific Question:   Supervising Provider    Answer:   Merrilee Jansky [id number]     *If you need refills on other medications prior to your next appointment, please contact your pharmacy*  Follow-Up: Call back or seek an in-person evaluation if the symptoms worsen or if the condition fails to improve as anticipated.  [facility] [phone]  Other Instructions   We are sorry that you are not feeling well.  Here is how we plan to help! Based on what you have shared with me it looks like you may have a respiratory virus that may be influenza.  Influenza or "the flu" is   an infection caused by a respiratory virus. The flu virus is highly contagious and persons who did not receive their yearly flu vaccination may "catch" the flu from close contact.  We have anti-viral medications to treat the viruses that cause this infection. They are not a "cure" and only shorten the course of the infection. These prescriptions are most effective when they are given within the  first 2 days of "flu" symptoms. Antiviral medication are indicated if you have a high risk of complications from the flu. You should  also consider an antiviral medication if you are in close contact with someone who is at risk. These medications can help patients avoid complications from the flu  but have side effects that you should know. Possible side effects from Tamiflu or oseltamivir include nausea, vomiting, diarrhea, dizziness, headaches, eye redness, sleep problems or other respiratory symptoms. You should not take Tamiflu if you have an allergy to oseltamivir or any to the ingredients in  Tamiflu.  Based upon your symptoms and potential risk factors I have prescribed Oseltamivir (Tamiflu).  It has been sent to your designated pharmacy.  You will take one 75 mg capsule orally twice a day for the next 5 days.  ANYONE WHO HAS FLU SYMPTOMS SHOULD: Stay home. The flu is highly contagious and going out or to work exposes others! Be sure to drink plenty of fluids. Water is fine as well as fruit juices, sodas and electrolyte beverages. You may want to stay away from caffeine or alcohol. If you are nauseated, try taking small sips of liquids. How do you know if you are getting enough fluid? Your urine should be a pale yellow or almost colorless. Get rest. Taking a steamy shower or using a humidifier may help nasal congestion and ease sore throat pain. Using a saline nasal spray works much the same way. Cough drops, hard candies and sore throat lozenges may ease your cough. Line up a caregiver. Have someone check on you regularly.   GET HELP RIGHT AWAY IF: You cannot keep down liquids or your medications. You become short of breath Your fell like you are going to pass out or loose consciousness. Your symptoms persist after you have completed your treatment plan MAKE SURE YOU  Understand these instructions. Will watch your condition. Will get help right away if you are not doing well or get worse.     If you have been instructed to have an in-person evaluation today at a local Urgent Care facility, please use the link below. It will take you to a list of all of our available [facility], including address, phone number and hours of operation. Please do not delay care.  [facility]  If you or a family member do not have a primary care provider, use the link below to schedule a visit and establish care. When you choose a [facility] primary care physician or advanced practice provider, you gain a long-term partner in health. Find a Primary Care  Provider  Learn more about [name] in-office and virtual care options: [facility] - Get Care Now

## 2022-09-11 ENCOUNTER — Encounter: Payer: Self-pay | Admitting: Physician Assistant

## 2022-09-11 ENCOUNTER — Ambulatory Visit (INDEPENDENT_AMBULATORY_CARE_PROVIDER_SITE_OTHER): Payer: No Typology Code available for payment source | Admitting: Physician Assistant

## 2022-09-11 ENCOUNTER — Other Ambulatory Visit (HOSPITAL_COMMUNITY): Payer: Self-pay

## 2022-09-11 VITALS — BP 116/80 | HR 105 | Temp 97.7°F | Ht 61.0 in | Wt 186.5 lb

## 2022-09-11 DIAGNOSIS — R051 Acute cough: Secondary | ICD-10-CM

## 2022-09-11 LAB — POC INFLUENZA A&B (BINAX/QUICKVUE)
Influenza A, POC: NEGATIVE
Influenza B, POC: NEGATIVE

## 2022-09-11 LAB — POC COVID19 BINAXNOW: SARS Coronavirus 2 Ag: NEGATIVE

## 2022-09-11 MED ORDER — FLUCONAZOLE 150 MG PO TABS
150.0000 mg | ORAL_TABLET | Freq: Once | ORAL | 0 refills | Status: AC
  Filled 2022-09-11: qty 1, 1d supply, fill #0

## 2022-09-11 MED ORDER — AZITHROMYCIN 250 MG PO TABS
ORAL_TABLET | ORAL | 0 refills | Status: AC
  Filled 2022-09-11: qty 6, 5d supply, fill #0

## 2022-09-11 NOTE — Patient Instructions (Addendum)
It was great to see you!  64 oz water daily Continue ibuprofen  You may begin the antibiotic if no improvement in a few days, but if you develop fever, worsening cough or sinus pressure, begin the antibiotic and take as directed. Follow-up with PCP in 7-10 days if no improvement, sooner if needed.  Take care,  Jarold Motto PA-C

## 2022-09-11 NOTE — Progress Notes (Signed)
Andrea [name] is a 41 y.o. female here for a follow up of a pre-existing problem.  History of Present Illness:   Chief Complaint  Patient presents with   Cough    Pt c/o cough, congestion, body aches, diarrhea. Pt had virtual visit last Friday 11/22 and was prescribed Tamiflu completed medication, but symptoms have gotten worse. Kids had COVID  and Flu. Home COVID test was Neg a week ago.    HPI  Cough Patient had a virtual visit on 11/22 for flu-like symptoms and was prescribed Tamiflu with no relief. She then got sick two days later with congestion, fever, body aches, diarrhea for 2 days, nausea, and a cough. She is not experiencing diarrhea or nausea today. Patient states that she is eating and drinking regularly, but not as normal. Patient took a Covid test at home a week ago which was negative. Her children at home have Covid and the flu. She manages her symptoms with ibuprofen. She is a smoker.  She denies any urinary tract symptoms, back pain, and flank pain.   Past Medical History:  Diagnosis Date   Abdominal pain    Anxiety    Carpal tunnel syndrome on both sides    Diabetes mellitus without complication (HCC)    type 2    Headache    High cholesterol    Hyperlipidemia    Status post cesarean delivery 12/28/2019     Social History   Tobacco Use   Smoking status: Every Day    Packs/day: 1.00    Years: 19.00    Total pack years: 19.00    Types: Cigarettes   Smokeless tobacco: Never  Vaping Use   Vaping Use: Never used  Substance Use Topics   Alcohol use: Yes    Alcohol/week: 1.0 standard drink of alcohol    Types: 1 Glasses of wine per week    Comment: very rare   Drug use: No    Past Surgical History:  Procedure Laterality Date   CESAREAN SECTION N/A 12/28/2019   Procedure: CESAREAN SECTION;  Surgeon: Edwinna Areola, DO;  Location: [facility] LD ORS;  Service: Obstetrics;  Laterality: N/AHerbert Seta, RNFA   CHOLECYSTECTOMY  08/05/2011   KIDNEY  STONE SURGERY     NM MYOVIEW LTD  05/07/2013   NORMAL STUDY.  Good exercise capacity with normal BP response.  No EKG changes consistent with ischemia.  Normal LV function (EF 60%) with no R WMA.  No Ischemia or Infarction --> LOW RISK    Family History  Problem Relation Age of Onset   Heart attack Mother 65   Early death Mother 32   Depression Mother    Coronary artery disease Mother 5   Hyperlipidemia Mother    Heart failure Father 61   Depression Father    Healthy Brother    Heart disease Maternal Grandmother    Miscarriages / Stillbirths Paternal Grandmother    Heart attack Paternal Grandfather 87   Early death Paternal Grandfather 100       Died of complications CAD in his 31s   Coronary artery disease Paternal Grandfather 53   Hyperlipidemia Paternal Grandfather    Coronary artery disease Maternal Uncle 42   Heart attack Maternal Uncle 73   Early death Maternal Uncle        Died in his 34s from complications of CAD   Heart attack Paternal Aunt        During a stress test   Heart attack  Paternal Uncle    Early death Paternal Uncle        Died in his 58s    Allergies  Allergen Reactions   Wellbutrin [Bupropion] Other (See Comments)    Suicidality    Current Medications:   Current Outpatient Medications:    acetaminophen (TYLENOL) 500 MG tablet, Take 1,000 mg by mouth every 6 (six) hours as needed for mild pain or headache., Disp: , Rfl:    FLUoxetine (PROZAC) 20 MG capsule, Take 1 capsule (20 mg total) by mouth daily., Disp: 90 capsule, Rfl: 1   metFORMIN (GLUCOPHAGE-XR) 500 MG 24 hr tablet, TAKE 1 TABLET BY MOUTH 2 TIMES DAILY. (Patient taking differently: Take 500 mg by mouth daily with breakfast.), Disp: 180 tablet, Rfl: 2   atorvastatin (LIPITOR) 20 MG tablet, Take 1 tablet (20 mg total) by mouth daily. (Patient not taking: Reported on 03/22/2022), Disp: 90 tablet, Rfl: 3   Review of Systems:   Review of Systems  Constitutional:  Positive for fever.  HENT:   Positive for congestion.   Respiratory:  Positive for cough.   Genitourinary:  Negative for flank pain.  Musculoskeletal:  Negative for back pain.    Vitals:   Vitals:   09/11/22 1545  BP: 116/80  Pulse: (!) 105  Temp: 97.7 F (36.5 C)  TempSrc: Temporal  SpO2: 96%  Weight: 186 lb 8 oz (84.6 kg)  Height: 5\' 1"  (1.549 m)     Body mass index is 35.24 kg/m.  Physical Exam:   Physical Exam Vitals and nursing note reviewed.  Constitutional:      General: She is not in acute distress.    Appearance: Normal appearance. She is well-developed. She is not ill-appearing or toxic-appearing.  HENT:     Head: Normocephalic and atraumatic.     Right Ear: Tympanic membrane, ear canal and external ear normal. Tympanic membrane is not erythematous, retracted or bulging.     Left Ear: Tympanic membrane, ear canal and external ear normal. Tympanic membrane is not erythematous, retracted or bulging.     Nose: Nose normal.     Right Sinus: No maxillary sinus tenderness or frontal sinus tenderness.     Left Sinus: No maxillary sinus tenderness or frontal sinus tenderness.     Mouth/Throat:     Pharynx: Uvula midline. No posterior oropharyngeal erythema.  Eyes:     General: Lids are normal.     Extraocular Movements: Extraocular movements intact.     Conjunctiva/sclera: Conjunctivae normal.     Pupils: Pupils are equal, round, and reactive to light.  Neck:     Trachea: Trachea normal.  Cardiovascular:     Rate and Rhythm: Normal rate and regular rhythm.     Heart sounds: Normal heart sounds, S1 normal and S2 normal. No murmur heard.    No gallop.  Pulmonary:     Effort: Pulmonary effort is normal. No respiratory distress.     Breath sounds: Normal breath sounds. No decreased breath sounds, wheezing, rhonchi or rales.  Lymphadenopathy:     Cervical: No cervical adenopathy.  Skin:    General: Skin is warm and dry.  Neurological:     Mental Status: She is alert and oriented to person,  place, and time.  Psychiatric:        Speech: Speech normal.        Behavior: Behavior normal. Behavior is cooperative.        Judgment: Judgment normal.    Results for orders placed or  performed in visit on 09/11/22  POC Influenza A&B(BINAX/QUICKVUE)  Result Value Ref Range   Influenza A, POC Negative Negative   Influenza B, POC Negative Negative  POC COVID-19  Result Value Ref Range   SARS Coronavirus 2 Ag Negative Negative     Assessment and Plan:   Acute cough COVID and flu test negative No red flags on discussion, patient is not in any obvious distress during our visit. Discussed progression of most viral illness, and recommended supportive care at this point in time. I did however provide pocket rx for oral azithromycin should symptoms not improve as anticipated. Discussed over the counter supportive care options, with recommendations to push fluids and rest. Reviewed return precautions including new/worsening fever, SOB, new/worsening cough or other concerns.  Recommended need to self-quarantine and practice social distancing until symptoms resolve. Discussed current recommendations for COVID testing. I recommend that patient follow-up if symptoms worsen or persist despite treatment x 7-10 days, sooner if needed.  I,Verona Buck,acting as a Neurosurgeon for [employer], PA.,have documented all relevant documentation on the behalf of Jarold Motto, PA,as directed by  Jarold Motto, PA while in the presence of Jarold Motto, [place].  I, Jarold Motto, [place], have reviewed all documentation for this visit. The documentation on 09/11/22 for the exam, diagnosis, procedures, and orders are all accurate and complete.  Jarold Motto, PA-C

## 2022-10-15 ENCOUNTER — Telehealth: Payer: Self-pay | Admitting: Physician Assistant

## 2022-10-15 NOTE — Telephone Encounter (Signed)
Patient states on 10/12/22 she may have been bitten by spider/bug on left hand, which formed a bump and itched (looked like a mosquito bite with a red dot in the center). 10/13/22 bump on hand changed to a rash , then the itchy rash spread to  left wrist and then spread from wrist to the elbows (underside) on both arms, then spread to top of both hands, stomach, back, top of legs, hips, head, neck, behind both ears and forehead.  Transferred to Triage.

## 2022-10-15 NOTE — Telephone Encounter (Signed)
Patient is requesting to transfer care from [facility] to [facility]. States she wants family to have the same PCP. Is this okay with you?

## 2022-10-16 ENCOUNTER — Encounter: Payer: Self-pay | Admitting: Family Medicine

## 2022-10-16 ENCOUNTER — Ambulatory Visit (INDEPENDENT_AMBULATORY_CARE_PROVIDER_SITE_OTHER): Payer: 59 | Admitting: Family Medicine

## 2022-10-16 ENCOUNTER — Emergency Department (HOSPITAL_BASED_OUTPATIENT_CLINIC_OR_DEPARTMENT_OTHER)
Admission: EM | Admit: 2022-10-16 | Discharge: 2022-10-17 | Discharge status: Against Medical Advice | Payer: 59 | Attending: Emergency Medicine | Admitting: Emergency Medicine

## 2022-10-16 ENCOUNTER — Encounter (HOSPITAL_BASED_OUTPATIENT_CLINIC_OR_DEPARTMENT_OTHER): Payer: Self-pay

## 2022-10-16 ENCOUNTER — Other Ambulatory Visit: Payer: Self-pay

## 2022-10-16 ENCOUNTER — Emergency Department (HOSPITAL_BASED_OUTPATIENT_CLINIC_OR_DEPARTMENT_OTHER): Payer: 59 | Admitting: Radiology

## 2022-10-16 VITALS — BP 122/68 | HR 112 | Temp 98.9°F | Ht 61.0 in | Wt 186.8 lb

## 2022-10-16 DIAGNOSIS — R0789 Other chest pain: Secondary | ICD-10-CM | POA: Diagnosis not present

## 2022-10-16 DIAGNOSIS — R079 Chest pain, unspecified: Secondary | ICD-10-CM | POA: Insufficient documentation

## 2022-10-16 DIAGNOSIS — Z5321 Procedure and treatment not carried out due to patient leaving prior to being seen by health care provider: Secondary | ICD-10-CM | POA: Insufficient documentation

## 2022-10-16 DIAGNOSIS — E1169 Type 2 diabetes mellitus with other specified complication: Secondary | ICD-10-CM | POA: Diagnosis not present

## 2022-10-16 DIAGNOSIS — R21 Rash and other nonspecific skin eruption: Secondary | ICD-10-CM | POA: Diagnosis not present

## 2022-10-16 LAB — POCT GLYCOSYLATED HEMOGLOBIN (HGB A1C): Hemoglobin A1C: 8.4 % — AB (ref 4.0–5.6)

## 2022-10-16 LAB — CBC
HCT: 41.4 % (ref 36.0–46.0)
Hemoglobin: 13.6 g/dL (ref 12.0–15.0)
MCH: 28.6 pg (ref 26.0–34.0)
MCHC: 32.9 g/dL (ref 30.0–36.0)
MCV: 87 fL (ref 80.0–100.0)
Platelets: 292 10*3/uL (ref 150–400)
RBC: 4.76 MIL/uL (ref 3.87–5.11)
RDW: 12.6 % (ref 11.5–15.5)
WBC: 7.9 10*3/uL (ref 4.0–10.5)
nRBC: 0 % (ref 0.0–0.2)

## 2022-10-16 LAB — BASIC METABOLIC PANEL
Anion gap: 13 (ref 5–15)
BUN: 13 mg/dL (ref 6–20)
CO2: 25 mmol/L (ref 22–32)
Calcium: 9.4 mg/dL (ref 8.9–10.3)
Chloride: 100 mmol/L (ref 98–111)
Creatinine, Ser: 0.69 mg/dL (ref 0.44–1.00)
GFR, Estimated: >60 mL/min (ref >60)
Glucose, Bld: 217 mg/dL — ABNORMAL HIGH (ref 70–99)
Potassium: 4.3 mmol/L (ref 3.5–5.1)
Sodium: 138 mmol/L (ref 135–145)

## 2022-10-16 LAB — GLUCOSE, POCT (MANUAL RESULT ENTRY): POC Glucose: 254 mg/dl — AB (ref 70–99)

## 2022-10-16 LAB — TROPONIN I (HIGH SENSITIVITY): Troponin I (High Sensitivity): <2 ng/L (ref <18)

## 2022-10-16 MED ORDER — OZEMPIC (0.25 OR 0.5 MG/DOSE) 2 MG/3ML ~~LOC~~ SOPN: 0.2500 mg | PEN_INJECTOR | SUBCUTANEOUS | 1 refills | Status: DC

## 2022-10-16 MED ORDER — PREDNISONE 20 MG PO TABS
ORAL_TABLET | ORAL | 0 refills | Status: AC
  Filled 2022-10-16: qty 16, 9d supply, fill #0

## 2022-10-16 MED ORDER — OZEMPIC (0.25 OR 0.5 MG/DOSE) 2 MG/3ML ~~LOC~~ SOPN
0.2500 mg | PEN_INJECTOR | SUBCUTANEOUS | 1 refills | Status: DC
  Filled 2022-10-16: qty 3, 30d supply, fill #0
  Filled 2022-11-11: qty 3, 30d supply, fill #1

## 2022-10-16 MED ORDER — PREDNISONE 20 MG PO TABS: ORAL_TABLET | ORAL | 0 refills | Status: DC

## 2022-10-16 NOTE — Progress Notes (Signed)
Subjective:  Patient ID: Andrea [name], female    DOB: June 14, 1981  Age: 42 y.o. MRN: [id number]  CC:  Chief Complaint  Patient presents with   Rash    Rash starting 10/13/22 pt reports spread across scalp, neck, arms, torso, back and most of legs no zyrtec or benadryl, tried cream and calamine lotion no improvement     HPI Andrea [name] presents for   Pruritic rash: Started 4  days ago. Initial possible mosquito bite on thumb. Itching, then scalp, neck, arms, torso, back and legs. All itchy. No genital or mouth lesions.   No wheezing/stridor/dyspnea or tongue swelling.  Attempted treatments including calamine lotion, over-the-counter hydrocortisone cream 3-4 times.  No improvement. No new derm products, or detergent. No recent travel.No contact with new plants or poison ivy. Viral illness late November.  Negative COVID/flu testing at that time.   Chest Pressure Left chest pressure started last night watching tv. Comes and goes. No change with activity, minimal currently. Stress testing in 2014 ok - had for similar sx's.  No associated n/v, but some diaphoresis last night. Did have some associated shortness of breath with chest pressure last night, but not now.  Slight dizziness - could drink more water. No arm radiation.  No personal hx of heart issues, but mom passed at 56yo with MI.  Maternal uncle 2 heart attacks, paternal uncle with heart attacks, father with CHF.  Initially noted mild symptoms in the office but later during history stated that symptoms had resolved temporarily, then recurrence throughout office visit, but mild.   Hx of DM - has meter at home. No recent readings.  Metformin 500 mg daily, but has been taking intermittently. Last took few days ago. 3-4 pills per week. Stomach upset with intermittent dosing. Would like to try ozempic instead. On insulin few years ago when pregnant, not since.  New PCP appt 1/15.  No family history of multiple  endocrine neoplasia or medullary thyroid carcinoma, no personal history of pancreatitis. Lab Results  Component Value Date   HGBA1C 7.8 (H) 03/22/2022   History Patient Active Problem List   Diagnosis Date Noted   Depression, major, single episode, mild (HCC) 03/23/2018   COPD (chronic obstructive pulmonary disease) (HCC) 06/18/2017   Carpal tunnel syndrome 06/18/2017   Eczema, dyshidrotic 07/29/2016   GAD (generalized anxiety disorder) 02/22/2016   Diabetes mellitus type 2, uncomplicated (HCC) 02/22/2016   Obstructive sleep apnea 05/02/2013   Obesity 05/02/2013   Tobacco abuse 05/02/2013   Past Medical History:  Diagnosis Date   Abdominal pain    Anxiety    Carpal tunnel syndrome on both sides    Diabetes mellitus without complication (HCC)    type 2    Headache    High cholesterol    Hyperlipidemia    Status post cesarean delivery 12/28/2019   Past Surgical History:  Procedure Laterality Date   CESAREAN SECTION N/A 12/28/2019   Procedure: CESAREAN SECTION;  Surgeon: Edwinna Areola, DO;  Location: [facility] LD ORS;  Service: Obstetrics;  Laterality: N/AHerbert [name], RNFA   CHOLECYSTECTOMY  08/05/2011   KIDNEY STONE SURGERY     NM MYOVIEW LTD  05/07/2013   NORMAL STUDY.  Good exercise capacity with normal BP response.  No EKG changes consistent with ischemia.  Normal LV function (EF 60%) with no R WMA.  No Ischemia or Infarction --> LOW RISK   Allergies  Allergen Reactions   Wellbutrin [Bupropion] Other (See Comments)  Suicidality   Prior to Admission medications   Medication Sig Start Date End Date Taking? Authorizing Provider  acetaminophen (TYLENOL) 500 MG tablet Take 1,000 mg by mouth every 6 (six) hours as needed for mild pain or headache.    [provider]  atorvastatin (LIPITOR) 20 MG tablet Take 1 tablet (20 mg total) by mouth daily. Patient not taking: Reported on 03/22/2022 12/31/21   Inda Coke, PA  FLUoxetine (PROZAC) 20 MG capsule Take 1  capsule (20 mg total) by mouth daily. 12/31/21   Inda Coke, PA  metFORMIN (GLUCOPHAGE-XR) 500 MG 24 hr tablet TAKE 1 TABLET BY MOUTH 2 TIMES DAILY. Patient taking differently: Take 500 mg by mouth daily with breakfast. 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   Lives with her husband and both their 3 children.   She is a stay-at-home mom.   Smoker ~ 1 PPD x 30 yr.    Occasional EtOH only   No other drugs.    ~1 can of soda per day       She is admittedly is not very active.  Does not do routine exercise. ->  Walking maybe 15 to 20 minutes at a time 3 to 4 days a week.  Relatively recent change.   Social Determinants of Health   Financial Resource Strain: Not on file  Food Insecurity: Not on file  Transportation Needs: Not on file  Physical Activity: Not on file  Stress: Not on file  Social Connections: Not on file  Intimate Partner Violence: Not on file    Review of Systems Per HPI.   Objective:   Vitals:   10/16/22 1149  BP: 122/68  Pulse: (!) 112  Temp: 98.9 F (37.2 C)  TempSrc: Oral   SpO2: 96%  Weight: 186 lb 12.8 oz (84.7 kg)  Height: 5\' 1"  (1.549 m)     Physical Exam Vitals reviewed.  Constitutional:      General: She is not in acute distress.    Appearance: Normal appearance. She is well-developed. She is not ill-appearing, toxic-appearing or diaphoretic.  HENT:     Head: Normocephalic and atraumatic.  Eyes:     Conjunctiva/sclera: Conjunctivae normal.     Pupils: Pupils are equal, round, and reactive to light.  Neck:     Vascular: No carotid bruit.  Cardiovascular:     Rate and Rhythm: Normal rate and regular rhythm.  Heart sounds: Normal heart sounds.  Pulmonary:     Effort: Pulmonary effort is normal.     Comments: Slight faint wheeze on the left but cleared with repeat exam.  Otherwise lungs clear, speaking full sentences without respiratory distress.  No stridor. Abdominal:     Palpations: Abdomen is soft. There is no pulsatile mass.     Tenderness: There is no abdominal tenderness.  Musculoskeletal:     Right lower leg: No edema.     Left lower leg: No edema.  Skin:    General: Skin is warm and dry.  Neurological:     Mental Status: She is alert and oriented to person, place, and time.  Psychiatric:        Mood and Affect: Mood normal.        Behavior: Behavior normal.    EKG: Sinus rhythm, rate 90.  Nonspecific T wave with possible inversion in lead IRI, V4 through V6.  Appeared to be flat or upright in comparison EKG from April 13, 2021.   Photos of rash of scalp, left neck/ear, abdominal wall.  Similar clusters of fine papules on knee, thighs, lateral hip on left.       Results for orders placed or performed in visit on 10/16/22  POCT glucose (manual entry)  Result Value Ref Range   POC Glucose 254 (A) 70 - 99 mg/dl  Multiple times at in office A1c, unable to achieve result on machine, error messages. Addendum - A1c 8.4.   81 minutes spent during visit, including chart review, counseling and assimilation of information, exam,  multiple follow-ups and discussion of plan, changes of plans based on results, and chart completion.    Assessment & Plan:  Andrea [name] is a 42 y.o. female . Left chest pressure - Plan: EKG 12-Lead  -Initially presented for rash as above but did note intermittent left-sided pressure since yesterday, nonexertional.  Has had persistent symptoms into the day with some initial diaphoresis, lightheadedness.  Mild symptoms currently.  Possible new T wave inversions seen on EKG as above, but subtle changes.  Given family history and personal history of diabetes, further evaluation recommended through ER.  Recommended ER eval after our office visit with 911 precautions given.  Understanding of plan expressed.  Rash and nonspecific skin eruption - Plan: predniSONE (DELTASONE) 20 MG tablet  -Less likely viral exanthem given time since viral illness but still in differential.  Possible ID reaction versus allergic reaction.  Due to diffuse rash and spread, planned on prednisone with potential side effects and risk discussed.  Unfortunately elevated blood sugar today in the mid 200s, unable to check A1c in office due to machine error codes initially. Concerned that prednisone may cause significant hyperglycemia.   Intolerant to metformin as above.  Infrequent dosing.  Requests Ozempic.  Ozempic written starting dose with potential side effects and risk discussed.  Plans to start Zyrtec with Benadryl at night, then prednisone if not improving.  Close monitoring of home blood sugars if she does start prednisone with ER/RTC precautions.  Understanding expressed.  Type 2 diabetes mellitus with other specified complication, without long-term current use of insulin (HCC) - Plan: POCT glucose (manual entry), POCT glycosylated hemoglobin (Hb A1C) As above, A1C 8.4 once A1c machine was functional.  Intolerant to metformin.  Start Ozempic and follow-up with new PCP as planned.  Potential side effects of GLP-1  discussed.  Meds ordered this encounter  Medications   predniSONE (DELTASONE) 20 MG tablet  Sig: 3 by mouth for 3 days, then 2 by mouth for 2 days, then 1 by mouth for 2 days, then 1/2 by mouth for 2 days.    Dispense:  16 tablet    Refill:  0   Patient Instructions  Rash could be in response to something you came in contact with, but I do not see a specific trigger at this time.  Start prednisone as we discussed, check blood sugars at least once per day and if any readings over 200, let me know.  Okay to continue hydrocortisone cream, over-the-counter Zyrtec once per day may also be helpful for the itching.  Follow-up if rash is not improving or new/worsening symptoms.  In regards to the chest pressure, I do see some possible changes in your EKG.  Further testing will need to be performed to the ER.  Please proceed there immediately after leaving our office.  There will be notes in the system regarding my concerns but they will independently assess your symptoms and decide on the next step or testing.  Thanks for coming in today and please let me know if there are questions.     Signed,   Merri Ray, MD [place] 10/16/22 1:10 PM

## 2022-10-16 NOTE — ED Triage Notes (Signed)
Patient here POV from Home.  Endorses CP that began Yesterday PM. Intermittent since it began. Visited PCP today and had an "Abnormal ECG" thus prompting visit.   Pressure-Like. No SOB. No fevers. No Cough. No N/V.   NAD Noted during Triage. A&Ox4. GCS 15. Ambulatory.

## 2022-10-16 NOTE — Telephone Encounter (Addendum)
Pt is scheduled to see Dr Carlota Raspberry today.   Patient Name: Andrea [name] [facility] Gender: Female DOB: 1981/02/18 Age: 42 Y 63 M 10 D Return Phone Number: [phone] (Primary) Address: City/ State/ Zip: [place]  [address] Client [place] [facility] [place] Client Site [place] [facility] [place] [facility] Provider Morene Rankins, [place]- PA Contact Type Call Who Is Calling Patient / Member / Family / Caregiver Call Type Triage / Clinical Relationship To Patient Self Return Phone Number [phone] (Primary) Chief Complaint Spider Bite Reason for Call Symptomatic / Request for [place] states she is calling in regards to a patient on 12/30 may have been bitten by a spider/bug. Caller states it formed a bump and itched and on 12/31 the bump on the hand change to a rash, rash spread from wrist to the elbow on both arms Caller states it is not on, both hands stomach, back, top of legs, hips, head, neck, behind both ears, and forehead. Caller states it is small bumps. Translation No Nurse Assessment Nurse: Moshe Cipro, RN, Debra Date/Time (Eastern Time): 10/15/2022 4:16:28 PM Confirm and document reason for call. If symptomatic, describe symptoms. ---Caller states has rash everywhere and itches and is raised, had it since 12/30, small red dots. Does the patient have any new or worsening symptoms? ---Yes Will a triage be completed? ---Yes Related visit to physician within the last 2 weeks? ---No Does the PT have any chronic conditions? (i.e. diabetes, asthma, this includes High risk factors for pregnancy, etc.) ---No Is the patient pregnant or possibly pregnant? (Ask all females between the ages of 80-55) ---No Is this a behavioral health or substance abuse call? ---No Guidelines Guideline Title Affirmed Question Affirmed Notes Nurse Date/Time (Eastern Time) Rash or Redness - Widespread SEVERE itching (i.e., interferes  with sleep, Moshe Cipro, RN, Hilda Blades 10/15/2022 4:20:22 PM Guidelines Guideline Title Affirmed Question Affirmed Notes Nurse Date/Time (Eastern Time) normal activities or school) Disp. Time Eilene Ghazi Time) Disposition Final User 10/15/2022 4:23:55 PM See PCP within 24 Hours Yes Moshe Cipro, RN, Hilda Blades Final Disposition 10/15/2022 4:23:55 PM See PCP within 24 Hours Yes Moshe Cipro, RN, Carmela Rima Disagree/Comply Comply Caller Understands Yes PreDisposition InappropriateToAsk Care Advice Given Per Guideline SEE PCP WITHIN 24 HOURS: * IF OFFICE WILL BE OPEN: You need to be examined within the next 24 hours. Call your doctor (or NP/PA) when the office opens and make an appointment. * IF OFFICE WILL BE CLOSED: You need to be seen within the next 24 hours. A clinic or an urgent care center is often a good source of care if your doctor's office is closed or you can't get an appointment. CALL BACK IF: * Rash becomes purple or blood-colored or blister-like * Fever occurs * You become worse CARE ADVICE given per Rash - Widespread and Cause Unknown (Adult) guideline. Referrals REFERRED TO PCP OFFICE

## 2022-10-16 NOTE — Patient Instructions (Addendum)
Rash could be in response to something you came in contact with, but I do not see a specific trigger at this time.  Okay to continue hydrocortisone cream, over-the-counter Zyrtec once per day may also be helpful for the itching with benadryl once per night if needed.   Unfortunately I am unable to check your 30-month blood sugar test today to determine overall average of blood sugars and I am concerned that prednisone may increase your blood sugar to a dangerous level.  Please have blood sugar rechecked in the ER and discuss rash and plan further based on their testing.  I ordered Ozempic, but let us know if that is not covered. If you do start prednisone, monitor blood sugars at least twice per day and if readings over 250, stop prednisone and contact your medical provider.  I will call you later if I have other information regarding this plan.  In regards to the chest pressure, I do see some possible changes in your EKG.  Further testing will need to be performed to the ER.  Please proceed there immediately after leaving our office. If any worsening symptoms call 911. There will be notes in the system regarding my concerns but they will independently discuss your symptoms and decide on the next step or testing.  Thanks for coming in today and please let me know if there are questions. Take care.   Nonspecific Chest Pain, Adult Chest pain is an uncomfortable, tight, or painful feeling in the chest. The pain can feel like a crushing, aching, or squeezing pressure. A person can feel a burning or tingling sensation. Chest pain can also be felt in your back, neck, jaw, shoulder, or arm. This pain can be worse when you move, sneeze, or take a deep breath. Chest pain can be caused by a condition that is life-threatening. This must be treated right away. It can also be caused by something that is not life-threatening. If you have chest pain, it can be hard to know the difference, so it is important to get help  right away to make sure that you do not have a serious condition. Some life-threatening causes of chest pain include: Heart attack. A tear in the body's main blood vessel (aortic dissection). Inflammation around your heart (pericarditis). A problem in the lungs, such as a blood clot (pulmonary embolism) or a collapsed lung (pneumothorax). Some non life-threatening causes of chest pain include: Heartburn. Anxiety or stress. Damage to the bones, muscles, and cartilage that make up your chest wall. Pneumonia or bronchitis. Shingles infection (varicella-zoster virus). Your chest pain may come and go. It may also be constant. Your health care provider will do tests and other studies to find the cause of your pain. Treatment will depend on the cause of your chest pain. Follow these instructions at home: Medicines Take over-the-counter and prescription medicines only as told by your health care provider. If you were prescribed an antibiotic medicine, take it as told by your health care provider. Do not stop taking the antibiotic even if you start to feel better. Activity Avoid any activities that cause chest pain. Do not lift anything that is heavier than 10 lb (4.5 kg), or the limit that you are told, until your health care provider says that it is safe. Rest as directed by your health care provider. Return to your normal activities only as told by your health care provider. Ask your health care provider what activities are safe for you. Lifestyle  Do not use any products that contain nicotine or tobacco, such as cigarettes, e-cigarettes, and chewing tobacco. If you need help quitting, ask your health care provider. Do not drink alcohol. Make healthy lifestyle changes as recommended. These may include: Getting regular exercise. Ask your health care provider to suggest some exercises that are safe for you. Eating a heart-healthy diet. This includes plenty of fresh fruits and vegetables,  whole grains, low-fat (lean) protein, and low-fat dairy products. A dietitian can help you find healthy eating options. Maintaining a healthy weight. Managing any other health conditions you may have, such as high blood pressure (hypertension) or diabetes. Reducing stress, such as with yoga or relaxation techniques. General instructions Pay attention to any changes in your symptoms. It is up to you to get the results of any tests that were done. Ask your health care provider, or the department that is doing the tests, when your results will be ready. Keep all follow-up visits as told by your health care provider. This is important. You may be asked to go for further testing if your chest pain does not go away. Contact a health care provider if: Your chest pain does not go away. You feel depressed. You have a fever. You notice changes in your symptoms or develop new symptoms. Get help right away if: Your chest pain gets worse. You have a cough that gets worse, or you cough up blood. You have severe pain in your abdomen. You faint. You have sudden, unexplained chest discomfort. You have sudden, unexplained discomfort in your arms, back, neck, or jaw. You have shortness of breath at any time. You suddenly start to sweat, or your skin gets clammy. You feel nausea or you vomit. You suddenly feel lightheaded or dizzy. You have severe weakness, or unexplained weakness or fatigue. Your heart begins to beat quickly, or it feels like it is skipping beats. These symptoms may represent a serious problem that is an emergency. Do not wait to see if the symptoms will go away. Get medical help right away. Call your local emergency services (911 in the [place] Do not drive yourself to the hospital. Summary Chest pain can be caused by a condition that is serious and requires urgent treatment. It may also be caused by something that is not life-threatening. Your health care provider may do lab tests and  other studies to find the cause of your pain. Follow your health care provider's instructions on taking medicines, making lifestyle changes, and getting emergency treatment if symptoms become worse. Keep all follow-up visits as told by your health care provider. This includes visits for any further testing if your chest pain does not go away. This information is not intended to replace advice given to you by your health care provider. Make sure you discuss any questions you have with your health care provider. Document Revised: 12/14/2020 Document Reviewed: 12/14/2020 Elsevier Patient Education  [place].   Rash, Adult A rash is a change in the color of your skin. A rash can also change the way your skin feels. There are many different conditions and factors that can cause a rash. Some rashes may disappear after a few days, but some may last for a few weeks. Common causes of rashes include: Viral infections, such as: Colds. Measles. Hand, foot, and mouth disease. Bacterial infections, such as: Scarlet fever. Impetigo. Fungal infections, such as Candida. Allergic reactions to food, medicines, or skin care products. Follow these instructions at home: The goal of treatment  is to stop the itching and keep the rash from spreading. Pay attention to any changes in your symptoms. Follow these instructions to help with your condition: Medicine Take or apply over-the-counter and prescription medicines only as told by your health care provider. These may include: Corticosteroid creams to treat red or swollen skin. Anti-itch lotions. Oral allergy medicines (antihistamines). Oral corticosteroids for severe symptoms.  Skin care Apply cool compresses to the affected areas. Do not scratch or rub your skin. Avoid covering the rash. Make sure the rash is exposed to air as much as possible. Managing itching and discomfort Avoid hot showers or baths, which can make itching worse. A cold shower  may help. Try taking a bath with: Epsom salts. Follow manufacturer instructions on the packaging. You can get these at your local pharmacy or grocery store. Baking soda. Pour a small amount into the bath as told by your health care provider. Colloidal oatmeal. Follow manufacturer instructions on the packaging. You can get this at your local pharmacy or grocery store. Try applying baking soda paste to your skin. Stir water into baking soda until it reaches a paste-like consistency. Try applying calamine lotion. This is an over-the-counter lotion that helps to relieve itchiness. Keep cool and out of the sun. Sweating and being hot can make itching worse. General instructions  Rest as needed. Drink enough fluid to keep your urine pale yellow. Wear loose-fitting clothing. Avoid scented soaps, detergents, and perfumes. Use gentle soaps, detergents, perfumes, and other cosmetic products. Avoid any substance that causes your rash. Keep a journal to help track what causes your rash. Write down: What you eat. What cosmetic products you use. What you drink. What you wear. This includes jewelry. Keep all follow-up visits as told by your health care provider. This is important. Contact a health care provider if: You sweat at night. You lose weight. You urinate more than normal. You urinate less than normal, or you notice that your urine is a darker color than usual. You feel weak. You vomit. Your skin or the whites of your eyes look yellow (jaundice). Your skin: Tingles. Is numb. Your rash: Does not go away after several days. Gets worse. You are: Unusually thirsty. More tired than normal. You have: New symptoms. Pain in your abdomen. A fever. Diarrhea. Get help right away if you: Have a fever and your symptoms suddenly get worse. Develop confusion. Have a severe headache or a stiff neck. Have severe joint pains or stiffness. Have a seizure. Develop a rash that covers all or most  of your body. The rash may or may not be painful. Develop blisters that: Are on top of the rash. Grow larger or grow together. Are painful. Are inside your nose or mouth. Develop a rash that: Looks like purple pinprick-sized spots all over your body. Has a "bull's eye" or looks like a target. Is not related to sun exposure, is red and painful, and causes your skin to peel. Summary A rash is a change in the color of your skin. Some rashes disappear after a few days, but some may last for a few weeks. The goal of treatment is to stop the itching and keep the rash from spreading. Take or apply over-the-counter and prescription medicines only as told by your health care provider. Contact a health care provider if you have new or worsening symptoms. Keep all follow-up visits as told by your health care provider. This is important. This information is not intended to replace advice given  to you by your health care provider. Make sure you discuss any questions you have with your health care provider. Document Revised: 04/02/2022 Document Reviewed: 07/12/2021 Elsevier Patient Education  [place].

## 2022-10-16 NOTE — Telephone Encounter (Signed)
FYI, pt is scheduled to see Dr. Carlota Raspberry today.

## 2022-10-17 ENCOUNTER — Other Ambulatory Visit (HOSPITAL_COMMUNITY): Payer: Self-pay

## 2022-10-17 ENCOUNTER — Ambulatory Visit: Payer: 59 | Admitting: Physician Assistant

## 2022-10-17 ENCOUNTER — Other Ambulatory Visit: Payer: Self-pay

## 2022-10-22 NOTE — Progress Notes (Unsigned)
Cardiology Clinic Note   Patient Name: Kruthi Bondoc [facility] Date of Encounter: 10/22/2022  Primary Care Provider:  Inda Coke, [place] Primary Cardiologist:  Glenetta Hew, MD  Patient Profile    Ranae Plumber Devoto is a 42 y.o. female with a past medical history of hyperlipidemia, chest pain, tachycardia, OSA, T2DM who presents to the clinic today for follow-up of abnormal EKG.   Past Medical History    Past Medical History:  Diagnosis Date   Abdominal pain    Anxiety    Carpal tunnel syndrome on both sides    Diabetes mellitus without complication (HCC)    type 2    Headache    High cholesterol    Hyperlipidemia    Status post cesarean delivery 12/28/2019   Past Surgical History:  Procedure Laterality Date   CESAREAN SECTION N/A 12/28/2019   Procedure: CESAREAN SECTION;  Surgeon: Sherlyn Hay, DO;  Location: [facility] LD ORS;  Service: Obstetrics;  Laterality: N/ANira Conn, RNFA   CHOLECYSTECTOMY  08/05/2011   KIDNEY STONE SURGERY     NM MYOVIEW LTD  05/07/2013   NORMAL STUDY.  Good exercise capacity with normal BP response.  No EKG changes consistent with ischemia.  Normal LV function (EF 60%) with no R WMA.  No Ischemia or Infarction --> LOW RISK    Allergies  Allergies  Allergen Reactions   Wellbutrin [Bupropion] Other (See Comments)    Suicidality    History of Present Illness    Dorianne Jarrett Soho Cromley has a past medical history of: Hyperlipidemia.  Lipid panel 03/08/2021: LDL 147, HDL 36, TG 207, total 217.  Chest pain.  Nuclear stress test 05/07/2013: Normal study.  Tachycardia.  3-day ZIO 05/01/2021: Predominant rhythm is sinus rhythm min 59 bpm, max 144 bpm, avg 94 bpm. No arrhythmias. Rare ectopy.  16 triggered events mostly with sinus rhythm, occasionally with PAC or PVC.  T2DM.  OSA.  Patient is known to cardiology with first evaluation by Dr. Sallyanne Kuster in July 2014 for chest pain. She had a normal nuclear stress test at that time. She was  evaluated and found to have OSA by Dr. Claiborne Billings. She was not seen again until July 2022 by Dr. Ellyn Hack for dizziness and tachycardia. Monitor did not show arrhythmias.   Most recently, patient was evaluated in the ED for a one day history of chest pain and abnormal EKG at PCP office. ***  Today, patient ***  Abnormal EKG. *** Hyperlipidemia. LDL May 2022 147, not at goal. ***  Home Medications    No outpatient medications have been marked as taking for the 10/23/22 encounter (Appointment) with Deberah Pelton, NP.    Family History    Family History  Problem Relation Age of Onset   Heart attack Mother 70   Early death Mother 12   Depression Mother    Coronary artery disease Mother 63   Hyperlipidemia Mother    Heart failure Father 45   Depression Father    Healthy Brother    Heart disease Maternal Grandmother    Miscarriages / Stillbirths Paternal Grandmother    Heart attack Paternal Grandfather 36   Early death Paternal Grandfather 61       Died of complications CAD in his 40s   Coronary artery disease Paternal Grandfather 75   Hyperlipidemia Paternal Grandfather    Coronary artery disease Maternal Uncle 69   Heart attack Maternal Uncle 20   Early death Maternal Uncle  Died in his 25s from complications of CAD   Heart attack Paternal Aunt        During a stress test   Heart attack Paternal Uncle    Early death Paternal Uncle        Died in his 37s   She indicated that her mother is deceased. She indicated that her father is alive. She indicated that her brother is alive. She indicated that her maternal grandmother is deceased. She indicated that her maternal grandfather is deceased. She indicated that her paternal grandmother is deceased. She indicated that her paternal grandfather is deceased. She indicated that her daughter is alive. She indicated that her son is deceased. She indicated that her maternal uncle is alive. She indicated that her paternal aunt is alive.  She indicated that her paternal uncle is deceased.   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   Lives with her husband and both their 3 children.   She is a stay-at-home mom.   Smoker ~ 1 PPD x 30 yr.    Occasional EtOH only   No other drugs.    ~1 can of soda per day       She is admittedly is not very active.  Does not do routine exercise. ->  Walking maybe 15 to 20 minutes at a time 3 to 4 days a week.  Relatively recent change.   Social Determinants of Health   Financial Resource Strain: Not on file  Food Insecurity: Not on file  Transportation Needs: Not on file  Physical Activity: Not on file  Stress: Not on file  Social Connections: Not on file  Intimate Partner Violence: Not on file     Review of Systems    General: *** No chills, fever, night sweats or weight changes.  Cardiovascular:  No chest pain, dyspnea on exertion, edema, orthopnea, palpitations, paroxysmal nocturnal dyspnea. Dermatological: No rash, lesions/masses Respiratory: No cough, dyspnea Urologic: No hematuria, dysuria Abdominal:   No nausea,  vomiting, diarrhea, bright red blood per rectum, melena, or hematemesis Neurologic:  No visual changes, weakness, changes in mental status. All other systems reviewed and are otherwise negative except as noted above.  Physical Exam    VS:  There were no vitals taken for this visit. , BMI There is no height or weight on file to calculate BMI. GEN: *** Well nourished, well developed, in no acute distress. HEENT: Normal. Neck:  Supple, no JVD, carotid bruits, or masses. Cardiac: RRR, no murmurs, rubs, or gallops. No clubbing, cyanosis, edema.  Radials/DP/PT 2+ and equal bilaterally.  Respiratory:  Respirations regular and unlabored, clear to auscultation bilaterally. GI: Soft, nontender, nondistended. MS: No deformity or atrophy. Skin: Warm and dry, no rash. Neuro: Strength and sensation are intact. Psych: Normal affect.  Accessory Clinical Findings    The following studies were reviewed for this visit: ***  Recent Labs: 03/22/2022: ALT 20 10/16/2022: BUN 13; Creatinine, Ser 0.69; Hemoglobin 13.6; Platelets 292; Potassium 4.3; Sodium 138   Recent Lipid Panel    Component Value Date/Time   CHOL 217 (H) 03/08/2021 0817   TRIG 207 (H) 03/08/2021 0817   HDL 36 (L) 03/08/2021 0817   CHOLHDL 6.0 (H) 03/08/2021 0817   VLDL 20 10/28/2016 0807   LDLCALC 147 (H) 03/08/2021 0817   LDLDIRECT 171.2 12/29/2009 0851    No BP recorded.  {Refresh Note OR Click here to enter BP  :1}***    ECG personally reviewed by me today: ***  No significant changes from ***  {Does this patient have ATRIAL FIBRILLATION?[phone]}   Assessment & Plan   ***      Disposition: ***   Justice Britain. Fischer Halley, DNP, NP-C     10/22/2022, 5:35 PM [place] [address] Office [phone] Fax [phone]

## 2022-10-23 ENCOUNTER — Other Ambulatory Visit: Payer: Self-pay

## 2022-10-23 ENCOUNTER — Encounter: Payer: Self-pay | Admitting: General Practice

## 2022-10-23 ENCOUNTER — Other Ambulatory Visit (HOSPITAL_COMMUNITY): Payer: Self-pay

## 2022-10-23 ENCOUNTER — Ambulatory Visit: Payer: 59 | Attending: General Practice | Admitting: Student

## 2022-10-23 VITALS — BP 114/76 | HR 101 | Ht 61.0 in | Wt 185.2 lb

## 2022-10-23 DIAGNOSIS — R072 Precordial pain: Secondary | ICD-10-CM | POA: Diagnosis not present

## 2022-10-23 DIAGNOSIS — E785 Hyperlipidemia, unspecified: Secondary | ICD-10-CM

## 2022-10-23 DIAGNOSIS — Z720 Tobacco use: Secondary | ICD-10-CM | POA: Diagnosis not present

## 2022-10-23 MED ORDER — ATORVASTATIN CALCIUM 40 MG PO TABS
40.0000 mg | ORAL_TABLET | Freq: Every day | ORAL | 6 refills | Status: DC
  Filled 2022-10-23: qty 30, 30d supply, fill #0
  Filled 2022-11-11 – 2022-12-05 (×2): qty 30, 30d supply, fill #1
  Filled 2023-01-23: qty 30, 30d supply, fill #2
  Filled 2023-03-12: qty 30, 30d supply, fill #3
  Filled 2023-04-25: qty 30, 30d supply, fill #4
  Filled 2023-06-20: qty 60, 60d supply, fill #5

## 2022-10-23 MED ORDER — METOPROLOL TARTRATE 100 MG PO TABS
100.0000 mg | ORAL_TABLET | Freq: Once | ORAL | 0 refills | Status: DC
  Filled 2022-10-23: qty 1, 1d supply, fill #0

## 2022-10-23 NOTE — Patient Instructions (Addendum)
Medication Instructions:  TAKE ATORVASTATIN (LIPITOR) 40MG  DAILY *If you need a refill on your cardiac medications before your next appointment, please call your pharmacy*  Lab Work: FASTING LIPID AND LFT IN 8-10 WEEKS If you have labs (blood work) drawn today and your tests are completely normal, you will receive your results only by: [place] (if you have MyChart) OR A paper copy in the mail If you have any lab test that is abnormal or we need to change your treatment, we will call you to review the results.  Testing/Procedures: CORONARY CT   Follow-Up: At [facility], you and your health needs are our priority.  As part of our continuing mission to provide you with exceptional heart care, we have created designated Provider Care Teams.  These Care Teams include your primary Cardiologist (physician) and Advanced Practice Providers (APPs -  Physician Assistants and Nurse Practitioners) who all work together to provide you with the care you need, when you need it.  Your next appointment:   AFTER CT   Provider:   Mayra Reel, NP       Other Instructions    Your cardiac CT will be scheduled at one of the below locations:   [facility] [address] [place], [place] [address] [phone]  [place] [address] [place], [place] [address] [phone]  [facility] [place], [place] [address] [phone]  If scheduled at [facility], please arrive at the [facility] and Children's Entrance (Entrance C2) of [facility] 30 minutes prior to test start time. You can use the FREE valet parking offered at entrance C (encouraged to control the heart rate for the test)  Proceed to the [facility] Radiology Department (first floor) to check-in and test prep.  All radiology patients and guests should use entrance C2 at [facility], accessed from [facility], even though the hospital's physical address listed is [address]    If scheduled at [facility] or [facility], please arrive 15 mins early for check-in and test prep.   Please follow these instructions carefully (unless otherwise directed):  Hold all erectile dysfunction medications at least 3 days (72 hrs) prior to test. (Ie viagra, cialis, sildenafil, tadalafil, etc) We will administer nitroglycerin during this exam.   On the Night Before the Test: Be sure to Drink plenty of water. Do not consume any caffeinated/decaffeinated beverages or chocolate 12 hours prior to your test. Do not take any antihistamines 12 hours prior to your test. If the patient has contrast allergy:  On the Day of the Test: Drink plenty of water until 1 hour prior to the test. Do not eat any food 1 hour prior to test. You may take your regular medications prior to the test.  Take metoprolol (Lopressor) two hours prior to test. HOLD Furosemide/Hydrochlorothiazide morning of the test. FEMALES- please wear underwire-free bra if available, avoid dresses & tight clothing  After the Test: Drink plenty of water. After receiving IV contrast, you may experience a mild flushed feeling. This is normal. On occasion, you may experience a mild rash up to 24 hours after the test. This is not dangerous. If this occurs, you can take Benadryl 25 mg and increase your fluid intake. If you experience trouble breathing, this can be serious. If it is severe call 911 IMMEDIATELY. If it  is mild, please call our office. If you take any of these medications: Glipizide/Metformin, Avandament, Glucavance, please do not take 48 hours after completing test unless otherwise instructed.  We will call to schedule your test 2-4 weeks out understanding that some insurance companies will need an authorization prior to the service being  performed.   For non-scheduling related questions, please contact the cardiac imaging nurse navigator should you have any questions/concerns: Marchia Bond, Cardiac Imaging Nurse Navigator Gordy Clement, Cardiac Imaging Nurse Navigator [facility] Direct Office Dial: [phone]   For scheduling needs, including cancellations and rescheduling, please call [place], [phone].

## 2022-10-28 ENCOUNTER — Other Ambulatory Visit (HOSPITAL_COMMUNITY): Payer: Self-pay

## 2022-10-28 ENCOUNTER — Encounter: Payer: Self-pay | Admitting: Physician Assistant

## 2022-10-28 ENCOUNTER — Ambulatory Visit (INDEPENDENT_AMBULATORY_CARE_PROVIDER_SITE_OTHER): Payer: 59 | Admitting: Physician Assistant

## 2022-10-28 VITALS — BP 112/74 | HR 100 | Temp 97.3°F | Ht 61.0 in | Wt 185.0 lb

## 2022-10-28 DIAGNOSIS — Z720 Tobacco use: Secondary | ICD-10-CM

## 2022-10-28 DIAGNOSIS — E1169 Type 2 diabetes mellitus with other specified complication: Secondary | ICD-10-CM | POA: Diagnosis not present

## 2022-10-28 DIAGNOSIS — E785 Hyperlipidemia, unspecified: Secondary | ICD-10-CM

## 2022-10-28 DIAGNOSIS — R0789 Other chest pain: Secondary | ICD-10-CM | POA: Diagnosis not present

## 2022-10-28 MED ORDER — FREESTYLE LITE TEST VI STRP
ORAL_STRIP | 11 refills | Status: AC
  Filled 2022-10-28: qty 100, 25d supply, fill #0

## 2022-10-28 MED ORDER — FREESTYLE LANCETS MISC
11 refills | Status: AC
  Filled 2022-10-28: qty 100, 25d supply, fill #0

## 2022-10-28 MED ORDER — BLOOD GLUCOSE MONITOR KIT
PACK | 11 refills | Status: AC
  Filled 2022-10-28: qty 1, 30d supply, fill #0

## 2022-10-28 NOTE — Progress Notes (Unsigned)
Subjective:    Patient ID: Andrea [name], female    DOB: April 30, 1981, 42 y.o.   MRN: [id number]  Chief Complaint  Patient presents with   Transitions Of Care    Former [facility] pt TOC to [place]; pt seen [facility] in [place] a week ago due to rash all over and couldn't get in here; was having some chest tightness and completed EKG; came back abnormal; saw her Cardiologist and was cleared; rash has since cleared up; started Ozempic 1 wk ago, caused severe dizziness, didn't take 2nd shot wanted to discuss first; pt would like new Rx for blood glucose monitor to keep check on blood sugar as her current monitor is out of date    HPI Patient is in today for [facility] appt & discussion about T2DM, HLD, chest pressure, smoking.   Experiencing intermittent chest pain. She has seen her cardiologist; will be having a CT coronary calcium completed. Last night her entire chest felt heavy; pain and pressure for about 10 minutes; she took a baby aspirin and pain eventually relieved. She took Tums as well, but said this did not feel like heartburn pain. Also says she had a very heavy, full meal about an hour prior to symptoms. Pain last night more intense than a few weeks ago. No SOB. No nausea or sweating. States she just didn't feel right, felt "off."   Ozempic is a new medication for her. Very dizzy the next day and slept most of the following day. Wants a new glucometer to check sugar prior to trying next dose.   Working on smoking cessation a little at a time.   Past Medical History:  Diagnosis Date   Abdominal pain    Anxiety    Carpal tunnel syndrome on both sides    Diabetes mellitus without complication (HCC)    type 2    Headache    High cholesterol    Hyperlipidemia    Status post cesarean delivery 12/28/2019    Past Surgical History:  Procedure Laterality Date   CESAREAN SECTION N/A 12/28/2019   Procedure: CESAREAN SECTION;  Surgeon: Sherlyn Hay, DO;  Location: [facility] LD ORS;   Service: Obstetrics;  Laterality: N/ANira Conn, RNFA   CHOLECYSTECTOMY  08/05/2011   KIDNEY STONE SURGERY     NM MYOVIEW LTD  05/07/2013   NORMAL STUDY.  Good exercise capacity with normal BP response.  No EKG changes consistent with ischemia.  Normal LV function (EF 60%) with no R WMA.  No Ischemia or Infarction --> LOW RISK    Family History  Problem Relation Age of Onset   Heart attack Mother 41   Early death Mother 53   Depression Mother    Coronary artery disease Mother 98   Hyperlipidemia Mother    Heart failure Father 65   Depression Father    Healthy Brother    Heart disease Maternal Grandmother    Miscarriages / Stillbirths Paternal Grandmother    Heart attack Paternal Grandfather 35   Early death Paternal Grandfather 58       Died of complications CAD in his 73s   Coronary artery disease Paternal Grandfather 46   Hyperlipidemia Paternal Grandfather    Coronary artery disease Maternal Uncle 42   Heart attack Maternal Uncle 34   Early death Maternal Uncle        Died in his 78G from complications of CAD   Heart attack Paternal Aunt        During a  stress test   Heart attack Paternal Uncle    Early death Paternal Uncle        Died in his 67s    Social History   Tobacco Use   Smoking status: Every Day    Packs/day: 1.00    Years: 19.00    Total pack years: 19.00    Types: Cigarettes   Smokeless tobacco: Never  Vaping Use   Vaping Use: Never used  Substance Use Topics   Alcohol use: Yes    Alcohol/week: 1.0 standard drink of alcohol    Types: 1 Glasses of wine per week    Comment: very rare   Drug use: No     Allergies  Allergen Reactions   Metformin And Related Diarrhea   Wellbutrin [Bupropion] Other (See Comments)    Suicidality    Review of Systems NEGATIVE UNLESS OTHERWISE INDICATED IN HPI      Objective:     BP 112/74 (BP Location: Left Arm)   Pulse 100   Temp (!) 97.3 F (36.3 C) (Temporal)   Ht 5\' 1"  (1.549 m)   Wt 185 lb (83.9  kg)   LMP 10/07/2022 (Exact Date)   SpO2 98%   BMI 34.96 kg/m   Wt Readings from Last 3 Encounters:  10/28/22 185 lb (83.9 kg)  10/23/22 185 lb 3.2 oz (84 kg)  10/16/22 186 lb 12.8 oz (84.7 kg)    BP Readings from Last 3 Encounters:  10/28/22 112/74  10/23/22 114/76  10/16/22 122/68     Physical Exam Vitals and nursing note reviewed.  Constitutional:      Appearance: Normal appearance. She is obese. She is not toxic-appearing.  HENT:     Head: Normocephalic and atraumatic.     Right Ear: Tympanic membrane, ear canal and external ear normal.     Left Ear: Tympanic membrane, ear canal and external ear normal.     Nose: Nose normal.     Mouth/Throat:     Mouth: Mucous membranes are moist.  Eyes:     Extraocular Movements: Extraocular movements intact.     Conjunctiva/sclera: Conjunctivae normal.     Pupils: Pupils are equal, round, and reactive to light.  Cardiovascular:     Rate and Rhythm: Normal rate and regular rhythm.     Pulses: Normal pulses.     Heart sounds: Normal heart sounds.  Pulmonary:     Effort: Pulmonary effort is normal.     Breath sounds: Normal breath sounds.  Abdominal:     General: Abdomen is flat. Bowel sounds are normal.     Palpations: Abdomen is soft.  Musculoskeletal:        General: Normal range of motion.     Cervical back: Normal range of motion and neck supple.  Skin:    General: Skin is warm and dry.  Neurological:     General: No focal deficit present.     Mental Status: She is alert and oriented to person, place, and time.  Psychiatric:        Mood and Affect: Mood normal.        Behavior: Behavior normal.        Thought Content: Thought content normal.        Judgment: Judgment normal.        Assessment & Plan:  Type 2 diabetes mellitus with other specified complication, without long-term current use of insulin (HCC) Assessment & Plan: Lab Results  Component Value Date   HGBA1C 8.4 (  A) 10/16/2022   Glucometer and  supplies were updated, refill sent on these Her first dose of Ozempic did not go well. She plans to try 1 more dose of Ozempic 0.25 mg.  She plans to check her glucose before she takes the medicine and also again if she starts to feel dizzy or fatigued like she did the first time.  We may need to consider switching to [facility] or other class of medication altogether.  She is very motivated to keep working on lifestyle changes.  Orders: -     blood glucose meter kit and supplies; Use up to four times daily as directed.  Dispense: 1 each; Refill: 11  Hyperlipidemia associated with type 2 diabetes mellitus (HCC) Assessment & Plan: Lab Results  Component Value Date   CHOL 217 (H) 03/08/2021   HDL 36 (L) 03/08/2021   LDLCALC 147 (H) 03/08/2021   LDLDIRECT 171.2 12/29/2009   TRIG 207 (H) 03/08/2021   CHOLHDL 6.0 (H) 03/08/2021   She is currently taking atorvastatin 40 mg daily.  She will continue to work on lifestyle changes.  Orders: -     blood glucose meter kit and supplies; Use up to four times daily as directed.  Dispense: 1 each; Refill: 11  Tobacco abuse Assessment & Plan: She is motivated to quit.  Wants to do this naturally and a few cigarettes at a time.  Encouraged her to keep up the good work.   Left chest pressure Assessment & Plan: Notes from cardiologist 10/23/22:  Chest pain. Patient has a remote history of negative stress test from 2014. She has a strong family history of heart disease on both sides of her family with her mother passing away from MI at age 76. Chest pain is described as left sided squeezing occurring every day for the last 1.5 weeks and lasting from an hour to all day. Pain waxes and wanes with no aggravating or alleviating factors. Will get Coronary CT to evaluate further.   From my discussion with patient today in regard to the pain she experienced yesterday, I wonder about GERD / GI etiology.  I certainly agree with workup with cardiology first.  We may  also need to consider PPI or other added measures.  Small, more frequent meals advised.  Strict ER precautions discussed       Return in about 3 months (around 01/27/2023) for med recheck; HA1c recheck .  This note was prepared with assistance of Conservation officer, historic buildings. Occasional wrong-word or sound-a-like substitutions may have occurred due to the inherent limitations of voice recognition software.    Thimothy Barretta M Jens Siems, PA-C

## 2022-10-28 NOTE — Patient Instructions (Signed)
Good to see you today!  Trial Ozempic again after obtaining glucose monitoring system. Keep track of glucose. Let me know how 2nd trial of Ozempic goes. Consider Mounjaro.   Continue follow-up with cardiologist. ED if sudden severe symptoms.   Keep working on weight loss and smoking cessation. You can do it!

## 2022-10-29 DIAGNOSIS — E1169 Type 2 diabetes mellitus with other specified complication: Secondary | ICD-10-CM | POA: Insufficient documentation

## 2022-10-29 NOTE — Assessment & Plan Note (Signed)
Lab Results  Component Value Date   HGBA1C 8.4 (A) 10/16/2022   Glucometer and supplies were updated, refill sent on these Her first dose of Ozempic did not go well. She plans to try 1 more dose of Ozempic 0.25 mg.  She plans to check her glucose before she takes the medicine and also again if she starts to feel dizzy or fatigued like she did the first time.  We may need to consider switching to [facility] or other class of medication altogether.  She is very motivated to keep working on lifestyle changes.

## 2022-10-29 NOTE — Assessment & Plan Note (Signed)
She is motivated to quit.  Wants to do this naturally and a few cigarettes at a time.  Encouraged her to keep up the good work.

## 2022-10-29 NOTE — Assessment & Plan Note (Signed)
Lab Results  Component Value Date   CHOL 217 (H) 03/08/2021   HDL 36 (L) 03/08/2021   LDLCALC 147 (H) 03/08/2021   LDLDIRECT 171.2 12/29/2009   TRIG 207 (H) 03/08/2021   CHOLHDL 6.0 (H) 03/08/2021   She is currently taking atorvastatin 40 mg daily.  She will continue to work on lifestyle changes.

## 2022-10-29 NOTE — Assessment & Plan Note (Signed)
Notes from cardiologist 10/23/22:  Chest pain. Patient has a remote history of negative stress test from 2014. She has a strong family history of heart disease on both sides of her family with her mother passing away from MI at age 44. Chest pain is described as left sided squeezing occurring every day for the last 1.5 weeks and lasting from an hour to all day. Pain waxes and wanes with no aggravating or alleviating factors. Will get Coronary CT to evaluate further.   From my discussion with patient today in regard to the pain she experienced yesterday, I wonder about GERD / GI etiology.  I certainly agree with workup with cardiology first.  We may also need to consider PPI or other added measures.  Small, more frequent meals advised.  Strict ER precautions discussed

## 2022-10-30 ENCOUNTER — Telehealth (HOSPITAL_COMMUNITY): Payer: Self-pay | Admitting: *Deleted

## 2022-10-30 ENCOUNTER — Other Ambulatory Visit: Payer: Self-pay

## 2022-10-30 ENCOUNTER — Other Ambulatory Visit (HOSPITAL_COMMUNITY): Payer: Self-pay | Admitting: *Deleted

## 2022-10-30 ENCOUNTER — Other Ambulatory Visit (HOSPITAL_COMMUNITY): Payer: Self-pay

## 2022-10-30 MED ORDER — IVABRADINE HCL 7.5 MG PO TABS
15.0000 mg | ORAL_TABLET | ORAL | 0 refills | Status: DC
  Filled 2022-10-30: qty 2, 1d supply, fill #0

## 2022-10-30 MED ORDER — METOPROLOL TARTRATE 100 MG PO TABS
ORAL_TABLET | ORAL | 0 refills | Status: DC
  Filled 2022-10-30: qty 1, 1d supply, fill #0

## 2022-10-30 NOTE — Telephone Encounter (Signed)
Patient returning call regarding upcoming cardiac imaging study; pt verbalizes understanding of appt date/time, parking situation and where to check in, pre-test NPO status and medications ordered, and verified current allergies; name and call back number provided for further questions should they arise  Gordy Clement RN Navigator Cardiac Imaging [facility] [phone] office [phone] cell  Patient to take 100mg  metoprolol tartrate and 15mg  ivabradine two hours prior to her cardiac CT scan. She is aware to arrive at 3pm.

## 2022-10-30 NOTE — Telephone Encounter (Signed)
Attempted to call patient regarding upcoming cardiac CT appointment. °Left message on voicemail with name and callback number ° °Kimberlyann Hollar RN Navigator Cardiac Imaging °[facility] °[phone] Office °[phone] Cell ° °

## 2022-10-31 ENCOUNTER — Ambulatory Visit (HOSPITAL_COMMUNITY)
Admission: RE | Admit: 2022-10-31 | Discharge: 2022-10-31 | Disposition: A | Discharge status: Home / Self Care | Payer: 59 | Source: Ambulatory Visit | Attending: Student | Admitting: Student

## 2022-10-31 ENCOUNTER — Other Ambulatory Visit (HOSPITAL_COMMUNITY): Payer: Self-pay

## 2022-10-31 DIAGNOSIS — R072 Precordial pain: Secondary | ICD-10-CM

## 2022-10-31 MED ORDER — NITROGLYCERIN 0.4 MG SL SUBL
SUBLINGUAL_TABLET | SUBLINGUAL | Status: AC
  Filled 2022-10-31: qty 2

## 2022-10-31 MED ORDER — METOPROLOL TARTRATE 5 MG/5ML IV SOLN
INTRAVENOUS | Status: AC
  Administered 2022-10-31: 5 mg via INTRAVENOUS
  Filled 2022-10-31: qty 5

## 2022-10-31 MED ORDER — IOHEXOL 350 MG/ML SOLN
100.0000 mL | Freq: Once | INTRAVENOUS | Status: AC | PRN
  Administered 2022-10-31: 100 mL via INTRAVENOUS

## 2022-10-31 MED ORDER — METOPROLOL TARTRATE 5 MG/5ML IV SOLN: 5.0000 mg | Freq: Once | INTRAVENOUS | Status: AC

## 2022-10-31 MED ORDER — NITROGLYCERIN 0.4 MG SL SUBL
0.8000 mg | SUBLINGUAL_TABLET | SUBLINGUAL | Status: DC | PRN
  Administered 2022-10-31: 0.8 mg via SUBLINGUAL

## 2022-11-12 ENCOUNTER — Other Ambulatory Visit (HOSPITAL_COMMUNITY): Payer: Self-pay

## 2022-11-12 ENCOUNTER — Other Ambulatory Visit: Payer: Self-pay

## 2022-11-19 NOTE — Progress Notes (Unsigned)
Cardiology Clinic Note   Patient Name: Andrea [name] [facility] Date of Encounter: 11/21/2022  Primary Care Provider:  Allwardt, Randa Evens, PA-C Primary Cardiologist:  Glenetta Hew, MD  Patient Profile    Andrea [name] 42 year old female presents to the today for review of her coronary CTA.  Past Medical History    Past Medical History:  Diagnosis Date   Abdominal pain    Anxiety    Carpal tunnel syndrome on both sides    Diabetes mellitus without complication (HCC)    type 2    Headache    High cholesterol    Hyperlipidemia    Status post cesarean delivery 12/28/2019   Past Surgical History:  Procedure Laterality Date   CESAREAN SECTION N/A 12/28/2019   Procedure: CESAREAN SECTION;  Surgeon: Sherlyn Hay, DO;  Location: [facility] LD ORS;  Service: Obstetrics;  Laterality: N/ANira Conn, RNFA   CHOLECYSTECTOMY  08/05/2011   KIDNEY STONE SURGERY     NM MYOVIEW LTD  05/07/2013   NORMAL STUDY.  Good exercise capacity with normal BP response.  No EKG changes consistent with ischemia.  Normal LV function (EF 60%) with no R WMA.  No Ischemia or Infarction --> LOW RISK    Allergies  Allergies  Allergen Reactions   Metformin And Related Diarrhea   Wellbutrin [Bupropion] Other (See Comments)    Suicidality    History of Present Illness    Andrea [name] has a PMH of hyperlipidemia, chest discomfort, tachycardia, OSA, type 2 diabetes, tobacco abuse and abnormal EKG.  She is a patient of Dr. Sallyanne Kuster.  She was evaluated for chest pain 7/14.  She underwent stress testing which was normal.  She was found to have OSA by Dr. Claiborne Billings.  She was seen in follow-up by Dr. Ellyn Hack 7/22.  During that time she reported tachycardia and dizziness.  Her cardiac event monitor did not show arrhythmias.  She presented to the emergency department with chest discomfort and was noted to have an abnormal EKG 10/16/2022.  She underwent lab testing and chest x-ray.  She was in the  waiting room for several hours and decided to leave due to extended wait time.  She followed up with Mayra Reel NP-C on 10/23/2022.  She reported 1-1/2 weeks of squeezing type chest discomfort that would come on at rest.  The pain could last for as little as an hour or up to all day.  A coronary CTA was ordered and showed a coronary calcium score of 36 was placed here in the 98th percentile for sex matched control.  She was noted to hav mild LAD, circumflex and RCA disease, 25-49% stenosis CAD RADS score 2.  She was instructed to do her Lipitor therapy that was started during the office visit.  She presents to the clinic today for follow-up evaluation states she continues to notice intermittent periods of chest discomfort.  Her chest discomfort will come on at rest and last for 5 to 10 minutes and dissipate with rest.  We reviewed her coronary CTA and she expressed understanding.  We reviewed typical ACS type chest discomfort.  We reviewed her history of early coronary disease.  I encouraged her to increase her physical activity, continue atorvastatin, reviewed modifiable risk factors.  We will repeat her fasting lipids and LFTs in 8 weeks and plan follow-up in 9 to 12 months.  Today she denies  shortness of breath, lower extremity edema, fatigue, palpitations, melena, hematuria, hemoptysis, diaphoresis, weakness, presyncope, syncope,  orthopnea, and PND.   Home Medications    Prior to Admission medications   Medication Sig Start Date End Date Taking? Authorizing Provider  acetaminophen (TYLENOL) 500 MG tablet Take 1,000 mg by mouth every 6 (six) hours as needed for mild pain or headache.    [provider]  atorvastatin (LIPITOR) 40 MG tablet Take 1 tablet (40 mg total) by mouth daily. 10/23/22   Mayra Reel, NP  blood glucose meter kit and supplies KIT Use up to four times daily as directed. 10/28/22   Allwardt, Randa Evens, PA-C  FLUoxetine (PROZAC) 20 MG capsule Take 1 capsule  (20 mg total) by mouth daily. 12/31/21   Inda Coke, PA  glucose blood (FREESTYLE LITE) test strip Use as directed to test blood sugar up to four times daily. 10/28/22   Allwardt, Randa Evens, PA-C  ivabradine (CORLANOR) 7.5 MG TABS tablet Take 2 tablets (15mg ) by mouth 2 hours prior to your cardiac CT scan. 10/30/22   Mayra Reel, NP  Lancets (FREESTYLE) lancets Use to test blood sugar up to 4 times daily as directed. 10/28/22   Allwardt, Randa Evens, PA-C  metoprolol tartrate (LOPRESSOR) 100 MG tablet Take 1 tablet (100mg ) by mouth 2 hours prior to your cardiac CT scan. 10/30/22   Mayra Reel, NP  Semaglutide,0.25 or 0.5MG /DOS, (OZEMPIC, 0.25 OR 0.5 MG/DOSE,) 2 MG/3ML SOPN Inject 0.25 mg into the skin once a week. 10/16/22   Wendie Agreste, MD    Family History    Family History  Problem Relation Age of Onset   Heart attack Mother 32   Early death Mother 56   Depression Mother    Coronary artery disease Mother 24   Hyperlipidemia Mother    Heart failure Father 20   Depression Father    Healthy Brother    Heart disease Maternal Grandmother    Miscarriages / Stillbirths Paternal Grandmother    Heart attack Paternal Grandfather 30   Early death Paternal Grandfather 62       Died of complications CAD in his 17s   Coronary artery disease Paternal Grandfather 42   Hyperlipidemia Paternal Grandfather    Coronary artery disease Maternal Uncle 42   Heart attack Maternal Uncle 60   Early death Maternal Uncle        Died in his 27O from complications of CAD   Heart attack Paternal Aunt        During a stress test   Heart attack Paternal Uncle    Early death Paternal Uncle        Died in his 75s   She indicated that her mother is deceased. She indicated that her father is alive. She indicated that her brother is alive. She indicated that her maternal grandmother is deceased. She indicated that her maternal grandfather is deceased. She indicated that her paternal grandmother is  deceased. She indicated that her paternal grandfather is deceased. She indicated that her daughter is alive. She indicated that her son is deceased. She indicated that her maternal uncle is alive. She indicated that her paternal aunt is alive. She indicated that her paternal uncle is deceased.  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   Lives with her husband and both their 3 children.   She is a stay-at-home mom.   Smoker ~ 1 PPD x 30 yr.    Occasional EtOH only   No other drugs.    ~1 can of soda per day       She is admittedly is not very active.  Does not do routine exercise. ->  Walking maybe 15 to 20 minutes at a time 3 to 4 days a week.  Relatively recent change.   Social Determinants of Health   Financial Resource Strain: Not on file  Food Insecurity: Not on file  Transportation Needs: Not on file  Physical Activity: Not on file  Stress: Not on file  Social Connections: Not on file  Intimate Partner Violence: Not on file     Review of Systems    General:  No chills, fever, night sweats or weight changes.   Cardiovascular:  No chest pain, dyspnea on exertion, edema, orthopnea, palpitations, paroxysmal nocturnal dyspnea. Dermatological: No rash, lesions/masses Respiratory: No cough, dyspnea Urologic: No hematuria, dysuria Abdominal:   No nausea, vomiting, diarrhea, bright red blood per rectum, melena, or hematemesis Neurologic:  No visual changes, wkns, changes in mental status. All other systems reviewed and are otherwise negative except as noted above.  Physical Exam    VS:  BP 114/64   Pulse 96   Ht 5\' 1"  (1.549 m)   Wt 184 lb 12.8 oz (83.8 kg)   LMP 10/07/2022 (Exact Date)   SpO2 98%   BMI 34.92 kg/m  , BMI Body mass index is 34.92 kg/m. GEN: Well nourished, well developed, in no acute distress. HEENT: normal. Neck: Supple, no JVD, carotid bruits, or masses. Cardiac: RRR, no murmurs, rubs, or gallops. No clubbing, cyanosis, edema.  Radials/DP/PT 2+ and equal bilaterally.  Respiratory:  Respirations regular and unlabored, clear to auscultation bilaterally. GI: Soft, nontender, nondistended, BS + x 4. MS: no deformity or atrophy. Skin: warm and dry, no rash. Neuro:  Strength and sensation are intact. Psych: Normal affect.  Accessory Clinical Findings    Recent Labs: 03/22/2022: ALT 20 10/16/2022: BUN 13; Creatinine, Ser 0.69; Hemoglobin 13.6; Platelets 292; Potassium 4.3; Sodium 138   Recent Lipid Panel    Component Value Date/Time   CHOL 217 (H) 03/08/2021 0817   TRIG 207 (H) 03/08/2021 0817   HDL 36 (L) 03/08/2021 0817   CHOLHDL 6.0 (H) 03/08/2021 0817   VLDL 20 10/28/2016 0807   LDLCALC 147 (H) 03/08/2021 0817   LDLDIRECT 171.2 12/29/2009 0851         ECG personally reviewed by me today-none today.  Coronary CTA 10/31/2022  HISTORY: Chest pain, nonspecific   EXAM: Cardiac/Coronary  CT   TECHNIQUE: The patient was  scanned on a [employer].   PROTOCOL: A 120 kV prospective scan was triggered in the descending thoracic aorta at 111 HU's. Axial  non-contrast 3 mm slices were carried out through the heart. The data set was analyzed on a dedicated work station and scored using the Agatston method. Gantry rotation speed was 250 msecs and collimation was .6 mm. Beta blockade and 0.8 mg of sl NTG was given. The 3D data set was reconstructed in 5% intervals of the 35-75 % of the R-R cycle. Systolic and diastolic phases were analyzed on a dedicated work station using MPR, MIP and VRT modes. The patient received contrast: 167mL OMNIPAQUE IOHEXOL 350 MG/ML SOLN.   FINDINGS: Image quality: Good   Noise artifact is: Mildly reduced SNR   Coronary calcium score is 36, which places the patient in the 98th percentile for sex matched control, using age 67 for comparison.   Coronary arteries: Normal coronary origins.  Right dominance.   Right Coronary Artery: Minimal mixed atherosclerotic plaque scattered in the proximal, mid and distal RCA, <25% stenosis. Mild atherosclerotic plaque in the mid RCA and in the proximal PDA, 25-49% stenosis.   Left Main Coronary Artery: No detectable plaque or stenosis.   Left Anterior Descending Coronary Artery: Minimal ostial mixed atherosclerotic plaque, <25% stenosis. Mild mixed atherosclerotic plaque in the mid LAD, 25-49% stenosis.   Left Circumflex Artery: Mild atherosclerotic plaque in the proximal OM1, 25-49% stenosis.   Aorta: Normal size, 29 mm at the mid ascending aorta (level of the PA bifurcation) measured double oblique.   Aortic Valve: No calcifications.  Tricuspid aortic valve.   Other findings:   Normal variant pulmonary vein drainage into the left atrium, 3 on the right, 2 on the left.   Normal left atrial appendage without thrombus.   Normal size of the pulmonary artery.   Please see separate report from [facility] for non-cardiac findings.   IMPRESSION: 1. Mild CAD in LAD, LCx and RCA distribution, 25-49% stenosis, CADRADS 2. 2. Coronary calcium score is 36,  which places the patient in the 98th percentile for sex matched control, using age 70 for comparison. 3. Normal coronary origins with right dominance. RECOMMENDATIONS: CAD-RADS 2. Mild non-obstructive CAD (25-49%). Consider non-atherosclerotic causes of chest pain. Consider preventive therapy and risk factor modification.     Electronically Signed   By: Cherlynn Kaiser M.D.   On: 11/02/2022 11:44  Assessment & Plan   1.  Coronary artery disease-no chest pain today.  Underwent coronary CTA which showed nonobstructive coronary disease.  Details above. Continue atorvastatin Heart healthy low-sodium diet Increase physical activity as tolerated  Hyperlipidemia-LDL 147 on 5/22. Continue atorvastatin 40 Heart healthy low-sodium high-fiber diet Repeat fasting lipids and LFTs in 8 weeks.  Tobacco abuse-has been working on reducing amount of smoking per day. Smoking cessation strongly encouraged Smoking cessation instructions given  Disposition: Follow-up with Dr. Ellyn Hack or me in 9-12 months.   Jossie Ng. Ahliyah Nienow NP-C     11/21/2022, 4:20 PM [facility] [address] Office [phone] Fax [phone]    I spent 14 minutes examining this patient, reviewing medications, and using patient centered shared decision making involving her cardiac care.  Prior to her visit I spent greater than 20 minutes reviewing her past medical history,  medications, and prior cardiac tests.

## 2022-11-21 ENCOUNTER — Ambulatory Visit: Payer: 59 | Attending: General Practice | Admitting: General Practice

## 2022-11-21 ENCOUNTER — Encounter: Payer: Self-pay | Admitting: General Practice

## 2022-11-21 VITALS — BP 114/64 | HR 96 | Ht 61.0 in | Wt 184.8 lb

## 2022-11-21 DIAGNOSIS — E785 Hyperlipidemia, unspecified: Secondary | ICD-10-CM

## 2022-11-21 DIAGNOSIS — Z720 Tobacco use: Secondary | ICD-10-CM

## 2022-11-21 DIAGNOSIS — I2510 Atherosclerotic heart disease of native coronary artery without angina pectoris: Secondary | ICD-10-CM | POA: Diagnosis not present

## 2022-11-21 NOTE — Patient Instructions (Addendum)
Medication Instructions:   Your physician recommends that you continue on your current medications as directed. Please refer to the Current Medication list given to you today.  *If you need a refill on your cardiac medications before your next appointment, please call your pharmacy*  Lab Work: NONE ordered at this time of appointment   If you have labs (blood work) drawn today and your tests are completely normal, you will receive your results only by: [facility] (if you have MyChart) OR A paper copy in the mail If you have any lab test that is abnormal or we need to change your treatment, we will call you to review the results.  Testing/Procedures: NONE ordered at this time of appointment   Follow-Up: At [facility], you and your health needs are our priority.  As part of our continuing mission to provide you with exceptional heart care, we have created designated Provider Care Teams.  These Care Teams include your primary Cardiologist (physician) and Advanced Practice Providers (APPs -  Physician Assistants and Nurse Practitioners) who all work together to provide you with the care you need, when you need it.  Your next appointment:   9-12 month(s)  Provider:   Glenetta Hew, MD  or Coletta Memos, FNP        Other Instructions Coletta Memos, NP recommends that you increase your physical activity and to try to at least get in 150 minutes a week or as tolerated.   How to Increase Your Level of Physical Activity  Getting regular physical activity is important for your overall health and well-being. Most people do not get enough exercise. There are easy ways to increase your level of physical activity, even if you have not been very active in the past or if you are just starting out. What are the benefits of physical activity? Physical activity has many short-term and long-term benefits. Being active on a regular basis can improve your physical and mental health as well  as provide other benefits. Physical health benefits Helping you lose weight or maintain a healthy weight. Strengthening your muscles and bones. Reducing your risk of certain long-term (chronic) diseases, including heart disease, cancer, and diabetes. Being able to move around more easily and for longer periods of time without getting tired (increased endurance or stamina). Improving your ability to fight off illness (enhanced immunity). Being able to sleep better. Helping you stay healthy as you get older, including: Helping you stay mobile, or capable of walking and moving around. Preventing accidents, such as falls. Increasing life expectancy. Mental health benefits Boosting your mood and improving your self-esteem. Lowering your chance of having mental health problems, such as depression or anxiety. Helping you feel good about your body. Other benefits Finding new sources of fun and enjoyment. Meeting new people who share a common interest. Before you begin If you have a chronic illness or have not been active for a while, check with your health care provider about how to get started. Ask your health care provider what activities are safe for you. Start out slowly. Walking or doing some simple chair exercises is a good place to start, especially if you have not been active before or for a long time. Set goals that you can work toward. Ask your health care provider how much exercise is best for you. In general, most adults should: Do moderate-intensity exercise for at least 150 minutes each week (30 minutes on most days of the week) or vigorous exercise for at  least 75 minutes each week, or a combination of these. Moderate-intensity exercise can include walking at a quick pace, biking, yoga, water aerobics, or gardening. Vigorous exercise involves activities that take more effort, such as jogging or running, playing sports, swimming laps, or jumping rope. Do strength exercises on at least  2 days each week. This can include weight lifting, body weight exercises, and resistance-band exercises. How to be more physically active Make a plan  Try to find activities that you enjoy. You are more likely to commit to an exercise routine if it does not feel like a chore. If you have bone or joint problems, choose low-impact exercises, like walking or swimming. Use these tips for being successful with an exercise plan: Find a workout partner for accountability. Join a group or class, such as an aerobics class, cycling class, or sports team. Make family time active. Go for a walk, bike, or swim. Include a variety of exercises each week. Consider using a fitness tracker, such as a mobile phone app or a device worn like a watch, that will count the number of steps you take each day. Many people strive to reach 10,000 steps a day. Find ways to be active in your daily routines Besides your formal exercise plans, you can find ways to do physical activity during your daily routines, such as: Walking or biking to work or to the store. Taking the stairs instead of the elevator. Parking farther away from the door at work or at the store. Planning walking meetings. Walking around while you are on the phone. Where to find more information Centers for Disease Control and Prevention: CampusCasting.com.pt President's Council on Fitness, Sports & Nutrition: www.fitness.gov ChooseMyPlate: http://www.harvey.com/ Contact a health care provider if: You have headaches, muscle aches, or joint pain that is concerning. You feel dizzy or light-headed while exercising. You faint. You feel your heart skipping, racing, or fluttering. You have chest pain while exercising. Summary Exercise benefits your mind and body at any age, even if you are just starting out. If you have a chronic illness or have not been active for a while, check with your health care provider before increasing your physical  activity. Choose activities that are safe and enjoyable for you. Ask your health care provider what activities are safe for you. Start slowly. Tell your health care provider if you have problems as you start to increase your activity level. This information is not intended to replace advice given to you by your health care provider. Make sure you discuss any questions you have with your health care provider. Document Revised: 01/26/2021 Document Reviewed: 01/26/2021 Elsevier Patient Education  2023 Elsevier Inc.   High-Fiber Eating Plan  Fiber, also called dietary fiber, is a type of carbohydrate. It is found foods such as fruits, vegetables, whole grains, and beans. A high-fiber diet can have many health benefits. Your health care provider may recommend a high-fiber diet to help: Prevent constipation. Fiber can make your bowel movements more regular. Lower your cholesterol. Relieve the following conditions: Inflammation of veins in the anus (hemorrhoids). Inflammation of specific areas of the digestive tract (uncomplicated diverticulosis). A problem of the large intestine, also called the colon, that sometimes causes pain and diarrhea (irritable bowel syndrome, or IBS). Prevent overeating as part of a weight-loss plan. Prevent heart disease, type 2 diabetes, and certain cancers. What are tips for following this plan? Reading food labels  Check the nutrition facts label on food products for the amount of dietary fiber.  Choose foods that have 5 grams of fiber or more per serving. The goals for recommended daily fiber intake include: Men (age 45 or younger): 34-38 g. Men (over age 45): 28-34 g. Women (age 73 or younger): 25-28 g. Women (over age 55): 22-25 g. Your daily fiber goal is _____________ g. Shopping Choose whole fruits and vegetables instead of processed forms, such as apple juice or applesauce. Choose a wide variety of high-fiber foods such as avocados, lentils, oats, and  kidney beans. Read the nutrition facts label of the foods you choose. Be aware of foods with added fiber. These foods often have high sugar and sodium amounts per serving. Cooking Use whole-grain flour for baking and cooking. Cook with brown rice instead of white rice. Meal planning Start the day with a breakfast that is high in fiber, such as a cereal that contains 5 g of fiber or more per serving. Eat breads and cereals that are made with whole-grain flour instead of refined flour or white flour. Eat brown rice, bulgur wheat, or millet instead of white rice. Use beans in place of meat in soups, salads, and pasta dishes. Be sure that half of the grains you eat each day are whole grains. General information You can get the recommended daily intake of dietary fiber by: Eating a variety of fruits, vegetables, grains, nuts, and beans. Taking a fiber supplement if you are not able to take in enough fiber in your diet. It is better to get fiber through food than from a supplement. Gradually increase how much fiber you consume. If you increase your intake of dietary fiber too quickly, you may have bloating, cramping, or gas. Drink plenty of water to help you digest fiber. Choose high-fiber snacks, such as berries, raw vegetables, nuts, and popcorn. What foods should I eat? Fruits Berries. Pears. Apples. Oranges. Avocado. Prunes and raisins. Dried figs. Vegetables Sweet potatoes. Spinach. Kale. Artichokes. Cabbage. Broccoli. Cauliflower. Green peas. Carrots. Squash. Grains Whole-grain breads. Multigrain cereal. Oats and oatmeal. Brown rice. Barley. Bulgur wheat. [place]. Quinoa. Bran muffins. Popcorn. Rye wafer crackers. Meats and other proteins Navy beans, kidney beans, and pinto beans. Soybeans. Split peas. Lentils. Nuts and seeds. Dairy Fiber-fortified yogurt. Beverages Fiber-fortified soy milk. Fiber-fortified orange juice. Other foods Fiber bars. The items listed above may not be a  complete list of recommended foods and beverages. Contact a dietitian for more information. What foods should I avoid? Fruits Fruit juice. Cooked, strained fruit. Vegetables Fried potatoes. Canned vegetables. Well-cooked vegetables. Grains White bread. Pasta made with refined flour. White rice. Meats and other proteins Fatty cuts of meat. Fried chicken or fried fish. Dairy Milk. Yogurt. Cream cheese. Sour cream. Fats and oils Butters. Beverages Soft drinks. Other foods Cakes and pastries. The items listed above may not be a complete list of foods and beverages to avoid. Talk with your dietitian about what choices are best for you. Summary Fiber is a type of carbohydrate. It is found in foods such as fruits, vegetables, whole grains, and beans. A high-fiber diet has many benefits. It can help to prevent constipation, lower blood cholesterol, aid weight loss, and reduce your risk of heart disease, diabetes, and certain cancers. Increase your intake of fiber gradually. Increasing fiber too quickly may cause cramping, bloating, and gas. Drink plenty of water while you increase the amount of fiber you consume. The best sources of fiber include whole fruits and vegetables, whole grains, nuts, seeds, and beans. This information is not intended to replace advice given  to you by your health care provider. Make sure you discuss any questions you have with your health care provider. Document Revised: 02/03/2020 Document Reviewed: 02/03/2020 Elsevier Patient Education  [place].

## 2022-11-21 NOTE — Progress Notes (Signed)
This encounter was created in error - please disregard.

## 2022-12-03 ENCOUNTER — Other Ambulatory Visit: Payer: Self-pay | Admitting: Physician Assistant

## 2022-12-03 ENCOUNTER — Encounter: Payer: Self-pay | Admitting: Physician Assistant

## 2022-12-03 MED ORDER — OZEMPIC (0.25 OR 0.5 MG/DOSE) 2 MG/3ML ~~LOC~~ SOPN
0.5000 mg | PEN_INJECTOR | SUBCUTANEOUS | 0 refills | Status: DC
  Filled 2022-12-03 – 2022-12-09 (×2): qty 3, 28d supply, fill #0

## 2022-12-04 ENCOUNTER — Other Ambulatory Visit: Payer: Self-pay

## 2022-12-04 ENCOUNTER — Other Ambulatory Visit (HOSPITAL_COMMUNITY): Payer: Self-pay

## 2022-12-05 ENCOUNTER — Other Ambulatory Visit (HOSPITAL_COMMUNITY): Payer: Self-pay

## 2022-12-05 NOTE — Telephone Encounter (Signed)
Pt reported that for the past 2-3 months, she has on and off cp. In the last week, the paine ha gotten stronger, rating 6-7/10 with pain radiating up left shoulder and arm, along with dizziness.Marland Kitchen She was watching an emotional TV show last night and had stabbing pian in her chest arm. She did not want to go to the ED. Has no NTG ordered. Recommended that if she has another episode of pian, to go to the ED. Recommended [facility] on [address], if she does not want to go back to [facility]. Appointment made with Arnold Long for 3/1.

## 2022-12-09 ENCOUNTER — Telehealth: Payer: Self-pay

## 2022-12-09 ENCOUNTER — Other Ambulatory Visit (HOSPITAL_COMMUNITY): Payer: Self-pay

## 2022-12-09 ENCOUNTER — Other Ambulatory Visit: Payer: Self-pay

## 2022-12-09 NOTE — Telephone Encounter (Signed)
PA needed for patient Ozempic 0.5   Submitted via Cover My Meds  Mckenleigh Nevins (KeyNoreene Filbert) Rx #[id number] Ozempic (0.25 or 0.5 MG/DOSE) '2MG'$ /3ML pen-injectors  The request has been approved. The authorization is effective from 12/09/2022 to 12/10/2023

## 2022-12-12 NOTE — Progress Notes (Deleted)
Cardiology Clinic Note   Patient Name: Andrea [name] [facility] Date of Encounter: 12/12/2022  Primary Care Provider:  Allwardt, Randa Evens, PA-C Primary Cardiologist:  Glenetta Hew, MD  Patient Profile    42 year old female hyperlipidemia, chest discomfort, tachycardia, OSA, type 2 diabetes, tobacco abuse and abnormal EKG. A coronary CTA was ordered and showed a coronary calcium score of 36 was placed here in the 98th percentile for sex matched control.  She was noted to hav mild LAD, circumflex and RCA disease, 25-49% stenosis CAD RADS score 2, ongoing tobacco abuse, and HL.   Past Medical History    Past Medical History:  Diagnosis Date   Abdominal pain    Anxiety    Carpal tunnel syndrome on both sides    Diabetes mellitus without complication (HCC)    type 2    Headache    High cholesterol    Hyperlipidemia    Status post cesarean delivery 12/28/2019   Past Surgical History:  Procedure Laterality Date   CESAREAN SECTION N/A 12/28/2019   Procedure: CESAREAN SECTION;  Surgeon: Sherlyn Hay, DO;  Location: [facility] LD ORS;  Service: Obstetrics;  Laterality: N/ANira Conn, RNFA   CHOLECYSTECTOMY  08/05/2011   KIDNEY STONE SURGERY     NM MYOVIEW LTD  05/07/2013   NORMAL STUDY.  Good exercise capacity with normal BP response.  No EKG changes consistent with ischemia.  Normal LV function (EF 60%) with no R WMA.  No Ischemia or Infarction --> LOW RISK    Allergies  Allergies  Allergen Reactions   Metformin And Related Diarrhea   Wellbutrin [Bupropion] Other (See Comments)    Suicidality    History of Present Illness    Mrs. Lacko returns today for ongoing complaints of chest pain, with hx of HL, Type II diabetes and ongoing tobacco abuse. She has had several cardiac evaluations with most recent Coronary CTA revealing coronary calcium score of 36 was placed here in the 98th percentile for sex matched control.  She was noted to hav mild LAD, circumflex and RCA disease,  25-49% stenosis CAD RADS score 2.   Home Medications    Current Outpatient Medications  Medication Sig Dispense Refill   acetaminophen (TYLENOL) 500 MG tablet Take 1,000 mg by mouth every 6 (six) hours as needed for mild pain or headache.     atorvastatin (LIPITOR) 40 MG tablet Take 1 tablet (40 mg total) by mouth daily. 30 tablet 6   blood glucose meter kit and supplies KIT Use up to four times daily as directed. 1 each 11   glucose blood (FREESTYLE LITE) test strip Use as directed to test blood sugar up to four times daily. 100 each 11   Lancets (FREESTYLE) lancets Use to test blood sugar up to 4 times daily as directed. 100 each 11   Semaglutide,0.25 or 0.'5MG'$ /DOS, (OZEMPIC, 0.25 OR 0.5 MG/DOSE,) 2 MG/3ML SOPN Inject 0.5 mg into the skin once a week. 3 mL 0   Semaglutide,0.25 or 0.'5MG'$ /DOS, (OZEMPIC, 0.25 OR 0.5 MG/DOSE,) 2 MG/3ML SOPN Inject 0.25 mg into the skin once a week. 3 mL 1   No current facility-administered medications for this visit.     Family History    Family History  Problem Relation Age of Onset   Heart attack Mother 81   Early death Mother 40   Depression Mother    Coronary artery disease Mother 21   Hyperlipidemia Mother    Heart failure Father 77   Depression  Father    Healthy Brother    Heart disease Maternal Grandmother    Miscarriages / Stillbirths Paternal Grandmother    Heart attack Paternal Grandfather 3   Early death Paternal Grandfather 58       Died of complications CAD in his 61s   Coronary artery disease Paternal Grandfather 38   Hyperlipidemia Paternal Grandfather    Coronary artery disease Maternal Uncle 42   Heart attack Maternal Uncle 7   Early death Maternal Uncle        Died in his [id number] from complications of CAD   Heart attack Paternal Aunt        During a stress test   Heart attack Paternal Uncle    Early death Paternal Uncle        Died in his 69s   She indicated that her mother is deceased. She indicated that her father is alive.  She indicated that her brother is alive. She indicated that her maternal grandmother is deceased. She indicated that her maternal grandfather is deceased. She indicated that her paternal grandmother is deceased. She indicated that her paternal grandfather is deceased. She indicated that her daughter is alive. She indicated that her son is deceased. She indicated that her maternal uncle is alive. She indicated that her paternal aunt is alive. She indicated that her paternal uncle is deceased.  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   Lives with her husband and both their 3 children.   She is a stay-at-home mom.   Smoker ~ 1 PPD x 30 yr.    Occasional EtOH only   No other drugs.    ~1 can of soda per day       She is admittedly is not very active.  Does not do routine exercise. ->  Walking maybe 15 to 20 minutes at a time 3 to 4 days a week.  Relatively recent change.   Social Determinants of Health   Financial Resource Strain: Not on file  Food Insecurity: Not on file  Transportation Needs: Not on file  Physical Activity: Not on file   Stress: Not on file  Social Connections: Not on file  Intimate Partner Violence: Not on file     Review of Systems    General:  No chills, fever, night sweats or weight changes.  Cardiovascular:  No chest pain, dyspnea on exertion, edema, orthopnea, palpitations, paroxysmal nocturnal dyspnea. Dermatological:  No rash, lesions/masses Respiratory: No cough, dyspnea Urologic: No hematuria, dysuria Abdominal:   No nausea, vomiting, diarrhea, bright red blood per rectum, melena, or hematemesis Neurologic:  No visual changes, wkns, changes in mental status. All other systems reviewed and are otherwise negative except as noted above.     Physical Exam    VS:  There were no vitals taken for this visit. , BMI There is no height or weight on file to calculate BMI.     GEN: Well nourished, well developed, in no acute distress. HEENT: normal. Neck: Supple, no JVD, carotid bruits, or masses. Cardiac: RRR, no murmurs, rubs, or gallops. No clubbing, cyanosis, edema.  Radials/DP/PT 2+ and equal bilaterally.  Respiratory:  Respirations regular and unlabored, clear to auscultation bilaterally. GI: Soft, nontender, nondistended, BS + x 4. MS: no deformity or atrophy. Skin: warm and dry, no rash. Neuro:  Strength and sensation are intact. Psych: Normal affect.  Accessory Clinical Findings    ECG personally reviewed by me today- *** - No acute changes  Lab Results  Component Value Date   WBC 7.9 10/16/2022   HGB 13.6 10/16/2022   HCT 41.4 10/16/2022   MCV 87.0 10/16/2022   PLT 292 10/16/2022   Lab Results  Component Value Date   CREATININE 0.69 10/16/2022   BUN 13 10/16/2022   NA 138 10/16/2022   K 4.3 10/16/2022   CL 100 10/16/2022   CO2 25 10/16/2022   Lab Results  Component Value Date   ALT 20 03/22/2022   AST 14 03/22/2022   ALKPHOS 91 03/22/2022   BILITOT 0.3 03/22/2022   Lab Results  Component Value Date   CHOL 217 (H) 03/08/2021   HDL 36 (L) 03/08/2021   LDLCALC  147 (H) 03/08/2021   LDLDIRECT 171.2 12/29/2009   TRIG 207 (H) 03/08/2021   CHOLHDL 6.0 (H) 03/08/2021    Lab Results  Component Value Date   HGBA1C 8.4 (A) 10/16/2022    Review of Prior Studies: Coronary CTA 11/21/2022 FINDINGS: Image quality: Good   Noise artifact is: Mildly reduced SNR   Coronary calcium score is 36, which places the patient in the 98th percentile for sex matched control, using age 58 for comparison.   Coronary arteries: Normal coronary origins.  Right dominance.   Right Coronary Artery: Minimal mixed atherosclerotic plaque scattered in the proximal, mid and distal RCA, <25% stenosis. Mild atherosclerotic plaque in the mid RCA and in the proximal PDA, 25-49% stenosis.   Left Main Coronary Artery: No detectable plaque or stenosis.   Left Anterior Descending Coronary Artery: Minimal ostial mixed atherosclerotic plaque, <25% stenosis. Mild mixed atherosclerotic plaque in the mid LAD, 25-49% stenosis.   Left Circumflex Artery: Mild atherosclerotic plaque in the proximal OM1, 25-49% stenosis.   Aorta: Normal size, 29 mm at the mid ascending aorta (level of the PA bifurcation) measured double oblique.   Aortic Valve: No calcifications.  Tricuspid aortic valve.   Other findings:   Normal variant pulmonary vein drainage into the left atrium, 3 on the right, 2 on the left.   Normal left atrial appendage without thrombus.   Normal size of the pulmonary artery.  Assessment & Plan   1.  ***    Current medicines are reviewed at length with the patient today.  I have spent *** min's  dedicated to the care of this patient on the date of this encounter to include pre-visit review of records, assessment, management and diagnostic testing,with shared decision making. Signed, Curt Bears  Brooks Sailors DNP, ANP, AACC   12/12/2022 9:25 AM      Office [phone] Fax [phone]  Notice: This dictation was prepared with Dragon dictation along with smaller  phrase technology. Any transcriptional errors that result from this process are unintentional and may not be corrected upon review.

## 2022-12-13 ENCOUNTER — Other Ambulatory Visit: Payer: Self-pay

## 2022-12-13 ENCOUNTER — Ambulatory Visit: Payer: 59 | Admitting: Adult Health

## 2022-12-13 ENCOUNTER — Encounter: Payer: Self-pay | Admitting: Physician Assistant

## 2022-12-13 ENCOUNTER — Other Ambulatory Visit (HOSPITAL_COMMUNITY): Payer: Self-pay

## 2022-12-13 MED ORDER — OZEMPIC (0.25 OR 0.5 MG/DOSE) 2 MG/3ML ~~LOC~~ SOPN
0.5000 mg | PEN_INJECTOR | SUBCUTANEOUS | 2 refills | Status: DC
  Filled 2022-12-13 – 2022-12-31 (×2): qty 3, 28d supply, fill #0

## 2022-12-13 NOTE — Telephone Encounter (Signed)
Sent Rx with refills to pharmacy

## 2022-12-13 NOTE — Telephone Encounter (Signed)
Please see pt msg and advise 

## 2022-12-14 ENCOUNTER — Other Ambulatory Visit (HOSPITAL_COMMUNITY): Payer: Self-pay

## 2023-01-01 ENCOUNTER — Other Ambulatory Visit (HOSPITAL_COMMUNITY): Payer: Self-pay

## 2023-01-01 ENCOUNTER — Other Ambulatory Visit: Payer: Self-pay

## 2023-01-23 ENCOUNTER — Encounter: Payer: Self-pay | Admitting: Physician Assistant

## 2023-01-23 ENCOUNTER — Other Ambulatory Visit (HOSPITAL_COMMUNITY): Payer: Self-pay

## 2023-01-24 ENCOUNTER — Other Ambulatory Visit: Payer: Self-pay

## 2023-01-24 DIAGNOSIS — E1169 Type 2 diabetes mellitus with other specified complication: Secondary | ICD-10-CM

## 2023-01-24 MED ORDER — TIRZEPATIDE 2.5 MG/0.5ML ~~LOC~~ SOAJ
2.5000 mg | SUBCUTANEOUS | 0 refills | Status: DC
  Filled 2023-01-24: qty 2, 28d supply, fill #0

## 2023-01-24 NOTE — Telephone Encounter (Signed)
Please see pt response.

## 2023-01-24 NOTE — Telephone Encounter (Signed)
Please see pt msg and advise 

## 2023-01-25 ENCOUNTER — Other Ambulatory Visit (HOSPITAL_COMMUNITY): Payer: Self-pay

## 2023-01-27 ENCOUNTER — Other Ambulatory Visit (HOSPITAL_COMMUNITY): Payer: Self-pay

## 2023-01-27 ENCOUNTER — Other Ambulatory Visit: Payer: Self-pay

## 2023-02-14 ENCOUNTER — Other Ambulatory Visit (HOSPITAL_COMMUNITY): Payer: Self-pay

## 2023-02-14 DIAGNOSIS — L293 Anogenital pruritus, unspecified: Secondary | ICD-10-CM | POA: Diagnosis not present

## 2023-02-14 MED ORDER — TRIAMCINOLONE ACETONIDE 0.1 % EX OINT
TOPICAL_OINTMENT | CUTANEOUS | 0 refills | Status: DC
  Filled 2023-02-14 – 2023-02-15 (×2): qty 30, 30d supply, fill #0

## 2023-02-15 ENCOUNTER — Other Ambulatory Visit (HOSPITAL_COMMUNITY): Payer: Self-pay

## 2023-02-26 ENCOUNTER — Other Ambulatory Visit: Payer: Self-pay

## 2023-02-26 ENCOUNTER — Other Ambulatory Visit: Payer: Self-pay | Admitting: Physician Assistant

## 2023-02-26 DIAGNOSIS — E1169 Type 2 diabetes mellitus with other specified complication: Secondary | ICD-10-CM

## 2023-02-26 MED ORDER — MOUNJARO 2.5 MG/0.5ML ~~LOC~~ SOAJ
2.5000 mg | SUBCUTANEOUS | 0 refills | Status: DC
  Filled 2023-02-26: qty 2, 28d supply, fill #0

## 2023-03-12 ENCOUNTER — Other Ambulatory Visit (HOSPITAL_COMMUNITY): Payer: Self-pay

## 2023-03-14 DIAGNOSIS — Z113 Encounter for screening for infections with a predominantly sexual mode of transmission: Secondary | ICD-10-CM | POA: Diagnosis not present

## 2023-03-14 DIAGNOSIS — Z124 Encounter for screening for malignant neoplasm of cervix: Secondary | ICD-10-CM | POA: Diagnosis not present

## 2023-03-14 DIAGNOSIS — Z1231 Encounter for screening mammogram for malignant neoplasm of breast: Secondary | ICD-10-CM | POA: Diagnosis not present

## 2023-03-14 DIAGNOSIS — Z130 Encounter for screening for diseases of the blood and blood-forming organs and certain disorders involving the immune mechanism: Secondary | ICD-10-CM | POA: Diagnosis not present

## 2023-03-14 DIAGNOSIS — Z1389 Encounter for screening for other disorder: Secondary | ICD-10-CM | POA: Diagnosis not present

## 2023-03-14 DIAGNOSIS — Z780 Asymptomatic menopausal state: Secondary | ICD-10-CM | POA: Diagnosis not present

## 2023-03-14 DIAGNOSIS — Z1151 Encounter for screening for human papillomavirus (HPV): Secondary | ICD-10-CM | POA: Diagnosis not present

## 2023-03-14 DIAGNOSIS — Z01419 Encounter for gynecological examination (general) (routine) without abnormal findings: Secondary | ICD-10-CM | POA: Diagnosis not present

## 2023-03-14 LAB — HM MAMMOGRAPHY

## 2023-03-17 ENCOUNTER — Other Ambulatory Visit (HOSPITAL_COMMUNITY): Payer: Self-pay

## 2023-03-21 ENCOUNTER — Encounter: Payer: Self-pay | Admitting: Physician Assistant

## 2023-03-21 NOTE — Telephone Encounter (Signed)
Please advise if ok for patient to increase dose

## 2023-03-27 LAB — HM PAP SMEAR: HPV, high-risk: NEGATIVE

## 2023-03-29 ENCOUNTER — Encounter: Payer: Self-pay | Admitting: Physician Assistant

## 2023-03-29 ENCOUNTER — Other Ambulatory Visit (HOSPITAL_COMMUNITY): Payer: Self-pay

## 2023-03-29 ENCOUNTER — Other Ambulatory Visit: Payer: Self-pay | Admitting: Physician Assistant

## 2023-03-29 DIAGNOSIS — E1169 Type 2 diabetes mellitus with other specified complication: Secondary | ICD-10-CM

## 2023-03-31 ENCOUNTER — Encounter: Payer: Self-pay | Admitting: Physician Assistant

## 2023-03-31 ENCOUNTER — Other Ambulatory Visit (HOSPITAL_COMMUNITY): Payer: Self-pay

## 2023-03-31 ENCOUNTER — Other Ambulatory Visit: Payer: Self-pay

## 2023-03-31 MED ORDER — MOUNJARO 2.5 MG/0.5ML ~~LOC~~ SOAJ
2.5000 mg | SUBCUTANEOUS | 0 refills | Status: DC
  Filled 2023-03-31 – 2023-04-01 (×2): qty 2, 28d supply, fill #0

## 2023-04-01 ENCOUNTER — Telehealth: Payer: Self-pay

## 2023-04-01 ENCOUNTER — Other Ambulatory Visit (HOSPITAL_COMMUNITY): Payer: Self-pay

## 2023-04-01 NOTE — Telephone Encounter (Signed)
Andrea [name] (KeyHeide Guile) PA Case ID #: [id number] Rx #: [id number]  This request has been approved using information available on the patient's profile.   Case NU:[id number]; Status:Approved; Review Type:Prior Auth; Coverage Start Date:03/02/2023; Coverage End Date:03/31/2024; Drug Mounjaro 2.5MG /0.5ML pen-injectors

## 2023-04-18 ENCOUNTER — Other Ambulatory Visit: Payer: Self-pay

## 2023-04-19 ENCOUNTER — Encounter (HOSPITAL_BASED_OUTPATIENT_CLINIC_OR_DEPARTMENT_OTHER): Payer: Self-pay

## 2023-04-19 ENCOUNTER — Emergency Department (HOSPITAL_BASED_OUTPATIENT_CLINIC_OR_DEPARTMENT_OTHER)
Admission: EM | Admit: 2023-04-19 | Discharge: 2023-04-19 | Disposition: A | Discharge status: Home / Self Care | Payer: Managed Care, Other (non HMO) | Attending: Emergency Medicine | Admitting: Emergency Medicine

## 2023-04-19 ENCOUNTER — Other Ambulatory Visit: Payer: Self-pay

## 2023-04-19 DIAGNOSIS — Y929 Unspecified place or not applicable: Secondary | ICD-10-CM | POA: Insufficient documentation

## 2023-04-19 DIAGNOSIS — T550X1A Toxic effect of soaps, accidental (unintentional), initial encounter: Secondary | ICD-10-CM | POA: Insufficient documentation

## 2023-04-19 DIAGNOSIS — H5712 Ocular pain, left eye: Secondary | ICD-10-CM | POA: Diagnosis present

## 2023-04-19 DIAGNOSIS — X58XXXA Exposure to other specified factors, initial encounter: Secondary | ICD-10-CM | POA: Insufficient documentation

## 2023-04-19 DIAGNOSIS — T2692XA Corrosion of left eye and adnexa, part unspecified, initial encounter: Secondary | ICD-10-CM

## 2023-04-19 DIAGNOSIS — T2662XA Corrosion of cornea and conjunctival sac, left eye, initial encounter: Secondary | ICD-10-CM | POA: Insufficient documentation

## 2023-04-19 MED ORDER — OXYCODONE-ACETAMINOPHEN 5-325 MG PO TABS
1.0000 | ORAL_TABLET | Freq: Once | ORAL | Status: AC
  Administered 2023-04-19: 1 via ORAL
  Filled 2023-04-19: qty 1

## 2023-04-19 MED ORDER — PROPARACAINE HCL 0.5 % OP SOLN
1.0000 [drp] | Freq: Once | OPHTHALMIC | Status: AC
  Administered 2023-04-19: 1 [drp] via OPHTHALMIC
  Filled 2023-04-19: qty 15

## 2023-04-19 MED ORDER — TETRACAINE HCL 0.5 % OP SOLN: 2.0000 [drp] | Freq: Once | OPHTHALMIC | Status: DC

## 2023-04-19 MED ORDER — FLUORESCEIN SODIUM 1 MG OP STRP
1.0000 | ORAL_STRIP | Freq: Once | OPHTHALMIC | Status: AC
  Administered 2023-04-19: 1 via OPHTHALMIC
  Filled 2023-04-19: qty 1

## 2023-04-19 NOTE — ED Triage Notes (Signed)
Pt accidentally squirted detergent into her left eye today with a strong stream. Redness noted to left eye sclera. Pt states blurry vision

## 2023-04-19 NOTE — ED Notes (Signed)
Pt able to flush her eye with 250 cc normal saline. States it feels a little better,but still hurts when the numbing medication wears off

## 2023-04-19 NOTE — ED Provider Notes (Signed)
[address] EMERGENCY DEPARTMENT AT [facility] Provider Note   CSN: [id number] Arrival date & time: 04/19/23  1357     History  Chief Complaint  Patient presents with   Eye Pain    Andrea [name] is a 42 y.o. female.  Patient presents to the emergency department today for evaluation of left eye pain.  Approximate 1 hour prior to arrival patient accidentally shot dish soap (off brand from a grocery store) into her left eye.  She had pain and discomfort.  She states that her vision was cloudy.  She presented to the emergency department after irrigating the eye at home with some water for a few minutes.  She denies history of corrective lens use, previous eye surgeries.  Denies other trauma.       Home Medications Prior to Admission medications   Medication Sig Start Date End Date Taking? Authorizing Provider  atorvastatin (LIPITOR) 40 MG tablet Take 1 tablet (40 mg total) by mouth daily. 10/23/22   Carlos Levering, NP  blood glucose meter kit and supplies KIT Use up to four times daily as directed. 10/28/22   Allwardt, Alyssa M, PA-C  glucose blood (FREESTYLE LITE) test strip Use as directed to test blood sugar up to four times daily. 10/28/22   Allwardt, Crist Infante, PA-C  Lancets (FREESTYLE) lancets Use to test blood sugar up to 4 times daily as directed. 10/28/22   Allwardt, Crist Infante, PA-C  tirzepatide [facility]) 2.5 MG/0.5ML Pen Inject 2.5 mg into the skin once a week. 03/31/23   Allwardt, Crist Infante, PA-C  triamcinolone ointment (KENALOG) 0.1 % Apply a thin layer to the affected areas topically twice daily 02/14/23         Allergies    Metformin and related and Wellbutrin [bupropion]    Review of Systems   Review of Systems  Physical Exam Updated Vital Signs BP (!) 142/87   Pulse (!) 105   Temp 98.7 F (37.1 C) (Oral)   Resp 16   Ht 5\' 1"  (1.549 m)   Wt 83.9 kg   LMP 04/19/2023   SpO2 99%   BMI 34.96 kg/m   Physical Exam Vitals and nursing note  reviewed.  Constitutional:      Appearance: She is well-developed.  HENT:     Head: Normocephalic and atraumatic.  Eyes:     General: Lids are normal.        Right eye: No foreign body or discharge.        Left eye: No foreign body or discharge.     Extraocular Movements: Extraocular movements intact.     Conjunctiva/sclera:     Right eye: Right conjunctiva is not injected. No chemosis, exudate or hemorrhage.    Left eye: Left conjunctiva is injected. No chemosis, exudate or hemorrhage.    Pupils: Pupils are equal, round, and reactive to light.     Right eye: Pupil is round, reactive and not sluggish.     Left eye: Pupil is round, reactive and not sluggish. No corneal abrasion or fluorescein uptake.     Slit lamp exam:    Left eye: No corneal ulcer.     Comments: Cornea appears clear.  Pain was resolved shortly after administration of proparacaine drop. pH was 7-8 on pH paper.   Pulmonary:     Effort: No respiratory distress.  Musculoskeletal:     Cervical back: Normal range of motion and neck supple.  Skin:    General: Skin is warm and  dry.  Neurological:     Mental Status: She is alert.     ED Results / Procedures / Treatments   Labs (all labs ordered are listed, but only abnormal results are displayed) Labs Reviewed - No data to display  EKG None  Radiology No results found.  Procedures Procedures    Medications Ordered in ED Medications  fluorescein ophthalmic strip 1 strip (1 strip Right Eye Given 04/19/23 1529)  oxyCODONE-acetaminophen (PERCOCET/ROXICET) 5-325 MG per tablet 1 tablet (1 tablet Oral Given 04/19/23 1556)  proparacaine (ALCAINE) 0.5 % ophthalmic solution 1 drop (1 drop Left Eye Given by Other 04/19/23 1529)    ED Course/ Medical Decision Making/ A&P    Patient seen and examined. History obtained directly from patient.   Labs/EKG: None ordered  Imaging: None ordered  Medications/Fluids: Ordered: proparacaine, fluorescein.   Most recent vital  signs reviewed and are as follows: BP (!) 142/87   Pulse (!) 105   Temp 98.7 F (37.1 C) (Oral)   Resp 16   Ht 5\' 1"  (1.549 m)   Wt 83.9 kg   LMP 04/19/2023   SpO2 99%   BMI 34.96 kg/m   Initial impression: Left eye irritation after detergent exposure  4:21 PM Reassessment performed. Patient appears improved.  Eye exam is unchanged.  Recheck pH, still 7.  Reviewed pertinent lab work and imaging with patient at bedside. Questions answered.   Most current vital signs reviewed and are as follows: BP (!) 142/87   Pulse (!) 105   Temp 98.7 F (37.1 C) (Oral)   Resp 16   Ht 5\' 1"  (1.549 m)   Wt 83.9 kg   LMP 04/19/2023   SpO2 99%   BMI 34.96 kg/m   Plan: Discharge to home.   Prescriptions written for: None  Other home care instructions discussed: Cool compress, dark room  ED return instructions discussed: Uncontrolled pain, worsening vision changes, new symptoms or other concerns  Follow-up instructions discussed: Patient encouraged to follow-up with ophthalmology in 2 days if symptoms are persistent, not improving or worsening.                             Medical Decision Making Risk Prescription drug management.   Patient with chemical burn to the left eye from dish detergent.  Suspect that the pH was around 9.  Patient irrigated at home.  Upon arrival here, pH 7-8, additional irrigation performed and pH remained around 7.  Patient much improved with topical anesthetic.  No sign of corneal ulceration or cloudiness.  Patient will follow-up with ophthalmology as needed.  Referral given.  Do not feel that patient requires emergent ophthalmologic evaluation today.        Final Clinical Impression(s) / ED Diagnoses Final diagnoses:  Chemical burn of left eye    Rx / DC Orders ED Discharge Orders     None         Renne Crigler, PA-C 04/19/23 1623    Curatolo, Adam, DO 04/19/23 1818

## 2023-04-19 NOTE — Discharge Instructions (Addendum)
Please read and follow all provided instructions.  Your diagnoses today include:  1. Chemical burn of left eye    Tests performed today include: Visual acuity testing to check your vision Fluorescein dye examination to look for scratches on your eye pH check was normal Vital signs. See below for your results today.   Medications prescribed:  Please use over-the-counter NSAID medications (ibuprofen, naproxen) or Tylenol (acetaminophen) as directed on the packaging for pain -- as long as you do not have any reasons avoid these medications. Reasons to avoid NSAID medications include: weak kidneys, a history of bleeding in your stomach or gut, or uncontrolled high blood pressure or previous heart attack. Reasons to avoid Tylenol include: liver problems or ongoing alcohol use. Never take more than 4000mg  or 8 Extra strength Tylenol in a 24 hour period.     Take any prescribed medications only as directed.  Home care instructions:  Follow any educational materials contained in this packet. If you wear contact lenses, do not use them until your eye caregiver approves. Follow-up care is necessary to be sure the infection is healing if not completely resolved in 2-3 days. See your caregiver or eye specialist as suggested for followup.   If you have an eye infection, wash your hands often as this is very contagious and is easily spread from person to person.   Follow-up instructions: Please follow-up with the opthalmologist listed in the next 2-3 days for further evaluation of your symptoms if not improving.   Return instructions:  Please return to the Emergency Department if you experience worsening symptoms.  Please return immediately if you develop severe pain, pus drainage, new change in vision, or fever. Please return if you have any other emergent concerns.  Additional Information:  Your vital signs today were: BP (!) 142/87   Pulse (!) 105   Temp 98.7 F (37.1 C) (Oral)   Resp 16    Ht 5\' 1"  (1.549 m)   Wt 83.9 kg   LMP 04/19/2023   SpO2 99%   BMI 34.96 kg/m  If your blood pressure (BP) was elevated above 135/85 this visit, please have this repeated by your doctor within one month. ---------------

## 2023-04-19 NOTE — ED Notes (Signed)
Pt rinsed out eye at home.

## 2023-04-19 NOTE — ED Notes (Signed)
Pt unable to tolerate eye wash station. RN flushed with 100cc normal saline, PA at bedside now

## 2023-04-25 ENCOUNTER — Other Ambulatory Visit (HOSPITAL_COMMUNITY): Payer: Self-pay

## 2023-04-25 ENCOUNTER — Other Ambulatory Visit: Payer: Self-pay | Admitting: Physician Assistant

## 2023-04-25 DIAGNOSIS — E1169 Type 2 diabetes mellitus with other specified complication: Secondary | ICD-10-CM

## 2023-04-25 MED ORDER — MOUNJARO 2.5 MG/0.5ML ~~LOC~~ SOAJ
2.5000 mg | SUBCUTANEOUS | 0 refills | Status: DC
Start: 2023-04-25 — End: 2023-05-28
  Filled 2023-04-25: qty 2, 28d supply, fill #0

## 2023-04-26 ENCOUNTER — Other Ambulatory Visit (HOSPITAL_COMMUNITY): Payer: Self-pay

## 2023-05-23 ENCOUNTER — Encounter: Payer: Self-pay | Admitting: Physician Assistant

## 2023-05-27 ENCOUNTER — Other Ambulatory Visit (HOSPITAL_COMMUNITY): Payer: Self-pay

## 2023-05-27 ENCOUNTER — Other Ambulatory Visit: Payer: Self-pay | Admitting: Physician Assistant

## 2023-05-27 DIAGNOSIS — E1169 Type 2 diabetes mellitus with other specified complication: Secondary | ICD-10-CM

## 2023-05-27 NOTE — Telephone Encounter (Signed)
Patient called back and was scheduled virtually for 05/29/23 @ 11am. Patient is asking for medication to be sent in since she doesn't want to go that long without it.

## 2023-05-27 NOTE — Telephone Encounter (Signed)
LVM for pt to back to make Virtual or in-person appointment for medication refills.

## 2023-05-28 ENCOUNTER — Other Ambulatory Visit: Payer: Self-pay

## 2023-05-28 ENCOUNTER — Other Ambulatory Visit (HOSPITAL_COMMUNITY): Payer: Self-pay

## 2023-05-28 MED ORDER — TIRZEPATIDE 5 MG/0.5ML ~~LOC~~ SOAJ
5.0000 mg | SUBCUTANEOUS | 0 refills | Status: DC
  Filled 2023-05-28 – 2023-05-29 (×2): qty 2, 28d supply, fill #0

## 2023-05-28 NOTE — Telephone Encounter (Signed)
Please advise 

## 2023-05-29 ENCOUNTER — Telehealth: Payer: Managed Care, Other (non HMO) | Admitting: Physician Assistant

## 2023-05-29 ENCOUNTER — Encounter: Payer: Self-pay | Admitting: Physician Assistant

## 2023-05-29 ENCOUNTER — Other Ambulatory Visit (HOSPITAL_COMMUNITY): Payer: Self-pay

## 2023-05-29 ENCOUNTER — Ambulatory Visit: Payer: Managed Care, Other (non HMO) | Admitting: Physician Assistant

## 2023-05-29 ENCOUNTER — Other Ambulatory Visit: Payer: Self-pay

## 2023-05-29 VITALS — BP 118/80 | HR 96 | Temp 98.2°F | Ht 61.0 in | Wt 182.4 lb

## 2023-05-29 DIAGNOSIS — E785 Hyperlipidemia, unspecified: Secondary | ICD-10-CM

## 2023-05-29 DIAGNOSIS — F32A Depression, unspecified: Secondary | ICD-10-CM

## 2023-05-29 DIAGNOSIS — F419 Anxiety disorder, unspecified: Secondary | ICD-10-CM | POA: Diagnosis not present

## 2023-05-29 DIAGNOSIS — B372 Candidiasis of skin and nail: Secondary | ICD-10-CM

## 2023-05-29 DIAGNOSIS — E1169 Type 2 diabetes mellitus with other specified complication: Secondary | ICD-10-CM

## 2023-05-29 DIAGNOSIS — Z7985 Long-term (current) use of injectable non-insulin antidiabetic drugs: Secondary | ICD-10-CM | POA: Diagnosis not present

## 2023-05-29 LAB — COMPREHENSIVE METABOLIC PANEL
ALT: 16 U/L (ref 0–35)
AST: 11 U/L (ref 0–37)
Albumin: 4.1 g/dL (ref 3.5–5.2)
Alkaline Phosphatase: 92 U/L (ref 39–117)
BUN: 15 mg/dL (ref 6–23)
CO2: 24 mEq/L (ref 19–32)
Calcium: 9 mg/dL (ref 8.4–10.5)
Chloride: 103 mEq/L (ref 96–112)
Creatinine, Ser: 0.65 mg/dL (ref 0.40–1.20)
GFR: 107.29 mL/min (ref >60.00)
Glucose, Bld: 145 mg/dL — ABNORMAL HIGH (ref 70–99)
Potassium: 3.8 mEq/L (ref 3.5–5.1)
Sodium: 135 mEq/L (ref 135–145)
Total Bilirubin: 0.4 mg/dL (ref 0.2–1.2)
Total Protein: 6.8 g/dL (ref 6.0–8.3)

## 2023-05-29 LAB — LIPID PANEL
Cholesterol: 146 mg/dL (ref 0–200)
HDL: 31.2 mg/dL — ABNORMAL LOW (ref >39.00)
LDL Cholesterol: 93 mg/dL (ref 0–99)
NonHDL: 115.12
Total CHOL/HDL Ratio: 5
Triglycerides: 113 mg/dL (ref 0.0–149.0)
VLDL: 22.6 mg/dL (ref 0.0–40.0)

## 2023-05-29 LAB — HEMOGLOBIN A1C: Hgb A1c MFr Bld: 6.6 % — ABNORMAL HIGH (ref 4.6–6.5)

## 2023-05-29 MED ORDER — ALPRAZOLAM 0.5 MG PO TABS
0.5000 mg | ORAL_TABLET | Freq: Two times a day (BID) | ORAL | 1 refills | Status: AC | PRN
Start: 2023-05-29 — End: ?
  Filled 2023-05-29: qty 30, 15d supply, fill #0

## 2023-05-29 MED ORDER — NYSTATIN 100000 UNIT/GM EX CREA
1.0000 | TOPICAL_CREAM | Freq: Two times a day (BID) | CUTANEOUS | 1 refills | Status: DC
Start: 2023-05-29 — End: 2024-01-24
  Filled 2023-05-29: qty 30, 15d supply, fill #0

## 2023-05-29 MED ORDER — FLUOXETINE HCL 20 MG PO CAPS
20.0000 mg | ORAL_CAPSULE | Freq: Every morning | ORAL | 3 refills | Status: DC
Start: 2023-05-29 — End: 2023-08-12
  Filled 2023-05-29: qty 90, 90d supply, fill #0

## 2023-05-29 NOTE — Assessment & Plan Note (Signed)
Lab Results  Component Value Date   HGBA1C 8.4 (A) 10/16/2022   Update labs today Doing well so far with Mounjaro - increase to 5 mg once weekly dose Keep working on lifestyle

## 2023-05-29 NOTE — Assessment & Plan Note (Signed)
Chronic, intermittent; on/off Prozac over the years Recently lost a close aunt - would like to start on Prozac 20 mg again. Also refilled Xanax 0.5 mg to take for acute panic as needed - states #30 will last her several years; PDMP reviewed today, no red flags, filling appropriately.

## 2023-05-29 NOTE — Progress Notes (Signed)
Subjective:    Patient ID: Andrea [name], female    DOB: 1981-03-08, 42 y.o.   MRN: [id number]  Chief Complaint  Patient presents with   Medication Refill    Pt in office for follow up and medication refills; pt is due for annual CPE and fasting labs; pt requesting A1C today but not fasting for labs, pt also c/o rash under breasts has tried several things and nothing has helped    Medication Refill   Patient is in today for med check and anxiety /depression concerns.  Past Medical History:  Diagnosis Date   Abdominal pain    Anxiety    Carpal tunnel syndrome on both sides    Diabetes mellitus without complication (HCC)    type 2    Headache    High cholesterol    Hyperlipidemia    Status post cesarean delivery 12/28/2019    Past Surgical History:  Procedure Laterality Date   CESAREAN SECTION N/A 12/28/2019   Procedure: CESAREAN SECTION;  Surgeon: Edwinna Areola, DO;  Location: [facility];  Service: Obstetrics;  Laterality: N/AHerbert Seta, RNFA   CHOLECYSTECTOMY  08/05/2011   KIDNEY STONE SURGERY     NM MYOVIEW LTD  05/07/2013   NORMAL STUDY.  Good exercise capacity with normal BP response.  No EKG changes consistent with ischemia.  Normal LV function (EF 60%) with no R WMA.  No Ischemia or Infarction --> LOW RISK    Family History  Problem Relation Age of Onset   Heart attack Mother 42   Early death Mother 68   Depression Mother    Coronary artery disease Mother 54   Hyperlipidemia Mother    Heart failure Father 66   Depression Father    Healthy Brother    Heart disease Maternal Grandmother    Miscarriages / Stillbirths Paternal Grandmother    Heart attack Paternal Grandfather 46   Early death Paternal Grandfather 7       Died of complications CAD in his 36s   Coronary artery disease Paternal Grandfather 75   Hyperlipidemia Paternal Grandfather    Coronary artery disease Maternal Uncle 42   Heart attack Maternal Uncle 28   Early death Maternal  Uncle        Died in his 52s from complications of CAD   Heart attack Paternal Aunt        During a stress test   Heart attack Paternal Uncle    Early death Paternal Uncle        Died in his 39s    Social History   Tobacco Use   Smoking status: Every Day    Current packs/day: 1.00    Average packs/day: 1 pack/day for 19.0 years (19.0 ttl pk-yrs)    Types: Cigarettes   Smokeless tobacco: Never  Vaping Use   Vaping status: Never Used  Substance Use Topics   Alcohol use: Yes    Alcohol/week: 1.0 standard drink of alcohol    Types: 1 Glasses of wine per week    Comment: very rare   Drug use: No     Allergies  Allergen Reactions   Metformin And Related Diarrhea   Wellbutrin [Bupropion] Other (See Comments)    Suicidality    Review of Systems NEGATIVE UNLESS OTHERWISE INDICATED IN HPI      Objective:     BP 118/80 (BP Location: Left Arm)   Pulse 96   Temp 98.2 F (36.8 C) (Temporal)   Ht  5\' 1"  (1.549 m)   Wt 182 lb 6.4 oz (82.7 kg)   LMP 05/11/2023 (Exact Date)   SpO2 99%   BMI 34.46 kg/m   Wt Readings from Last 3 Encounters:  05/29/23 182 lb 6.4 oz (82.7 kg)  04/19/23 185 lb (83.9 kg)  11/21/22 184 lb 12.8 oz (83.8 kg)    BP Readings from Last 3 Encounters:  05/29/23 118/80  04/19/23 (!) 142/87  11/21/22 114/64     Physical Exam Vitals and nursing note reviewed.  Constitutional:      Appearance: Normal appearance. She is obese.  Cardiovascular:     Rate and Rhythm: Normal rate and regular rhythm.     Pulses: Normal pulses.     Heart sounds: No murmur heard. Pulmonary:     Effort: Pulmonary effort is normal.     Breath sounds: Normal breath sounds.  Neurological:     General: No focal deficit present.     Mental Status: She is alert and oriented to person, place, and time.  Psychiatric:        Mood and Affect: Mood normal.        Assessment & Plan:  Type 2 diabetes mellitus with other specified complication, without long-term current use  of insulin (HCC) Assessment & Plan: Lab Results  Component Value Date   HGBA1C 8.4 (A) 10/16/2022   Update labs today Doing well so far with Mounjaro - increase to 5 mg once weekly dose Keep working on lifestyle   Orders: -     Hemoglobin A1c -     Comprehensive metabolic panel  Hyperlipidemia associated with type 2 diabetes mellitus (HCC) Assessment & Plan: Update labs today Currently on lipitor 40 mg daily   Orders: -     Lipid panel -     Comprehensive metabolic panel  Anxiety and depression Assessment & Plan: Chronic, intermittent; on/off Prozac over the years Recently lost a close aunt - would like to start on Prozac 20 mg again. Also refilled Xanax 0.5 mg to take for acute panic as needed - states #30 will last her several years; PDMP reviewed today, no red flags, filling appropriately.   Orders: -     FLUoxetine HCl; Take 1 capsule (20 mg total) by mouth every morning.  Dispense: 90 capsule; Refill: 3 -     ALPRAZolam; Take 1 tablet (0.5 mg total) by mouth 2 (two) times daily as needed for anxiety.  Dispense: 30 tablet; Refill: 1  Candidal intertrigo -     Nystatin; Apply 1 Application topically 2 (two) times daily.  Dispense: 30 g; Refill: 1       Return in about 6 months (around 11/29/2023) for recheck/follow-up.     M , PA-C

## 2023-05-29 NOTE — Assessment & Plan Note (Signed)
Update labs today Currently on lipitor 40 mg daily

## 2023-05-30 ENCOUNTER — Encounter: Payer: Self-pay | Admitting: Physician Assistant

## 2023-06-19 ENCOUNTER — Ambulatory Visit: Payer: Managed Care, Other (non HMO) | Admitting: Physician Assistant

## 2023-06-20 ENCOUNTER — Other Ambulatory Visit: Payer: Self-pay | Admitting: Physician Assistant

## 2023-06-21 ENCOUNTER — Other Ambulatory Visit (HOSPITAL_COMMUNITY): Payer: Self-pay

## 2023-06-23 ENCOUNTER — Other Ambulatory Visit (HOSPITAL_COMMUNITY): Payer: Self-pay

## 2023-06-23 MED ORDER — MOUNJARO 5 MG/0.5ML ~~LOC~~ SOAJ
5.0000 mg | SUBCUTANEOUS | 0 refills | Status: DC
  Filled 2023-06-23: qty 2, 28d supply, fill #0

## 2023-06-24 ENCOUNTER — Other Ambulatory Visit: Payer: Self-pay

## 2023-06-24 ENCOUNTER — Other Ambulatory Visit (HOSPITAL_COMMUNITY): Payer: Self-pay

## 2023-06-25 ENCOUNTER — Other Ambulatory Visit: Payer: Self-pay

## 2023-06-26 ENCOUNTER — Other Ambulatory Visit: Payer: Self-pay

## 2023-06-26 ENCOUNTER — Encounter (HOSPITAL_COMMUNITY): Payer: Self-pay

## 2023-06-26 ENCOUNTER — Other Ambulatory Visit (HOSPITAL_COMMUNITY): Payer: Self-pay

## 2023-07-04 ENCOUNTER — Other Ambulatory Visit (HOSPITAL_COMMUNITY): Payer: Self-pay

## 2023-07-27 ENCOUNTER — Other Ambulatory Visit: Payer: Self-pay | Admitting: Physician Assistant

## 2023-07-28 ENCOUNTER — Other Ambulatory Visit: Payer: Self-pay | Admitting: Physician Assistant

## 2023-07-30 ENCOUNTER — Other Ambulatory Visit (HOSPITAL_COMMUNITY): Payer: Self-pay

## 2023-07-30 ENCOUNTER — Other Ambulatory Visit: Payer: Self-pay | Admitting: Physician Assistant

## 2023-07-30 NOTE — Telephone Encounter (Signed)
Patient called in to see why an appointment was needed due to seeing PCP for this on 05/29/23. Per AVS from that visit patient was instructed to f/u in 6 months (around 11/29/23). Please Advise.

## 2023-07-31 ENCOUNTER — Other Ambulatory Visit: Payer: Self-pay

## 2023-07-31 MED ORDER — MOUNJARO 5 MG/0.5ML ~~LOC~~ SOAJ
5.0000 mg | SUBCUTANEOUS | 0 refills | Status: DC
  Filled 2023-07-31: qty 2, 28d supply, fill #0

## 2023-08-02 ENCOUNTER — Encounter: Payer: Self-pay | Admitting: Physician Assistant

## 2023-08-02 ENCOUNTER — Other Ambulatory Visit (HOSPITAL_COMMUNITY): Payer: Self-pay

## 2023-08-04 NOTE — Telephone Encounter (Signed)
Please see patient message and reply to patient as why appointment is needed.

## 2023-08-05 ENCOUNTER — Telehealth: Payer: Self-pay

## 2023-08-05 ENCOUNTER — Other Ambulatory Visit (HOSPITAL_COMMUNITY): Payer: Self-pay

## 2023-08-05 NOTE — Telephone Encounter (Signed)
Pharmacy Patient Advocate Encounter   Received notification from Pt Calls Messages that prior authorization for Mounjaro 5mg /0.39ml is required/requested.   Insurance verification completed.   The patient is insured through [facility] .   Per test claim: PA required; PA submitted to [facility] via CoverMyMeds Key/confirmation #/EOC [id number] Status is pending

## 2023-08-05 NOTE — Telephone Encounter (Signed)
Any update on this patient PA for [facility]. Pt been with out medication for 2 weeks per Mychart message

## 2023-08-05 NOTE — Telephone Encounter (Signed)
Patient is calling due to her medication not being filled and PA not been completed. Any update from insurance on approval? Pt has diabetes and now 2 weeks without medication. Please advise

## 2023-08-06 ENCOUNTER — Other Ambulatory Visit (HOSPITAL_COMMUNITY): Payer: Self-pay

## 2023-08-06 NOTE — Telephone Encounter (Signed)
Called pt and advised via lvm with cb number.

## 2023-08-06 NOTE — Telephone Encounter (Signed)
Pharmacy Patient Advocate Encounter  Received notification from [facility] that Prior Authorization for Mounjaro 5mg /0.32ml has been APPROVED from 08/05/23 to 08/03/26. Ran test claim, Copay is $30. This test claim was processed through [facility]- copay amounts may vary at other pharmacies due to pharmacy/plan contracts, or as the patient moves through the different stages of their insurance plan.   PA #/Case ID/Reference #: [id number]  Spoke with [facility] to notify of the approval and they processed the order and it is set to be delivered.

## 2023-08-06 NOTE — Telephone Encounter (Signed)
  PA approved; Called pt and advised via lvm with cb number.

## 2023-08-12 ENCOUNTER — Other Ambulatory Visit (HOSPITAL_COMMUNITY): Payer: Self-pay

## 2023-08-12 ENCOUNTER — Other Ambulatory Visit: Payer: Self-pay

## 2023-08-12 ENCOUNTER — Encounter: Payer: Self-pay | Admitting: Physician Assistant

## 2023-08-12 ENCOUNTER — Ambulatory Visit: Payer: BC Managed Care – PPO | Admitting: Physician Assistant

## 2023-08-12 VITALS — BP 111/76 | HR 92 | Temp 98.0°F | Ht 61.0 in | Wt 178.1 lb

## 2023-08-12 DIAGNOSIS — Z716 Tobacco abuse counseling: Secondary | ICD-10-CM

## 2023-08-12 DIAGNOSIS — Z720 Tobacco use: Secondary | ICD-10-CM

## 2023-08-12 DIAGNOSIS — R21 Rash and other nonspecific skin eruption: Secondary | ICD-10-CM | POA: Diagnosis not present

## 2023-08-12 MED ORDER — TRIAMCINOLONE ACETONIDE 0.1 % EX CREA
1.0000 | TOPICAL_CREAM | Freq: Three times a day (TID) | CUTANEOUS | 0 refills | Status: DC
Start: 2023-08-12 — End: 2024-01-24
  Filled 2023-08-12: qty 45, 15d supply, fill #0

## 2023-08-12 NOTE — Progress Notes (Signed)
Subjective:    Patient ID: Andrea [name], female    DOB: March 31, 1981, 42 y.o.   MRN: [id number]  Chief Complaint  Patient presents with   Nicotine Dependence   Rash    Pt c/o rash on abdomen, neck and right side, Has tried nystatin which did not help. Has tried at home remedies which did help but came back worse.     HPI Discussed the use of AI scribe software for clinical note transcription with the patient, who gave verbal consent to proceed.  History of Present Illness   The patient, with a history of smoking, presents for smoking cessation counseling. The patient is a smoker and has attempted to quit in the past using Wellbutrin and Chantix. The patient expresses a desire to quit smoking but acknowledges not being ready to make that commitment due to current stress levels associated with a new job.   The patient's primary concern is a recurring rash that has been resistant to previous treatments, including nystatin and a combination of essential oil and hydrocortisone cream. The rash initially appeared under the patient's breasts and resolved with treatment but returned more extensively, spreading to the stomach and previously the neck. The rash is itchy and causes significant discomfort. The patient also reports a dry patch on the R hip.  Past Medical History:  Diagnosis Date   Abdominal pain    Anxiety    Carpal tunnel syndrome on both sides    Diabetes mellitus without complication (HCC)    type 2    Headache    High cholesterol    Hyperlipidemia    Status post cesarean delivery 12/28/2019    Past Surgical History:  Procedure Laterality Date   CESAREAN SECTION N/A 12/28/2019   Procedure: CESAREAN SECTION;  Surgeon: Edwinna Areola, DO;  Location: [facility] LD [facility]S;  Service: Obstetrics;  Laterality: N/AHerbert Seta, RNFA   CHOLECYSTECTOMY  08/05/2011   KIDNEY STONE SURGERY     NM MYOVIEW LTD  05/07/2013   NORMAL STUDY.  Good exercise capacity with normal BP  response.  No EKG changes consistent with ischemia.  Normal LV function (EF 60%) with no R WMA.  No Ischemia or Infarction --> LOW RISK    Family History  Problem Relation Age of Onset   Heart attack Mother 32   Early death Mother 66   Depression Mother    Coronary artery disease Mother 49   Hyperlipidemia Mother    Heart failure Father 15   Depression Father    Healthy Brother    Heart disease Maternal Grandmother    Miscarriages / Stillbirths Paternal Grandmother    Heart attack Paternal Grandfather 33   Early death Paternal Grandfather 59       Died of complications CAD in his 63s   Coronary artery disease Paternal Grandfather 42   Hyperlipidemia Paternal Grandfather    Coronary artery disease Maternal Uncle 42   Heart attack Maternal Uncle 90   Early death Maternal Uncle        Died in his 26s from complications of CAD   Heart attack Paternal Aunt        During a stress test   Heart attack Paternal Uncle    Early death Paternal Uncle        Died in his 67s    Social History   Tobacco Use   Smoking status: Every Day    Current packs/day: 1.00    Average packs/day: 1 pack/day for  19.0 years (19.0 ttl pk-yrs)    Types: Cigarettes   Smokeless tobacco: Never  Vaping Use   Vaping status: Never Used  Substance Use Topics   Alcohol use: Yes    Alcohol/week: 1.0 standard drink of alcohol    Types: 1 Glasses of wine per week    Comment: very rare   Drug use: No     Allergies  Allergen Reactions   Metformin And Related Diarrhea   Wellbutrin [Bupropion] Other (See Comments)    Suicidality    Review of Systems NEGATIVE UNLESS OTHERWISE INDICATED IN HPI      Objective:     BP 111/76 (BP Location: Left Arm, Patient Position: Sitting, Cuff Size: Normal)   Pulse 92   Temp 98 F (36.7 C) (Temporal)   Ht 5\' 1"  (1.549 m)   Wt 178 lb 2 oz (80.8 kg)   LMP 07/18/2023 (Approximate)   SpO2 98%   BMI 33.66 kg/m   Wt Readings from Last 3 Encounters:  08/12/23 178  lb 2 oz (80.8 kg)  05/29/23 182 lb 6.4 oz (82.7 kg)  04/19/23 185 lb (83.9 kg)    BP Readings from Last 3 Encounters:  08/12/23 111/76  05/29/23 118/80  04/19/23 (!) 142/87     Physical Exam Vitals and nursing note reviewed.  Constitutional:      Appearance: Normal appearance.  Cardiovascular:     Rate and Rhythm: Normal rate and regular rhythm.  Pulmonary:     Effort: Pulmonary effort is normal.  Skin:    Findings: Rash (dry patch R hip, evidence of excoriation lower abdomen, no acute rash identified today) present.  Neurological:     General: No focal deficit present.     Mental Status: She is alert.  Psychiatric:        Mood and Affect: Mood normal.        Behavior: Behavior normal.        Assessment & Plan:  Encounter for smoking cessation counseling  Tobacco abuse  Rash and nonspecific skin eruption -     Triamcinolone Acetonide; Apply 1 Application topically 3 (three) times daily. Apply to affected areas no longer than 2 weeks consistently.  Dispense: 45 g; Refill: 0    Assessment and Plan    Recurrent Skin Rash Persistent, itchy rash on abdomen and hip, not responsive to Nystatin or over-the-counter hydrocortisone cream. No clear triggers identified. -Prescribe Triamcinolone cream, apply to affected areas three times a day for no longer than two weeks. -Advise patient to use simple, non-fragrant soaps and moisturize skin after hot showers or baths.  Tobacco Use Patient currently smoking half a pack a day, has expressed desire to quit in the future but not ready at this time. Previous attempts with Wellbutrin and Chantix, latter was effective but caused nausea. -Plan to revisit smoking cessation discussion in the summer. -Consider Chantix as a potential aid for cessation, with possible addition of Zofran to manage nausea.  Follow-up in February for six-month check-up and lab work.        Shamya Macfadden M Mara Favero, PA-C

## 2023-08-12 NOTE — Patient Instructions (Signed)
YOUR PLAN:  -RECURRENT SKIN RASH: A recurrent skin rash is a skin condition that keeps coming back and can be itchy and uncomfortable. We will treat it with Triamcinolone cream, which you should apply to the affected areas three times a day for no longer than two weeks. Additionally, use simple, non-fragrant soaps and moisturize your skin after hot showers or baths.  -TOBACCO USE: Tobacco use refers to the habit of smoking cigarettes. You are currently smoking half a pack a day and have expressed a desire to quit in the future. We will revisit the discussion about quitting smoking in the summer. Chantix, which you have used before, may be considered again, possibly with Zofran to manage any nausea.  INSTRUCTIONS:  Please follow up in February for your six-month check-up and lab work.

## 2023-08-31 ENCOUNTER — Other Ambulatory Visit: Payer: Self-pay | Admitting: Physician Assistant

## 2023-09-01 ENCOUNTER — Other Ambulatory Visit: Payer: Self-pay

## 2023-09-01 MED ORDER — MOUNJARO 5 MG/0.5ML ~~LOC~~ SOAJ
5.0000 mg | SUBCUTANEOUS | 0 refills | Status: DC
  Filled 2023-09-01: qty 2, 28d supply, fill #0

## 2023-09-02 ENCOUNTER — Other Ambulatory Visit (HOSPITAL_COMMUNITY): Payer: Self-pay

## 2023-09-17 ENCOUNTER — Encounter: Payer: Self-pay | Admitting: Physician Assistant

## 2023-09-18 ENCOUNTER — Other Ambulatory Visit: Payer: Self-pay

## 2023-09-18 ENCOUNTER — Other Ambulatory Visit (HOSPITAL_COMMUNITY): Payer: Self-pay

## 2023-09-18 DIAGNOSIS — E1169 Type 2 diabetes mellitus with other specified complication: Secondary | ICD-10-CM

## 2023-09-18 MED ORDER — TIRZEPATIDE 7.5 MG/0.5ML ~~LOC~~ SOAJ
7.5000 mg | SUBCUTANEOUS | 0 refills | Status: DC
Start: 2023-09-18 — End: 2024-01-24
  Filled 2023-09-18: qty 2, 28d supply, fill #0
  Filled 2023-10-03 (×3): qty 6, 84d supply, fill #0

## 2023-09-18 NOTE — Telephone Encounter (Signed)
Please see pt msg and advise on increased dose

## 2023-10-03 ENCOUNTER — Other Ambulatory Visit (HOSPITAL_COMMUNITY): Payer: Self-pay

## 2023-10-03 ENCOUNTER — Other Ambulatory Visit: Payer: Self-pay | Admitting: Physician Assistant

## 2023-10-04 ENCOUNTER — Other Ambulatory Visit (HOSPITAL_COMMUNITY): Payer: Self-pay

## 2023-10-06 ENCOUNTER — Other Ambulatory Visit (HOSPITAL_COMMUNITY): Payer: Self-pay

## 2023-10-06 MED ORDER — ATORVASTATIN CALCIUM 40 MG PO TABS
40.0000 mg | ORAL_TABLET | Freq: Every day | ORAL | 6 refills | Status: AC
  Filled 2023-10-06 – 2023-10-07 (×2): qty 30, 30d supply, fill #0
  Filled 2023-12-29: qty 30, 30d supply, fill #1
  Filled 2024-01-24 – 2024-01-28 (×4): qty 30, 30d supply, fill #2
  Filled 2024-07-24: qty 30, 30d supply, fill #3

## 2023-10-07 ENCOUNTER — Other Ambulatory Visit (HOSPITAL_COMMUNITY): Payer: Self-pay

## 2023-10-07 ENCOUNTER — Other Ambulatory Visit (HOSPITAL_BASED_OUTPATIENT_CLINIC_OR_DEPARTMENT_OTHER): Payer: Self-pay

## 2023-10-27 ENCOUNTER — Other Ambulatory Visit (HOSPITAL_COMMUNITY): Payer: Self-pay

## 2023-11-21 ENCOUNTER — Other Ambulatory Visit (HOSPITAL_COMMUNITY): Payer: Self-pay

## 2023-12-02 ENCOUNTER — Ambulatory Visit: Payer: Self-pay | Admitting: Physician Assistant

## 2023-12-08 ENCOUNTER — Other Ambulatory Visit (HOSPITAL_COMMUNITY): Payer: Self-pay

## 2023-12-08 MED ORDER — FLUCONAZOLE 150 MG PO TABS
ORAL_TABLET | ORAL | 0 refills | Status: DC
  Filled 2023-12-08: qty 2, 1d supply, fill #0
  Filled 2023-12-08: qty 2, 3d supply, fill #0

## 2023-12-08 MED ORDER — AMOXICILLIN 875 MG PO TABS
875.0000 mg | ORAL_TABLET | Freq: Two times a day (BID) | ORAL | 0 refills | Status: DC
  Filled 2023-12-08 (×2): qty 20, 10d supply, fill #0

## 2023-12-08 MED ORDER — OSELTAMIVIR PHOSPHATE 75 MG PO CAPS
75.0000 mg | ORAL_CAPSULE | Freq: Two times a day (BID) | ORAL | 0 refills | Status: DC
  Filled 2023-12-08: qty 10, 5d supply, fill #0

## 2023-12-24 ENCOUNTER — Other Ambulatory Visit (HOSPITAL_COMMUNITY): Payer: Self-pay

## 2023-12-29 ENCOUNTER — Other Ambulatory Visit (HOSPITAL_COMMUNITY): Payer: Self-pay

## 2023-12-29 ENCOUNTER — Other Ambulatory Visit: Payer: Self-pay

## 2023-12-29 MED ORDER — MOUNJARO 10 MG/0.5ML ~~LOC~~ SOAJ
10.0000 mg | SUBCUTANEOUS | 0 refills | Status: DC
  Filled 2023-12-29: qty 2, 28d supply, fill #0

## 2024-01-02 ENCOUNTER — Other Ambulatory Visit: Payer: Self-pay

## 2024-01-02 ENCOUNTER — Other Ambulatory Visit (HOSPITAL_COMMUNITY): Payer: Self-pay

## 2024-01-02 MED ORDER — CEPHALEXIN 500 MG PO CAPS
500.0000 mg | ORAL_CAPSULE | Freq: Four times a day (QID) | ORAL | 0 refills | Status: DC
  Filled 2024-01-02: qty 40, 10d supply, fill #0

## 2024-01-02 MED ORDER — TOBRAMYCIN 0.3 % OP SOLN
1.0000 [drp] | OPHTHALMIC | 0 refills | Status: AC
  Filled 2024-01-02 (×2): qty 5, 17d supply, fill #0

## 2024-01-02 MED ORDER — FLUCONAZOLE 150 MG PO TABS
150.0000 mg | ORAL_TABLET | ORAL | 0 refills | Status: DC
  Filled 2024-01-02: qty 2, 6d supply, fill #0

## 2024-01-05 ENCOUNTER — Other Ambulatory Visit: Payer: Self-pay

## 2024-01-06 ENCOUNTER — Other Ambulatory Visit: Payer: Self-pay

## 2024-01-06 ENCOUNTER — Other Ambulatory Visit (HOSPITAL_COMMUNITY): Payer: Self-pay

## 2024-01-06 MED ORDER — TRIAMCINOLONE ACETONIDE 0.1 % EX CREA
TOPICAL_CREAM | CUTANEOUS | 1 refills | Status: DC
  Filled 2024-01-06: qty 80, 30d supply, fill #0

## 2024-01-14 ENCOUNTER — Other Ambulatory Visit (HOSPITAL_COMMUNITY): Payer: Self-pay

## 2024-01-15 ENCOUNTER — Encounter: Payer: Self-pay | Admitting: Physician Assistant

## 2024-01-16 ENCOUNTER — Other Ambulatory Visit (HOSPITAL_COMMUNITY): Payer: Self-pay

## 2024-01-24 ENCOUNTER — Other Ambulatory Visit (HOSPITAL_COMMUNITY): Payer: Self-pay

## 2024-01-24 ENCOUNTER — Telehealth: Admitting: Family

## 2024-01-24 ENCOUNTER — Encounter

## 2024-01-24 DIAGNOSIS — J069 Acute upper respiratory infection, unspecified: Secondary | ICD-10-CM | POA: Diagnosis not present

## 2024-01-24 DIAGNOSIS — Z20822 Contact with and (suspected) exposure to covid-19: Secondary | ICD-10-CM | POA: Diagnosis not present

## 2024-01-24 NOTE — Progress Notes (Signed)
 Virtual Visit Consent   Andrea [name], you are scheduled for a virtual visit with a [facility] provider today. Just as with appointments in the office, your consent must be obtained to participate. Your consent will be active for this visit and any virtual visit you may have with one of our providers in the next 365 days. If you have a MyChart account, a copy of this consent can be sent to you electronically.  As this is a virtual visit, video technology does not allow for your provider to perform a traditional examination. This may limit your provider's ability to fully assess your condition. If your provider identifies any concerns that need to be evaluated in person or the need to arrange testing (such as labs, EKG, etc.), we will make arrangements to do so. Although advances in technology are sophisticated, we cannot ensure that it will always work on either your end or our end. If the connection with a video visit is poor, the visit may have to be switched to a telephone visit. With either a video or telephone visit, we are not always able to ensure that we have a secure connection.  By engaging in this virtual visit, you consent to the provision of healthcare and authorize for your insurance to be billed (if applicable) for the services provided during this visit. Depending on your insurance coverage, you may receive a charge related to this service.  I need to obtain your verbal consent now. Are you willing to proceed with your visit today? Andrea [name] has provided verbal consent on 01/24/2024 for a virtual visit (video or telephone). Tommas Fragmin, FNP  Date: 01/24/2024 5:20 PM   Virtual Visit via Video Note   I, Tommas Fragmin, connected with  Andrea [name]  ([id number], 06/27/1981) on 01/24/24 at  5:15 PM EDT by a video-enabled telemedicine application and verified that I am speaking with the correct person using two identifiers.  Location: Patient: Virtual Visit  Location Patient: Home Provider: Virtual Visit Location Provider: Home Office   I discussed the limitations of evaluation and management by telemedicine and the availability of in person appointments. The patient expressed understanding and agreed to proceed.    History of Present Illness: Andrea [name] is a 43 y.o. who identifies as a female who was assigned female at birth, and is being seen today for COVID. Reports she was exposed to COVID 01/21/24 and she started having symptoms 01/23/24.  HPI: URI  This is a new problem. The current episode started yesterday. The problem has been unchanged. The maximum temperature recorded prior to her arrival was 100.4 - 100.9 F. Associated symptoms include congestion, coughing, headaches, rhinorrhea, sneezing and a sore throat. Pertinent negatives include no ear pain, nausea, sinus pain or vomiting. She has tried decongestant, acetaminophen and increased fluids for the symptoms. The treatment provided mild relief.    Problems:  Patient Active Problem List   Diagnosis Date Noted   Anxiety and depression 05/29/2023   Hyperlipidemia associated with type 2 diabetes mellitus (HCC) 10/29/2022   Depression, major, single episode, mild (HCC) 03/23/2018   COPD (chronic obstructive pulmonary disease) (HCC) 06/18/2017   Carpal tunnel syndrome 06/18/2017   Eczema, dyshidrotic 07/29/2016   GAD (generalized anxiety disorder) 02/22/2016   Diabetes mellitus (HCC) 02/22/2016   Left chest pressure 05/02/2013   Obstructive sleep apnea 05/02/2013   Obesity 05/02/2013   Tobacco abuse 05/02/2013    Allergies:  Allergies  Allergen Reactions   Metformin And Related  Diarrhea   Wellbutrin [Bupropion] Other (See Comments)    Suicidality   Medications:  Current Outpatient Medications:    ALPRAZolam (XANAX) 0.5 MG tablet, Take 1 tablet (0.5 mg total) by mouth 2 (two) times daily as needed for anxiety., Disp: 30 tablet, Rfl: 1   atorvastatin (LIPITOR) 40 MG  tablet, Take 1 tablet (40 mg total) by mouth daily., Disp: 30 tablet, Rfl: 6   blood glucose meter kit and supplies KIT, Use up to four times daily as directed., Disp: 1 each, Rfl: 11   glucose blood (FREESTYLE LITE) test strip, Use as directed to test blood sugar up to four times daily., Disp: 100 each, Rfl: 11   Lancets (FREESTYLE) lancets, Use to test blood sugar up to 4 times daily as directed., Disp: 100 each, Rfl: 11   tirzepatide (MOUNJARO) 10 MG/0.5ML Pen, Inject 10 mg into the skin once a week., Disp: 2 mL, Rfl: 0   triamcinolone cream (KENALOG) 0.1 %, Apply one gram to affected area 3 (three) times a day., Disp: 80 g, Rfl: 1  Observations/Objective: Patient is well-developed, well-nourished in no acute distress.  Resting comfortably  at home.  Head is normocephalic, atraumatic.  No labored breathing.  Speech is clear and coherent with logical content.  Patient is alert and oriented at baseline.  Nasal congestion  Dry cough  Assessment and Plan: 1. Exposure to COVID-19 virus (Primary)  2. Upper respiratory tract infection, unspecified type  Rest, force fluids, tylenol as needed, Quarantine for at least 5 days, report any worsening symptoms such as increased shortness of breath, swelling, or continued high fevers. Follow up if symptoms worsen or do not improve. Note given.   Follow Up Instructions: I discussed the assessment and treatment plan with the patient. The patient was provided an opportunity to ask questions and all were answered. The patient agreed with the plan and demonstrated an understanding of the instructions.  A copy of instructions were sent to the patient via MyChart unless otherwise noted below.     The patient was advised to call back or seek an in-person evaluation if the symptoms worsen or if the condition fails to improve as anticipated.    Tommas Fragmin, FNP

## 2024-01-26 ENCOUNTER — Other Ambulatory Visit (HOSPITAL_COMMUNITY): Payer: Self-pay

## 2024-01-26 MED ORDER — MOUNJARO 10 MG/0.5ML ~~LOC~~ SOAJ
SUBCUTANEOUS | 0 refills | Status: DC
  Filled 2024-01-26 – 2024-01-28 (×2): qty 2, 28d supply, fill #0

## 2024-01-28 ENCOUNTER — Other Ambulatory Visit: Payer: Self-pay

## 2024-01-28 ENCOUNTER — Other Ambulatory Visit (HOSPITAL_COMMUNITY): Payer: Self-pay

## 2024-02-10 ENCOUNTER — Other Ambulatory Visit (HOSPITAL_COMMUNITY): Payer: Self-pay

## 2024-02-10 MED ORDER — OXYCODONE-ACETAMINOPHEN 5-325 MG PO TABS
1.0000 | ORAL_TABLET | Freq: Four times a day (QID) | ORAL | 0 refills | Status: AC
  Filled 2024-02-10: qty 5, 2d supply, fill #0

## 2024-02-10 MED ORDER — IBUPROFEN 800 MG PO TABS
800.0000 mg | ORAL_TABLET | Freq: Three times a day (TID) | ORAL | 1 refills | Status: AC
  Filled 2024-02-10: qty 30, 10d supply, fill #0

## 2024-02-23 ENCOUNTER — Other Ambulatory Visit (HOSPITAL_COMMUNITY): Payer: Self-pay

## 2024-02-23 MED ORDER — DOXYCYCLINE HYCLATE 100 MG PO TABS
100.0000 mg | ORAL_TABLET | Freq: Two times a day (BID) | ORAL | 0 refills | Status: AC
  Filled 2024-02-23 (×2): qty 14, 7d supply, fill #0

## 2024-02-23 MED ORDER — AMOXICILLIN-POT CLAVULANATE 875-125 MG PO TABS
1.0000 | ORAL_TABLET | Freq: Two times a day (BID) | ORAL | 0 refills | Status: AC
  Filled 2024-02-23 (×2): qty 14, 7d supply, fill #0

## 2024-02-23 MED ORDER — METRONIDAZOLE 500 MG PO TABS
500.0000 mg | ORAL_TABLET | Freq: Two times a day (BID) | ORAL | 0 refills | Status: AC
  Filled 2024-02-23 (×2): qty 14, 7d supply, fill #0

## 2024-02-25 ENCOUNTER — Other Ambulatory Visit: Payer: Self-pay

## 2024-02-25 ENCOUNTER — Other Ambulatory Visit (HOSPITAL_COMMUNITY): Payer: Self-pay

## 2024-02-25 MED ORDER — FLUCONAZOLE 150 MG PO TABS
150.0000 mg | ORAL_TABLET | Freq: Every day | ORAL | 0 refills | Status: AC
  Filled 2024-02-25: qty 3, 9d supply, fill #0

## 2024-02-27 ENCOUNTER — Other Ambulatory Visit (HOSPITAL_COMMUNITY): Payer: Self-pay

## 2024-03-01 ENCOUNTER — Other Ambulatory Visit (HOSPITAL_COMMUNITY): Payer: Self-pay

## 2024-03-02 ENCOUNTER — Other Ambulatory Visit: Payer: Self-pay

## 2024-03-02 ENCOUNTER — Other Ambulatory Visit (HOSPITAL_COMMUNITY): Payer: Self-pay

## 2024-03-02 MED ORDER — MOUNJARO 10 MG/0.5ML ~~LOC~~ SOAJ
10.0000 mg | SUBCUTANEOUS | 0 refills | Status: DC
  Filled 2024-03-02: qty 2, 28d supply, fill #0

## 2024-03-27 ENCOUNTER — Other Ambulatory Visit (HOSPITAL_COMMUNITY): Payer: Self-pay

## 2024-03-29 ENCOUNTER — Other Ambulatory Visit (HOSPITAL_COMMUNITY): Payer: Self-pay

## 2024-03-29 ENCOUNTER — Other Ambulatory Visit: Payer: Self-pay

## 2024-03-29 MED ORDER — MOUNJARO 10 MG/0.5ML ~~LOC~~ SOAJ
SUBCUTANEOUS | 0 refills | Status: DC
  Filled 2024-03-29: qty 2, 28d supply, fill #0

## 2024-04-05 ENCOUNTER — Other Ambulatory Visit (HOSPITAL_COMMUNITY): Payer: Self-pay

## 2024-04-05 ENCOUNTER — Other Ambulatory Visit: Payer: Self-pay

## 2024-04-05 MED ORDER — VENLAFAXINE HCL ER 37.5 MG PO CP24
37.5000 mg | ORAL_CAPSULE | Freq: Every day | ORAL | 2 refills | Status: AC
  Filled 2024-04-05: qty 30, 30d supply, fill #0

## 2024-04-26 ENCOUNTER — Other Ambulatory Visit: Payer: Self-pay

## 2024-04-26 ENCOUNTER — Other Ambulatory Visit (HOSPITAL_COMMUNITY): Payer: Self-pay

## 2024-04-26 MED ORDER — MOUNJARO 10 MG/0.5ML ~~LOC~~ SOAJ
10.0000 mg | SUBCUTANEOUS | 0 refills | Status: DC
  Filled 2024-04-26: qty 2, 28d supply, fill #0

## 2024-05-27 ENCOUNTER — Other Ambulatory Visit (HOSPITAL_COMMUNITY): Payer: Self-pay

## 2024-05-27 ENCOUNTER — Other Ambulatory Visit: Payer: Self-pay

## 2024-05-27 MED ORDER — MOUNJARO 10 MG/0.5ML ~~LOC~~ SOAJ
10.0000 mg | SUBCUTANEOUS | 0 refills | Status: DC
  Filled 2024-05-27 – 2024-05-28 (×4): qty 2, 28d supply, fill #0

## 2024-05-28 ENCOUNTER — Other Ambulatory Visit: Payer: Self-pay

## 2024-05-28 ENCOUNTER — Other Ambulatory Visit (HOSPITAL_BASED_OUTPATIENT_CLINIC_OR_DEPARTMENT_OTHER): Payer: Self-pay

## 2024-06-29 ENCOUNTER — Other Ambulatory Visit (HOSPITAL_COMMUNITY): Payer: Self-pay

## 2024-06-29 MED ORDER — MOUNJARO 10 MG/0.5ML ~~LOC~~ SOAJ
10.0000 mg | SUBCUTANEOUS | 0 refills | Status: DC
  Filled 2024-06-29: qty 2, 28d supply, fill #0

## 2024-06-30 ENCOUNTER — Other Ambulatory Visit (HOSPITAL_COMMUNITY): Payer: Self-pay

## 2024-07-24 ENCOUNTER — Other Ambulatory Visit (HOSPITAL_COMMUNITY): Payer: Self-pay

## 2024-07-27 ENCOUNTER — Other Ambulatory Visit (HOSPITAL_COMMUNITY): Payer: Self-pay

## 2024-07-27 ENCOUNTER — Other Ambulatory Visit: Payer: Self-pay

## 2024-07-27 MED ORDER — MOUNJARO 10 MG/0.5ML ~~LOC~~ SOAJ
10.0000 mg | SUBCUTANEOUS | 0 refills | Status: DC
  Filled 2024-07-27: qty 2, 28d supply, fill #0

## 2024-07-27 MED ORDER — ATORVASTATIN CALCIUM 40 MG PO TABS: 40.0000 mg | ORAL_TABLET | Freq: Every day | ORAL | 0 refills | Status: DC

## 2024-08-24 ENCOUNTER — Other Ambulatory Visit (HOSPITAL_COMMUNITY): Payer: Self-pay

## 2024-08-24 ENCOUNTER — Other Ambulatory Visit: Payer: Self-pay

## 2024-08-24 MED ORDER — MOUNJARO 10 MG/0.5ML ~~LOC~~ SOAJ
0.5000 mL | SUBCUTANEOUS | 0 refills | Status: DC
  Filled 2024-08-24: qty 2, 28d supply, fill #0

## 2024-09-08 ENCOUNTER — Other Ambulatory Visit: Payer: Self-pay

## 2024-09-08 ENCOUNTER — Other Ambulatory Visit (HOSPITAL_COMMUNITY): Payer: Self-pay

## 2024-09-08 MED ORDER — MOUNJARO 10 MG/0.5ML ~~LOC~~ SOAJ
10.0000 mg | SUBCUTANEOUS | 3 refills | Status: AC
  Filled 2024-09-08: qty 2, 28d supply, fill #0

## 2024-09-08 MED ORDER — ALPRAZOLAM 0.5 MG PO TABS
0.2500 mg | ORAL_TABLET | Freq: Every day | ORAL | 0 refills | Status: AC | PRN
  Filled 2024-09-08: qty 10, 20d supply, fill #0

## 2024-09-22 ENCOUNTER — Other Ambulatory Visit (HOSPITAL_COMMUNITY): Payer: Self-pay

## 2024-09-22 MED ORDER — MOUNJARO 10 MG/0.5ML ~~LOC~~ SOAJ
10.0000 mg | SUBCUTANEOUS | 3 refills | Status: AC
  Filled 2024-09-22: qty 2, 28d supply, fill #0

## 2024-10-04 ENCOUNTER — Other Ambulatory Visit (HOSPITAL_COMMUNITY): Payer: Self-pay

## 2024-10-06 ENCOUNTER — Other Ambulatory Visit (HOSPITAL_COMMUNITY): Payer: Self-pay

## 2024-10-13 ENCOUNTER — Other Ambulatory Visit (HOSPITAL_COMMUNITY): Payer: Self-pay

## 2024-10-16 ENCOUNTER — Other Ambulatory Visit (HOSPITAL_COMMUNITY): Payer: Self-pay

## 2024-10-18 ENCOUNTER — Other Ambulatory Visit (HOSPITAL_COMMUNITY): Payer: Self-pay

## 2024-10-18 MED ORDER — MOUNJARO 10 MG/0.5ML ~~LOC~~ SOAJ
10.0000 mg | SUBCUTANEOUS | 3 refills | Status: AC
  Filled 2024-10-18: qty 2, 28d supply, fill #0

## 2024-10-19 ENCOUNTER — Other Ambulatory Visit: Payer: Self-pay

## 2024-11-11 ENCOUNTER — Other Ambulatory Visit (HOSPITAL_BASED_OUTPATIENT_CLINIC_OR_DEPARTMENT_OTHER): Payer: Self-pay

## 2024-11-11 MED ORDER — MOUNJARO 10 MG/0.5ML ~~LOC~~ SOAJ
10.0000 mg | SUBCUTANEOUS | 1 refills | Status: AC
  Filled 2024-11-11: qty 6, 84d supply, fill #0

## 2024-11-15 ENCOUNTER — Encounter: Payer: Self-pay | Admitting: Gastroenterology

## 2024-11-15 ENCOUNTER — Other Ambulatory Visit (HOSPITAL_COMMUNITY): Payer: Self-pay

## 2024-11-15 MED ORDER — MOUNJARO 10 MG/0.5ML ~~LOC~~ SOAJ
10.0000 mg | SUBCUTANEOUS | 1 refills | Status: AC
  Filled 2024-11-15: qty 6, 84d supply, fill #0
